# Patient Record
Sex: Female | Born: 1945
Health system: Southern US, Community
[De-identification: ages and names within clinical notes are randomized; demographics above are authoritative.]

## PROBLEM LIST (undated history)

## (undated) DIAGNOSIS — Z5189 Encounter for other specified aftercare: Secondary | ICD-10-CM

## (undated) DIAGNOSIS — J302 Other seasonal allergic rhinitis: Secondary | ICD-10-CM

## (undated) DIAGNOSIS — I350 Nonrheumatic aortic (valve) stenosis: Secondary | ICD-10-CM

## (undated) DIAGNOSIS — T7840XA Allergy, unspecified, initial encounter: Secondary | ICD-10-CM

## (undated) DIAGNOSIS — Z952 Presence of prosthetic heart valve: Secondary | ICD-10-CM

## (undated) DIAGNOSIS — M1990 Unspecified osteoarthritis, unspecified site: Secondary | ICD-10-CM

## (undated) DIAGNOSIS — I442 Atrioventricular block, complete: Secondary | ICD-10-CM

## (undated) DIAGNOSIS — G43909 Migraine, unspecified, not intractable, without status migrainosus: Secondary | ICD-10-CM

## (undated) DIAGNOSIS — E785 Hyperlipidemia, unspecified: Secondary | ICD-10-CM

## (undated) DIAGNOSIS — Q231 Congenital insufficiency of aortic valve: Secondary | ICD-10-CM

## (undated) DIAGNOSIS — M8580 Other specified disorders of bone density and structure, unspecified site: Secondary | ICD-10-CM

## (undated) DIAGNOSIS — R011 Cardiac murmur, unspecified: Secondary | ICD-10-CM

## (undated) DIAGNOSIS — Z950 Presence of cardiac pacemaker: Secondary | ICD-10-CM

## (undated) HISTORY — DX: Other seasonal allergic rhinitis: J30.2

## (undated) HISTORY — PX: POLYPECTOMY: SHX149

## (undated) HISTORY — DX: Congenital insufficiency of aortic valve: Q23.1

## (undated) HISTORY — DX: Other specified disorders of bone density and structure, unspecified site: M85.80

## (undated) HISTORY — DX: Atrioventricular block, complete: I44.2

## (undated) HISTORY — PX: INSERT / REPLACE / REMOVE PACEMAKER: SUR710

## (undated) HISTORY — PX: ABDOMINAL HYSTERECTOMY: SHX81

## (undated) HISTORY — DX: Hyperlipidemia, unspecified: E78.5

## (undated) HISTORY — DX: Allergy, unspecified, initial encounter: T78.40XA

## (undated) HISTORY — DX: Nonrheumatic aortic (valve) stenosis: I35.0

## (undated) HISTORY — DX: Encounter for other specified aftercare: Z51.89

## (undated) HISTORY — PX: KNEE ARTHROSCOPY: SHX127

---
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
## 2003-03-30 HISTORY — PX: COLONOSCOPY: SHX174

## 2003-06-13 LAB — HM COLONOSCOPY

## 2008-03-29 HISTORY — PX: CARDIAC VALVE REPLACEMENT: SHX585

## 2008-09-26 DIAGNOSIS — Q231 Congenital insufficiency of aortic valve: Secondary | ICD-10-CM

## 2008-09-26 HISTORY — DX: Congenital insufficiency of aortic valve: Q23.1

## 2008-10-15 DIAGNOSIS — Z952 Presence of prosthetic heart valve: Secondary | ICD-10-CM

## 2008-12-12 ENCOUNTER — Encounter (INDEPENDENT_AMBULATORY_CARE_PROVIDER_SITE_OTHER): Payer: Self-pay | Admitting: *Deleted

## 2009-01-15 ENCOUNTER — Encounter: Payer: Self-pay | Admitting: Internal Medicine

## 2009-02-05 ENCOUNTER — Encounter: Payer: Self-pay | Admitting: Internal Medicine

## 2009-02-19 ENCOUNTER — Ambulatory Visit: Payer: Self-pay | Admitting: Internal Medicine

## 2009-02-19 DIAGNOSIS — R011 Cardiac murmur, unspecified: Secondary | ICD-10-CM

## 2009-02-19 DIAGNOSIS — E785 Hyperlipidemia, unspecified: Secondary | ICD-10-CM

## 2009-07-02 ENCOUNTER — Encounter: Payer: Self-pay | Admitting: Internal Medicine

## 2010-01-14 ENCOUNTER — Ambulatory Visit: Payer: Self-pay | Admitting: Family Medicine

## 2010-01-14 ENCOUNTER — Telehealth (INDEPENDENT_AMBULATORY_CARE_PROVIDER_SITE_OTHER): Payer: Self-pay | Admitting: *Deleted

## 2010-01-23 ENCOUNTER — Ambulatory Visit: Payer: Self-pay | Admitting: Family Medicine

## 2010-02-16 ENCOUNTER — Ambulatory Visit: Payer: Self-pay | Admitting: Internal Medicine

## 2010-02-16 LAB — CONVERTED CEMR LAB
Nitrite: NEGATIVE
Urobilinogen, UA: 0.2

## 2010-02-23 LAB — CONVERTED CEMR LAB
BUN: 18 mg/dL (ref 6–23)
Basophils Relative: 0.5 % (ref 0.0–3.0)
Bilirubin, Direct: 0.1 mg/dL (ref 0.0–0.3)
Chloride: 105 meq/L (ref 96–112)
Cholesterol: 157 mg/dL (ref 0–200)
Eosinophils Relative: 3.6 % (ref 0.0–5.0)
HCT: 39.5 % (ref 36.0–46.0)
LDL Cholesterol: 101 mg/dL — ABNORMAL HIGH (ref 0–99)
Lymphs Abs: 2 10*3/uL (ref 0.7–4.0)
MCV: 92.4 fL (ref 78.0–100.0)
Monocytes Absolute: 0.4 10*3/uL (ref 0.1–1.0)
Neutro Abs: 3.3 10*3/uL (ref 1.4–7.7)
Platelets: 192 10*3/uL (ref 150.0–400.0)
Potassium: 4.4 meq/L (ref 3.5–5.1)
Total Bilirubin: 0.7 mg/dL (ref 0.3–1.2)
Total Protein: 6.8 g/dL (ref 6.0–8.3)
VLDL: 10 mg/dL (ref 0.0–40.0)
WBC: 6 10*3/uL (ref 4.5–10.5)

## 2010-02-24 ENCOUNTER — Ambulatory Visit: Payer: Self-pay | Admitting: Internal Medicine

## 2010-02-24 DIAGNOSIS — J301 Allergic rhinitis due to pollen: Secondary | ICD-10-CM | POA: Insufficient documentation

## 2010-04-28 NOTE — Letter (Signed)
Summary: [facility]   Imported By: Lanelle Bal 07/10/2009 09:11:17  _____________________________________________________________________  External Attachment:    Type:   Image     Comment:   External Document

## 2010-04-28 NOTE — Assessment & Plan Note (Signed)
Summary: pink eye//lch   Vital Signs:  Patient profile:   65 year old female Height:      56 inches Weight:      143.4 pounds BMI:     32.27 Temp:     98.1 degrees F oral Pulse rate:   60 / minute Pulse rhythm:   regular BP sitting:   132 / 84  (left arm) Cuff size:   regular  Vitals Entered By: Almeta Monas CMA Duncan Dull) (January 14, 2010 11:48 AM) CC: c/o pink eye in the right eye x1day  Vision Screening:Left eye w/o correction: 20 / 25 Right Eye w/o correction: 20 / 25 Both eyes w/o correction:  20/ 25       Vision Comments: blurred vision to the right eye 10/19  Vision Entered By: Almeta Monas CMA Duncan Dull) (January 14, 2010 12:29 PM)   History of Present Illness: Pt here c/o pink eye in R eye.  Pt is a special ED teacher.  + d/c , no contacts.  Pt with hx frequent pink eye and states it feels the same.  No other complaints.  Current Medications (verified): 1)  Vitamin D3 2000 Unit Caps (Cholecalciferol) .Marland Kitchen.. 1 By Mouth Once Daily 2)  Vitamin C 500 Mg Tabs (Ascorbic Acid) .Marland Kitchen.. 1 By Mouth Once Daily 3)  Lovaza 1 Gm Caps (Omega-3-Acid Ethyl Esters) .... 2 By Mouth Two Times A Day 4)  Aspirin 81 Mg Tabs (Aspirin) .Marland Kitchen.. 1 By Mouth Qd 5)  Metoprolol Succinate 25 Mg Xr24h-Tab (Metoprolol Succinate) .Marland Kitchen.. 1 By Mouth Once Daily 6)  Estradiol 0.5 Mg Tabs (Estradiol) .Marland Kitchen.. 1 By Mouth Once Daily 7)  Lipitor 40 Mg Tabs (Atorvastatin Calcium) .Marland Kitchen.. 1 By Mouth Once Daily 8)  Norethindrone Acetate 5 Mg Tabs (Norethindrone Acetate) .... Every 6 Months X 15 Days 9)  Vigamox 0.5 % Soln (Moxifloxacin Hcl) .Marland Kitchen.. 1 Gtt Three Times A Day For 7 Days  Allergies (verified): No Known Drug Allergies  Past History:  Past medical, surgical, family and social histories (including risk factors) reviewed for relevance to current acute and chronic problems.  Past Medical History: Reviewed history from 02/19/2009 and no changes required. MURMUR (ICD-785.2) AORTIC VALVE REPLACEMENT, HX OF (ICD-V43.3)  for ? congenital aortic valvular disease, Dr Earma Reading, Cardiology Hyperlipidemia ( TC 148, TG 52,HDL 49,LDL 89 on 02/05/2009)  Past Surgical History: Reviewed history from 02/19/2009 and no changes required. G2 P2  AORTIC VALVE REPLACEMENT, HX OF (ICD-V43.3) Tonsillectomy Colonscopy neg 2005  Family History: Reviewed history from 02/19/2009 and no changes required. Father: CAD, MI @ 61, ? colon polyps Mother: CAD, MI @ 68, CBAG @ 70, d @ 69 Siblings: none; strong FH CAD  Social History: Reviewed history from 02/19/2009 and no changes required. Occupation:Teacher Married Former Smoker:quit 2005 Alcohol use-no Regular exercise-no(S/P Cardiac Rehab)  Review of Systems      See HPI  Physical Exam  General:  Well-developed,well-nourished,in no acute distress; alert,appropriate and cooperative throughout examination Eyes:  R eye injected no swelling + tearing no fb Psych:  Cognition and judgment appear intact. Alert and cooperative with normal attention span and concentration. No apparent delusions, illusions, hallucinations   Impression & Recommendations:  Problem # 1:  CONJUNCTIVITIS, BACTERIAL (ICD-372.03)  Her updated medication list for this problem includes:    Vigamox 0.5 % Soln (Moxifloxacin hcl) .Marland Kitchen... 1 gtt three times a day for 7 days  Discussed treatment, and urged patient to wash hands carefully after touching face.   Complete Medication List: 1)  Vitamin D3 2000 Unit Caps (Cholecalciferol) .Marland Kitchen.. 1 by mouth once daily 2)  Vitamin C 500 Mg Tabs (Ascorbic acid) .Marland Kitchen.. 1 by mouth once daily 3)  Lovaza 1 Gm Caps (Omega-3-acid ethyl esters) .... 2 by mouth two times a day 4)  Aspirin 81 Mg Tabs (Aspirin) .Marland Kitchen.. 1 by mouth qd 5)  Metoprolol Succinate 25 Mg Xr24h-tab (Metoprolol succinate) .Marland Kitchen.. 1 by mouth once daily 6)  Estradiol 0.5 Mg Tabs (Estradiol) .Marland Kitchen.. 1 by mouth once daily 7)  Lipitor 40 Mg Tabs (Atorvastatin calcium) .Marland Kitchen.. 1 by mouth once daily 8)  Norethindrone  Acetate 5 Mg Tabs (Norethindrone acetate) .... Every 6 months x 15 days 9)  Vigamox 0.5 % Soln (Moxifloxacin hcl) .Marland Kitchen.. 1 gtt three times a day for 7 days  Patient Instructions: 1)  If no better by Friday, f/u optho Prescriptions: VIGAMOX 0.5 % SOLN (MOXIFLOXACIN HCL) 1 gtt three times a day for 7 days  #7 days x 0   Entered and Authorized by:   Loreen Freud DO   Signed by:   Loreen Freud DO on 01/14/2010   Method used:   Electronically to        [facility] (retail)       [address]       [place], [place]  [address]       Ph: [phone]       Fax: [phone]   RxID:   [id number]    Orders Added: 1)  Est. Patient Level III [[address]]

## 2010-04-28 NOTE — Assessment & Plan Note (Signed)
Summary: SINUS/KN   Vital Signs:  Patient profile:   65 year old female Weight:      142 pounds Temp:     97.9 degrees F oral BP sitting:   124 / 72  (left arm)  Vitals Entered By: Doristine Devoid CMA (January 23, 2010 11:46 AM) CC: sinus congestion and drainage along w/ ears feeling clogged    History of Present Illness: 65 yo woman here today for ? infxn.  was seen last week for pink eye.  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  sxs started Wednesday w/ nasal congestion.  quickly went to chest.  now cough is productive of 'very green' mucous.  no fevers.  bilateral ear congestion.  denies sore throat.  denies facial pain/pressure.  Allergies (verified): No Known Drug Allergies  Review of Systems      See HPI  Physical Exam  General:  Well-developed,well-nourished,in no acute distress; alert,appropriate and cooperative throughout examination Head:  Normocephalic and atraumatic without obvious abnormalities.  no TTP over sinuses Eyes:  no injxn or inflammation Ears:  TMs retracted bilaterally Nose:  + congestion Mouth:  + PND Neck:  no LAD Lungs:  coarse BS w/ ? faint crackles on R, CTA on L Heart:  II/VI SEM   Impression & Recommendations:  Problem # 1:  BRONCHITIS- ACUTE (ICD-466.0) Assessment New given productive cough will start Azithro.  reviewed supportive care and red flags that should prompt return.  Pt expresses understanding and is in agreement w/ this plan. Her updated medication list for this problem includes:    Azithromycin 250 Mg Tabs (Azithromycin) .Marland Kitchen... 2 by  mouth today and then 1 daily for 4 days    Tessalon 200 Mg Caps (Benzonatate) .Marland Kitchen... Take one capsule by mouth three times a day as needed for cough  Complete Medication List: 1)  Vitamin D3 2000 Unit Caps (Cholecalciferol) .Marland Kitchen.. 1 by mouth once daily 2)  Vitamin C 500 Mg Tabs (Ascorbic acid) .Marland Kitchen.. 1 by mouth once daily 3)  Lovaza 1 Gm Caps (Omega-3-acid ethyl esters) .... 2 by mouth two times a  day 4)  Aspirin 81 Mg Tabs (Aspirin) .Marland Kitchen.. 1 by mouth qd 5)  Metoprolol Succinate 25 Mg Xr24h-tab (Metoprolol succinate) .Marland Kitchen.. 1 by mouth once daily 6)  Estradiol 0.5 Mg Tabs (Estradiol) .Marland Kitchen.. 1 by mouth once daily 7)  Lipitor 40 Mg Tabs (Atorvastatin calcium) .Marland Kitchen.. 1 by mouth once daily 8)  Norethindrone Acetate 5 Mg Tabs (Norethindrone acetate) .... Every 6 months x 15 days 9)  Azithromycin 250 Mg Tabs (Azithromycin) .... 2 by  mouth today and then 1 daily for 4 days 10)  Tessalon 200 Mg Caps (Benzonatate) .... Take one capsule by mouth three times a day as needed for cough  Patient Instructions: 1)  Start the Zpack for the bronchitis 2)  If no improvement in the next 7 days or at any time worsening- call and we'll get a chest xray 3)  Use the tessalon pills as needed for cough 4)  Drink plenty of fluids 5)  Mucinex to thin your congestion and drainage 6)  Add a decongestant (sudafed or mucinex D) as needed for congestion 7)  Hang in there!!! Prescriptions: TESSALON 200 MG CAPS (BENZONATATE) Take one capsule by mouth three times a day as needed for cough  #60 x 0   Entered and Authorized by:   Neena Rhymes MD   Signed by:   Neena Rhymes MD on 01/23/2010   Method used:  Electronically to        [facility] (retail)       [address]       [place], [place]  [address]       Ph: [phone]       Fax: [phone]   RxID:   [id number] AZITHROMYCIN 250 MG  TABS (AZITHROMYCIN) 2 by  mouth today and then 1 daily for 4 days  #6 x 0   Entered and Authorized by:   Neena Rhymes MD   Signed by:   Neena Rhymes MD on 01/23/2010   Method used:   Electronically to        [facility] (retail)       [address]       [place], [place]  [address]       Ph: [phone]       Fax: [phone]   RxID:   [id number]    Orders Added: 1)  Est. Patient Level III [884[address]]

## 2010-04-28 NOTE — Assessment & Plan Note (Signed)
Summary: cpx//lch   Vital Signs:  Patient profile:   65 year old female Height:      62.75 inches Weight:      142.8 pounds BMI:     25.59 Temp:     97.7 degrees F oral Pulse rate:   60 / minute Resp:     14 per minute BP sitting:   128 / 80  (left arm) Cuff size:   large  Vitals Entered By: Shonna Chock CMA (February 24, 2010 1:10 PM) CC: CPX and discuss labs (copy given) , Lipid Management   Primary Care Provider:  Alfonse Flavors  CC:  CPX and discuss labs (copy given)  and Lipid Management.  History of Present Illness:      Here for Medicare AWV: 1.Risk factors based on Past M, S, F history:Dyslipidemia;Beta blocker prophylaxis  S/P AV replacement (chart updated) 2.Physical Activities: no regular program  3.Depression/mood: no issues 4.Hearing: whisper heard @ 6 ft 5.ADL's: no limitations 6.Fall Risk: no issues 7.Home Safety: no risks 8.Height, weight, &visual acuity:wall  chart read @ 6 ft w/o lenses 9.Counseling: POA & Living Will in place 10.Labs ordered based on risk factors: labs reviewed 11.Referral Coordination: none requested 12.Care Plan: see Instructions 13. Cognitive Assessment: Oriented x 3 ;  memory & recall  ; "WORLD"  spelled backwards ; mood & affect intact.        Hyperlipidemia Follow-Up: The patient denies muscle aches, GI upset, abdominal pain, flushing, itching, constipation, diarrhea, and fatigue.  The patient denies the following symptoms: chest pain/pressure, exercise intolerance, dypsnea, palpitations, syncope, and pedal edema.  Compliance with medications (by patient report) has been near 100%.  Dietary compliance has been good.  Adjunctive measures currently used by the patient include ASA and fish oil supplements.    Lipid Management History:      Positive NCEP/ATP III risk factors include female age 24 years old or older.  Negative NCEP/ATP III risk factors include no history of early menopause without estrogen hormone replacement, non-diabetic, no  family history for ischemic heart disease, non-tobacco-user status, non-hypertensive, no ASHD (atherosclerotic heart disease), no prior stroke/TIA, no peripheral vascular disease, and no history of aortic aneurysm.     Preventive Screening-Counseling & Management  Alcohol-Tobacco     Alcohol drinks/day: 0     Smoking Status: quit     Packs/Day: < 5 cig / day     Year Started: 1965     Year Quit: 2005  Caffeine-Diet-Exercise     Caffeine use/day: 1 cup/day     Diet Comments: no specific diet     Does Patient Exercise: no  Hep-HIV-STD-Contraception     Dental Visit-last 6 months yes     Sun Exposure-Excessive: no  Safety-Violence-Falls     Seat Belt Use: yes     Smoke Detectors: yes      Blood Transfusions:  yes and ? with AV repacement 2010.        Travel History:  [place] 1996.    Allergies (verified): No Known Drug Allergies  Past History:  Past Medical History: AORTIC VALVE REPLACEMENT, PMH of  (ICD-V43.3) for ? congenital aortic valvular disease, Dr Earma Reading, Cardiology Hyperlipidemia: [place]ham Study DL goal = < 161. ( TC 148, TG 52,HDL 49,LDL 89 on 02/05/2009)  Past Surgical History: G2 P2  AORTIC VALVE REPLACEMENT, PMH  OF (ICD-V43.3) Tonsillectomy Colonscopy negative  2005  Family History: Father: CAD, MI @ 26, ? colon polyps Mother: CAD, MI @ 33, CBAG @ 31, died  @  97 Siblings: none; strong FH CAD but no premature MI  Social History: Occupation:Teacher Married Former Smoker:quit 2005 Alcohol use-no Regular exercise-no Packs/Day:  < 5 cig / day Caffeine use/day:  1 cup/day Dental Care w/in 6 mos.:  yes Sun Exposure-Excessive:  no Seat Belt Use:  yes Blood Transfusions:  yes, ? with AV repacement 2010  Review of Systems  The patient denies anorexia, fever, weight loss, weight gain, vision loss, decreased hearing, hoarseness, abdominal pain, melena, hematochezia, severe indigestion/heartburn, hematuria, incontinence, suspicious skin lesions,  depression, unusual weight change, abnormal bleeding, enlarged lymph nodes, and angioedema.   Resp:  Denies shortness of breath, sputum productive, and wheezing; Cough due to PNDrainage.  Physical Exam  General:  well-nourished;alert,appropriate and cooperative throughout examination Head:  Normocephalic and atraumatic without obvious abnormalities. Eyes:  No corneal or conjunctival inflammation noted.  Perrla. Funduscopic exam benign, without hemorrhages, exudates or papilledema.  Ears:  External ear exam shows no significant lesions or deformities.  Otoscopic examination reveals clear canals, tympanic membranes are intact bilaterally without bulging, retraction, inflammation or discharge. Hearing is grossly normal bilaterally. Nose:  External nasal examination shows no deformity or inflammation. Nasal mucosa are pink and moist without lesions or exudates. Mouth:  Oral mucosa and oropharynx without lesions or exudates.  Teeth in good repair. Neck:  No deformities, masses, or tenderness noted. Lungs:  Normal respiratory effort, chest expands symmetrically. Lungs are clear to auscultation, no crackles or wheezes. Heart:  normal rate, regular rhythm, no gallop, no rub, no JVD, no HJR, and grade  1.5 /6 systolic murmur, loudest @ R base.   Abdomen:  Bowel sounds positive,abdomen soft and non-tender without masses, organomegaly or hernias noted. Aorta palpable w/o AAA Genitalia:  Dr Dorma Russell, Gyn [facility] Msk:  No deformity or scoliosis noted of thoracic or lumbar spine.   Pulses:  R and L carotid,radial,dorsalis pedis and posterior tibial pulses are full and equal bilaterally Extremities:  No clubbing, cyanosis, edema. Minimal OA finger changes Neurologic:  alert & oriented X3 and DTRs symmetrical and normal.   Skin:  Intact without suspicious lesions or rashes Cervical Nodes:  No lymphadenopathy noted Axillary Nodes:  No palpable lymphadenopathy Psych:  memory intact for recent and remote, normally  interactive, and good eye contact.     Impression & Recommendations:  Problem # 1:  PHYSICAL EXAMINATION (ICD-V70.0)  Orders: Welcome to Medicare, Physical [phone])  Problem # 2:  HYPERLIPIDEMIA (ICD-272.4)  Her updated medication list for this problem includes:    Lovaza 1 Gm Caps (Omega-3-acid ethyl esters) .Marland Kitchen... 2 by mouth two times a day    Lipitor 40 Mg Tabs (Atorvastatin calcium) .Marland Kitchen... 1 by mouth once daily  Problem # 3:  AORTIC VALVE REPLACEMENT, HX OF (ICD-V43.3)  Problem # 4:  ALLERGIC RHINITIS, SEASONAL (ICD-477.0)  Complete Medication List: 1)  Vitamin D3 2000 Unit Caps (Cholecalciferol) .Marland Kitchen.. 1 by mouth once daily 2)  Vitamin C 500 Mg Tabs (Ascorbic acid) .Marland Kitchen.. 1 by mouth once daily 3)  Lovaza 1 Gm Caps (Omega-3-acid ethyl esters) .... 2 by mouth two times a day 4)  Aspirin 81 Mg Tabs (Aspirin) .Marland Kitchen.. 1 by mouth qd 5)  Metoprolol Succinate 25 Mg Xr24h-tab (Metoprolol succinate) .... 1/2  by mouth once daily 6)  Estradiol 0.5 Mg Tabs (Estradiol) .Marland Kitchen.. 1 by mouth once daily 7)  Lipitor 40 Mg Tabs (Atorvastatin calcium) .Marland Kitchen.. 1 by mouth once daily 8)  Norethindrone Acetate 5 Mg Tabs (Norethindrone acetate) .... Every 6 months x 15 days 9)  One A Day/womens 50+  .Marland Kitchen.. 1 by mouth once daily 10)  Amoxicillin 500  .Marland Kitchen.. 4 pills  60 min before dental  procedure 11)  Fluticasone Propionate 50 Mcg/act Susp (Fluticasone propionate) .Marland Kitchen.. 1 spray two times a day as needed  Lipid Assessment/Plan:      Based on NCEP/ATP III, the patient's risk factor category is "0-1 risk factors".  The patient's lipid goals are as follows: Total cholesterol goal is 200; LDL cholesterol goal is 160; HDL cholesterol goal is 40; Triglyceride goal is 150.    Patient Instructions: 1)  Please have [facility] lab & imaging data  sent for chart inclusion.Neti pot once daily as needed for nasal congestion. 2)  It is important that you exercise regularly at least 20 minutes 5 times a week. If you develop chest pain, have  severe difficulty breathing, or feel very tired , stop exercising immediately and seek medical attention. Consider a [facility] [id number])  in 2012 to optimally assess risk (272.4, V17.3). 3)  Take antibiotics before any dental, gastrointestinal, or genitourinary procedures to prevent damage to your heart valves from an infection.  Prescriptions: AMOXICILLIN  500 4 pills  60 min before dental  procedure  #20 x 5   Entered and Authorized by:   Marga Melnick MD   Signed by:   Marga Melnick MD on 02/24/2010   Method used:   Print then Give to Patient   RxID:   [id number] FLUTICASONE PROPIONATE 50 MCG/ACT SUSP (FLUTICASONE PROPIONATE) 1 spray two times a day as needed  #1 x 11   Entered and Authorized by:   Marga Melnick MD   Signed by:   Marga Melnick MD on 02/24/2010   Method used:   Print then Give to Patient   RxID:   [id number]    Orders Added: 1)  Welcome to Medicare, Physical [G0402] 2)  Est. Patient Level III [[address]]  Appended Document: cpx//lch Flu Vaccine Consent Questions     Do you have a history of severe allergic reactions to this vaccine? no    Any prior history of allergic reactions to egg and/or gelatin? no    Do you have a sensitivity to the preservative Thimersol? no    Do you have a past history of Guillan-Barre Syndrome? no    Do you currently have an acute febrile illness? no    Have you ever had a severe reaction to latex? no    Vaccine information given and explained to patient? yes    Are you currently pregnant? no    Lot Number:[id number]   Exp Date:09/26/2010   Site Given  Left Deltoid IM

## 2010-04-28 NOTE — Progress Notes (Signed)
Summary: Lab Work  Phone Note Call from Patient Call back at [employer] [phone]   Caller: Patient Summary of Call: Patient would like to have lab work done prior to CPX on 11.29.11. I see no prior lab work ordered in last OV. Please advise lab work and codes. Thanks! Initial call taken by: Harold Barban,  January 14, 2010 9:49 AM  Follow-up for Phone Call        Lipid,Hep,BMP,CBCD,TSH,Stool Cards, Udip V70.0/272.4/995.20 Follow-up by: Shonna Chock CMA,  January 14, 2010 11:10 AM  Additional Follow-up for Phone Call Additional follow up Details #1::        Added to lab appt.  Additional Follow-up by: Harold Barban,  January 14, 2010 2:08 PM

## 2011-03-02 ENCOUNTER — Ambulatory Visit (INDEPENDENT_AMBULATORY_CARE_PROVIDER_SITE_OTHER): Payer: BC Managed Care – PPO

## 2011-03-02 DIAGNOSIS — Z23 Encounter for immunization: Secondary | ICD-10-CM

## 2011-04-05 ENCOUNTER — Encounter: Payer: Self-pay | Admitting: Internal Medicine

## 2011-05-12 ENCOUNTER — Encounter: Payer: Self-pay | Admitting: Internal Medicine

## 2011-05-12 ENCOUNTER — Ambulatory Visit (INDEPENDENT_AMBULATORY_CARE_PROVIDER_SITE_OTHER): Payer: BC Managed Care – PPO | Admitting: Internal Medicine

## 2011-05-12 DIAGNOSIS — Z8249 Family history of ischemic heart disease and other diseases of the circulatory system: Secondary | ICD-10-CM

## 2011-05-12 DIAGNOSIS — E785 Hyperlipidemia, unspecified: Secondary | ICD-10-CM

## 2011-05-12 DIAGNOSIS — J310 Chronic rhinitis: Secondary | ICD-10-CM

## 2011-05-12 DIAGNOSIS — Z0000 Encounter for general adult medical examination without abnormal findings: Secondary | ICD-10-CM

## 2011-05-12 DIAGNOSIS — J209 Acute bronchitis, unspecified: Secondary | ICD-10-CM

## 2011-05-12 DIAGNOSIS — Z954 Presence of other heart-valve replacement: Secondary | ICD-10-CM

## 2011-05-12 LAB — CBC WITH DIFFERENTIAL/PLATELET
Basophils Relative: 0.6 % (ref 0.0–3.0)
Eosinophils Relative: 4.7 % (ref 0.0–5.0)
HCT: 40.2 % (ref 36.0–46.0)
Lymphs Abs: 2.1 10*3/uL (ref 0.7–4.0)
MCV: 92.8 fl (ref 78.0–100.0)
Monocytes Absolute: 0.4 10*3/uL (ref 0.1–1.0)
Platelets: 166 10*3/uL (ref 150.0–400.0)
WBC: 6.2 10*3/uL (ref 4.5–10.5)

## 2011-05-12 LAB — HEPATIC FUNCTION PANEL
ALT: 18 U/L (ref 0–35)
Albumin: 3.6 g/dL (ref 3.5–5.2)
Total Bilirubin: 0.6 mg/dL (ref 0.3–1.2)
Total Protein: 6.9 g/dL (ref 6.0–8.3)

## 2011-05-12 LAB — BASIC METABOLIC PANEL
BUN: 17 mg/dL (ref 6–23)
Chloride: 108 mEq/L (ref 96–112)
Potassium: 4.4 mEq/L (ref 3.5–5.1)

## 2011-05-12 LAB — TSH: TSH: 1.54 u[IU]/mL (ref 0.35–5.50)

## 2011-05-12 MED ORDER — AZITHROMYCIN 250 MG PO TABS: ORAL_TABLET | ORAL | Status: AC

## 2011-05-12 MED ORDER — FLUTICASONE PROPIONATE 50 MCG/ACT NA SUSP: 1.0000 | Freq: Two times a day (BID) | NASAL | Status: DC | PRN

## 2011-05-12 NOTE — Progress Notes (Signed)
Subjective:    Patient ID: Angel Waller, female    DOB: 1945/05/14, 66 y.o.   MRN: [id number]  HPI  Angel Waller  is here for a physical;acute issues include ongoing RTI symptoms for 3 weeks.      Review of Systems She's had some right knee pain since October 2012 related to wearing a specific pair of shoes. She has  taken Advil which was associated with some mild palpitations. She denies any frontal headache, facial pain, nasal purulence, sore throat, or dental pain. She has intermittent yellow sputum. Patient reports no significant  vision/ hearing  changes, adenopathy,fever, weight change,  persistant / recurrent hoarseness , swallowing issues, chest pain,palpitations,edema, hemoptysis, dyspnea( rest/ exertional/paroxysmal nocturnal), gastrointestinal bleeding(melena, rectal bleeding), abdominal pain, significant heartburn,  bowel changes,GU symptoms(dysuria, hematuria,pyuria, incontinence), Gyn symptoms(abnormal  bleeding , pain),  syncope, focal weakness, memory loss,numbness & tingling, skin/hair /nail changes,abnormal bruising or bleeding, anxiety,or depression.     Objective:   Physical Exam Gen.: Thin but healthy and well-nourished in appearance. Alert, appropriate and cooperative throughout exam. Head: Normocephalic without obvious abnormalities Eyes: No corneal or conjunctival inflammation noted. Pupils equal round reactive to light and accommodation. Fundal exam is benign without hemorrhages, exudate, papilledema. Extraocular motion intact. Vision grossly normal. Ears: External  ear exam reveals no significant lesions or deformities. Canals : deep wax on L ; R clear .Hearing is grossly normal bilaterally. Nose: External nasal exam reveals no deformity or inflammation. Nasal mucosa are pink and moist. No lesions or exudates noted.  Mouth: Oral mucosa and oropharynx reveal no lesions or exudates. Teeth in good repair. Neck: No deformities, masses, or tenderness noted. Range of motion &  Thyroid small. Lungs: Normal respiratory effort; chest expands symmetrically. Lungs are clear to auscultation without rales, wheezes, or increased work of breathing. Heart: Normal rate and rhythm. Normal S1 and S2. No gallop, click, or rub.  Grade 1/6 systolic murmur at the right base. Minor diastolic "whiff"of AR  also. Abdomen: Bowel sounds normal; abdomen soft and nontender. No masses, organomegaly or hernias noted. Genitalia: Dr Liberty Handy, [facility]   .                                                                                   Musculoskeletal/extremities: No deformity or scoliosis noted of  the thoracic or lumbar spine. No clubbing, cyanosis, edema, or deformity noted. Range of motion  normal .Tone & strength  normal.Joints :minor DIP changes; crepitus of both knees greater on the right. Possible small effusion right knee Nail health  good. Vascular: Carotid, radial artery, dorsalis pedis and  posterior tibial pulses are full and equal. No bruits present. Neurologic: Alert and oriented x3. Deep tendon reflexes symmetrical and normal.         Skin: Intact without suspicious lesions or rashes. Lymph: No cervical, axillary lymphadenopathy present. Psych: Mood and affect are normal. Normally interactive  Assessment & Plan:  #1 comprehensive physical exam; no acute findings except R knee #2 see Problem List with Assessments & Recommendations  #3 bronchitis acute without bronchospasm  #4 degenerative joint changes of the knees with possible right effusion. Palpitations with nonsteroidal(Advil) Plan: see Orders

## 2011-05-12 NOTE — Patient Instructions (Addendum)
Preventive Health Care: Exercise  30-45  minutes a day, 3-4 days a week. Walking is especially valuable in preventing Osteoporosis. Eat a low-fat diet with lots of fruits and vegetables, up to 7-9 servings per day. Consume less than 30 grams of sugar per day from foods & drinks with High Fructose Corn Syrup as #1,2,3 or #4 on label. Plain Mucinex for thick secretions ;force NON dairy fluids. Use a Neti pot daily as needed for sinus congestion .Nasal cleansing in the shower as discussed. Make sure that all residual soap is removed to prevent irritation. Generic Flonase1 spray in each nostril twice a day as needed. Use the "crossover" technique as discussed   Please do not use Q-tips as we discussed. Should wax build up occur, please put 2-3 drops of mineral oil in the ear at night and cover the canal with a  cotton ball. In the morning fill the canal with hydrogen peroxide & leave  for 10-15 minutes. Following this shower and use the thinnest washrag available to wick out the wax.

## 2011-05-13 LAB — NMR, LIPOPROFILE

## 2011-05-23 ENCOUNTER — Encounter: Payer: Self-pay | Admitting: Internal Medicine

## 2011-05-31 ENCOUNTER — Encounter: Payer: Self-pay | Admitting: Internal Medicine

## 2011-08-10 ENCOUNTER — Encounter: Payer: Self-pay | Admitting: Internal Medicine

## 2012-02-29 ENCOUNTER — Inpatient Hospital Stay (HOSPITAL_COMMUNITY)
Admission: EM | Admit: 2012-02-29 | Discharge: 2012-03-03 | DRG: 116 | Disposition: A | Discharge status: Home / Self Care | Payer: BC Managed Care – PPO | Attending: Internal Medicine | Admitting: Internal Medicine

## 2012-02-29 ENCOUNTER — Emergency Department (HOSPITAL_COMMUNITY): Payer: BC Managed Care – PPO

## 2012-02-29 ENCOUNTER — Encounter (HOSPITAL_COMMUNITY): Payer: Self-pay | Admitting: *Deleted

## 2012-02-29 DIAGNOSIS — Z952 Presence of prosthetic heart valve: Secondary | ICD-10-CM

## 2012-02-29 DIAGNOSIS — W19XXXA Unspecified fall, initial encounter: Secondary | ICD-10-CM | POA: Diagnosis present

## 2012-02-29 DIAGNOSIS — I455 Other specified heart block: Secondary | ICD-10-CM

## 2012-02-29 DIAGNOSIS — Z7982 Long term (current) use of aspirin: Secondary | ICD-10-CM

## 2012-02-29 DIAGNOSIS — I442 Atrioventricular block, complete: Secondary | ICD-10-CM

## 2012-02-29 DIAGNOSIS — S0003XA Contusion of scalp, initial encounter: Secondary | ICD-10-CM | POA: Diagnosis present

## 2012-02-29 DIAGNOSIS — R55 Syncope and collapse: Secondary | ICD-10-CM | POA: Diagnosis present

## 2012-02-29 DIAGNOSIS — Z79899 Other long term (current) drug therapy: Secondary | ICD-10-CM

## 2012-02-29 DIAGNOSIS — E785 Hyperlipidemia, unspecified: Secondary | ICD-10-CM | POA: Diagnosis present

## 2012-02-29 DIAGNOSIS — Z954 Presence of other heart-valve replacement: Secondary | ICD-10-CM

## 2012-02-29 DIAGNOSIS — Z23 Encounter for immunization: Secondary | ICD-10-CM

## 2012-02-29 DIAGNOSIS — G43909 Migraine, unspecified, not intractable, without status migrainosus: Secondary | ICD-10-CM | POA: Diagnosis present

## 2012-02-29 DIAGNOSIS — I441 Atrioventricular block, second degree: Principal | ICD-10-CM | POA: Diagnosis present

## 2012-02-29 DIAGNOSIS — I4430 Unspecified atrioventricular block: Secondary | ICD-10-CM

## 2012-02-29 DIAGNOSIS — S0180XA Unspecified open wound of other part of head, initial encounter: Secondary | ICD-10-CM | POA: Diagnosis present

## 2012-02-29 DIAGNOSIS — E876 Hypokalemia: Secondary | ICD-10-CM | POA: Diagnosis present

## 2012-02-29 HISTORY — DX: Cardiac murmur, unspecified: R01.1

## 2012-02-29 HISTORY — DX: Presence of prosthetic heart valve: Z95.2

## 2012-02-29 HISTORY — DX: Migraine, unspecified, not intractable, without status migrainosus: G43.909

## 2012-02-29 HISTORY — DX: Unspecified osteoarthritis, unspecified site: M19.90

## 2012-02-29 LAB — URINALYSIS, ROUTINE W REFLEX MICROSCOPIC
Glucose, UA: NEGATIVE mg/dL
Hgb urine dipstick: NEGATIVE
Ketones, ur: NEGATIVE mg/dL
Protein, ur: NEGATIVE mg/dL
Urobilinogen, UA: 0.2 mg/dL (ref 0.0–1.0)

## 2012-02-29 LAB — CBC
HCT: 40.6 % (ref 36.0–46.0)
Hemoglobin: 13.7 g/dL (ref 12.0–15.0)
MCH: 30.8 pg (ref 26.0–34.0)
MCH: 30.6 pg (ref 26.0–34.0)
MCHC: 33.6 g/dL (ref 30.0–36.0)
MCHC: 33.7 g/dL (ref 30.0–36.0)
MCV: 91.7 fL (ref 78.0–100.0)
Platelets: 186 10*3/uL (ref 150–400)
RBC: 4.84 MIL/uL (ref 3.87–5.11)
RBC: 4.48 MIL/uL (ref 3.87–5.11)

## 2012-02-29 LAB — CREATININE, SERUM: Creatinine, Ser: 0.71 mg/dL (ref 0.50–1.10)

## 2012-02-29 LAB — BASIC METABOLIC PANEL
CO2: 27 mEq/L (ref 19–32)
Calcium: 9.5 mg/dL (ref 8.4–10.5)
Creatinine, Ser: 0.69 mg/dL (ref 0.50–1.10)
GFR calc non Af Amer: 88 mL/min — ABNORMAL LOW (ref >90)
Sodium: 138 mEq/L (ref 135–145)

## 2012-02-29 LAB — URINE MICROSCOPIC-ADD ON

## 2012-02-29 LAB — TROPONIN I: Troponin I: <0.3 ng/mL (ref <0.30)

## 2012-02-29 MED ORDER — ACETAMINOPHEN 650 MG RE SUPP: 650.0000 mg | Freq: Four times a day (QID) | RECTAL | Status: DC | PRN

## 2012-02-29 MED ORDER — ASPIRIN 81 MG PO TABS: 81.0000 mg | ORAL_TABLET | Freq: Every day | ORAL | Status: DC

## 2012-02-29 MED ORDER — TETANUS-DIPHTH-ACELL PERTUSSIS 5-2.5-18.5 LF-MCG/0.5 IM SUSP
0.5000 mL | Freq: Once | INTRAMUSCULAR | Status: AC
  Administered 2012-02-29: 0.5 mL via INTRAMUSCULAR
  Filled 2012-02-29: qty 0.5

## 2012-02-29 MED ORDER — ESTRADIOL 1 MG PO TABS
0.5000 mg | ORAL_TABLET | Freq: Every day | ORAL | Status: DC
  Administered 2012-03-01 – 2012-03-03 (×3): 0.5 mg via ORAL
  Filled 2012-02-29 (×3): qty 0.5

## 2012-02-29 MED ORDER — ONDANSETRON HCL 4 MG PO TABS: 4.0000 mg | ORAL_TABLET | Freq: Four times a day (QID) | ORAL | Status: DC | PRN

## 2012-02-29 MED ORDER — SODIUM CHLORIDE 0.9 % IV BOLUS (SEPSIS)
1000.0000 mL | Freq: Once | INTRAVENOUS | Status: AC
  Administered 2012-02-29: 1000 mL via INTRAVENOUS

## 2012-02-29 MED ORDER — ONDANSETRON HCL 4 MG/2ML IJ SOLN: 4.0000 mg | Freq: Three times a day (TID) | INTRAMUSCULAR | Status: DC | PRN

## 2012-02-29 MED ORDER — ONDANSETRON HCL 4 MG/2ML IJ SOLN: 4.0000 mg | Freq: Four times a day (QID) | INTRAMUSCULAR | Status: DC | PRN

## 2012-02-29 MED ORDER — ATORVASTATIN CALCIUM 40 MG PO TABS
40.0000 mg | ORAL_TABLET | Freq: Every day | ORAL | Status: DC
  Administered 2012-02-29 – 2012-03-02 (×3): 40 mg via ORAL
  Filled 2012-02-29 (×6): qty 1

## 2012-02-29 MED ORDER — POTASSIUM CHLORIDE CRYS ER 20 MEQ PO TBCR
40.0000 meq | EXTENDED_RELEASE_TABLET | Freq: Once | ORAL | Status: AC
  Administered 2012-02-29: 40 meq via ORAL
  Filled 2012-02-29: qty 2

## 2012-02-29 MED ORDER — METOPROLOL SUCCINATE 12.5 MG HALF TABLET
12.5000 mg | ORAL_TABLET | Freq: Two times a day (BID) | ORAL | Status: DC
  Administered 2012-02-29: 12.5 mg via ORAL
  Filled 2012-02-29 (×3): qty 1

## 2012-02-29 MED ORDER — SODIUM CHLORIDE 0.9 % IV SOLN: INTRAVENOUS | Status: DC

## 2012-02-29 MED ORDER — ACETAMINOPHEN 325 MG PO TABS: 650.0000 mg | ORAL_TABLET | Freq: Four times a day (QID) | ORAL | Status: DC | PRN

## 2012-02-29 MED ORDER — ENOXAPARIN SODIUM 40 MG/0.4ML ~~LOC~~ SOLN
40.0000 mg | SUBCUTANEOUS | Status: DC
  Administered 2012-02-29: 40 mg via SUBCUTANEOUS
  Filled 2012-02-29 (×2): qty 0.4

## 2012-02-29 MED ORDER — ASPIRIN EC 81 MG PO TBEC
81.0000 mg | DELAYED_RELEASE_TABLET | Freq: Every day | ORAL | Status: DC
  Administered 2012-03-01 – 2012-03-03 (×3): 81 mg via ORAL
  Filled 2012-02-29 (×3): qty 1

## 2012-02-29 MED ORDER — SODIUM CHLORIDE 0.9 % IV SOLN
INTRAVENOUS | Status: DC
  Administered 2012-02-29 – 2012-03-01 (×2): via INTRAVENOUS

## 2012-02-29 MED ORDER — BISACODYL 5 MG PO TBEC
5.0000 mg | DELAYED_RELEASE_TABLET | Freq: Every day | ORAL | Status: DC | PRN
  Filled 2012-02-29: qty 1

## 2012-02-29 MED ORDER — PNEUMOCOCCAL VAC POLYVALENT 25 MCG/0.5ML IJ INJ
0.5000 mL | INJECTION | INTRAMUSCULAR | Status: AC
  Filled 2012-02-29: qty 0.5

## 2012-02-29 NOTE — ED Notes (Signed)
Patient reported to be sitting at table, became dizzy, passed out.  She hit her head on left side.  Patient had similar episode 1 week ago.  Patient cbg was 90.  Patient has hx of aortic valve replacement.  Patient is on lsb upon arrival.  She denies pain at present.   Alert and oriented

## 2012-02-29 NOTE — ED Notes (Signed)
Pt alert and talking to family members.  Remains in NSR

## 2012-02-29 NOTE — ED Notes (Signed)
Talking with family

## 2012-02-29 NOTE — ED Provider Notes (Signed)
Angel Waller is a 66 y.o. female transferred to CDU from Pod A. Signout from Dr. Karma Ganja as follows: Patient with syncopal episode this a.m. head trauma CT head pending she has a laceration to the right orbit but will need laceration repair. Patient has aortic valve repair and will need admission for multiple syncopal episodes pending lack repair and imaging results.  Pt seen and examined at the bedside she is resting comfortably, eating lunch. Pupils are equal round and reactive to light, there is a periorbital ecchymoses to the left eye, orbital rim has no crepitus, to send her full thickness laceration to left temple. Cervical spine shows no midline tenderness to palpation or step-offs, full range of motion without pain. Lung sounds are clear to auscultation bilaterally, heart is regular rate and rhythm with no murmurs rubs or gallops appreciated, abdominal exam is benign with no tenderness to palpation or masses.  Results for orders placed during the hospital encounter of 02/29/12  CBC      Component Value Range   WBC 7.8  4.0 - 10.5 K/uL   RBC 4.84  3.87 - 5.11 MIL/uL   Hemoglobin 14.9  12.0 - 15.0 g/dL   HCT 62.1  30.8 - 65.7 %   MCV 91.7  78.0 - 100.0 fL   MCH 30.8  26.0 - 34.0 pg   MCHC 33.6  30.0 - 36.0 g/dL   RDW 84.6  96.2 - 95.2 %   Platelets 186  150 - 400 K/uL   Ct Head Wo Contrast  02/29/2012  *RADIOLOGY REPORT*  Clinical Data: Dizziness followed by trauma  CT HEAD WITHOUT CONTRAST  Technique:  Contiguous axial images were obtained from the base of the skull through the vertex without contrast. Study was performed within 24 hours of patient arrival at the emergency department.  Comparison: None.  Findings:  Ventricles are normal in size and configuration.  There is no mass, hemorrhage, extra-axial fluid collection, or midline shift. No evidence of acute infarct.  The gray-white compartments are normal.  Bony calvarium appears intact.  The mastoid air cells are clear. There is mucosal  thickening in the sphenoid sinus region.  Impression:  Mild sphenoid sinus disease.  Study otherwise unremarkable.   Original Report Authenticated By: Bretta Bang, M.D.    LACERATION REPAIR Performed by: Wynetta Emery Authorized by: Wynetta Emery Consent: Verbal consent obtained. Risks and benefits: risks, benefits and alternatives were discussed Consent given by: patient Patient identity confirmed: Wrist band  Prepped and Draped in normal sterile fashion  Tetanus: Updated  Laceration Location: Left temple  Laceration Length: 2 cm  Anesthesia: Local   Local anesthetic: 2% with epinephrine  Anesthetic total: 3 ml  Irrigation method: syringe  Amount of cleaning: copious   Wound explored to depth in good light on a bloodless field with no foreign bodies seen or palpated.   Skin closure: 6-0 polypropylene   Number of sutures: 7   Technique: Simple interrupted   Patient tolerance: Patient tolerated the procedure well with no immediate complications.     Patient will be admitted to [facility] Dr. Renold Don for syncope. She will go to a telemetry bed [facility] [address] PA-C 02/29/12 1608

## 2012-02-29 NOTE — ED Notes (Signed)
PA at bedside.

## 2012-02-29 NOTE — ED Notes (Signed)
Pt. Removed from the LSB. Denies any pain or discomfort.  Denies any numbness to extremeties.  Cervical Collar maintained

## 2012-02-29 NOTE — H&P (Signed)
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 She states she had lost consciousness-per witnesses for about 2 seconds, fallen to the concrete floor and sustained a laceration over her left upper eyelid. There was no jerking activity or incontinence reported .She denies chest pain, palpitations and no focal weakness. She states that about a week or so ago she had a another syncopal episode at a football game-reports that she had some palpitations prior to that episode. For the last episode she reports she was checked out by EMS but did not see an M.D. In the ED today she had a CT scan of her head which did not show any acute intracranial findings, EKG showed normal sinus rhythm at 66 with no acute ischemic findings. She is admitted for further evaluation and management.  Review of Systems: The patient denies anorexia, fever, weight loss,, vision loss, decreased hearing, hoarseness, chest pain, syncope, dyspnea on exertion, peripheral edema, balance deficits, hemoptysis, abdominal pain, melena, hematochezia, severe indigestion/heartburn, hematuria, incontinence, genital sores, muscle weakness, suspicious skin lesions, transient blindness, difficulty walking, depression, unusual weight change, abnormal bleeding, enlarged lymph nodes, angioedema, and breast masses.    Past Medical History  Diagnosis Date  . Seasonal allergies   . Hyperlipemia   . S/P AVR (aortic valve replacement)    Past Surgical History  Procedure Date  . G 2 p 2   . Aortic valve replacement   . Colonoscopy 2005    negative  .  Tonsillectomy and adenoidectomy    Social History:  reports that she quit smoking about 8 years ago. She does not have any smokeless tobacco history on file. She reports that she does not drink alcohol or use illicit drugs. where does patient live--home Can patient participate in ADLs-yes  No Known Allergies  Family History  Problem Relation Age of Onset  . Heart disease Mother     MI @ 44; CBAG  . Heart disease Father     MI @ 78  . Colon polyps Father   . Heart disease      in both M & P uncles & aunts; no premature MI    Prior to Admission medications   Medication Sig Start Date End Date Taking? Authorizing Provider  amoxicillin (AMOXIL) 500 MG capsule Take 500 mg by mouth. 4 pills 60 min prior to dental procedure   Yes Historical Provider, MD  aspirin 81 MG tablet Take 81 mg by mouth daily.   Yes Historical Provider, MD  atorvastatin (LIPITOR) 40 MG tablet Take 40 mg by mouth daily.   Yes Historical Provider, MD  Cholecalciferol (VITAMIN D3) 2000 UNITS TABS Take by mouth daily.   Yes Historical Provider, MD  estradiol (ESTRACE) 0.5 MG tablet Take 0.5 mg by mouth daily.   Yes Historical Provider, MD  metoprolol succinate (TOPROL-XL) 25 MG 24 hr tablet Take 12.5 mg by mouth 2 (two) times daily.   Yes Historical Provider, MD  norethindrone (AYGESTIN) 5 MG tablet Take 5 mg by mouth. Every 4 months x 15 days   Yes Historical Provider, MD  omega-3 acid ethyl esters (  LOVAZA) 1 G capsule Take 1 g by mouth. 2 by mouth two times a day   Yes Historical Provider, MD   Physical Exam: Filed Vitals:   02/29/12 1300 02/29/12 1318 02/29/12 1400 02/29/12 1500  BP: 125/62 125/62 122/54 117/53  Pulse: 61 72 71 77  Temp:      TempSrc:      Resp: 17 19 19    Height:      Weight:      SpO2: 97% 99% 98% 98%    Constitutional: Vital signs reviewed.  Patient is a well-developed and well-nourished in no acute distress and cooperative with exam. Alert and oriented x3.  Head: Normocephalic and  atraumatic Mouth: no erythema or exudates, MMM Eyes: PERRL, EOMI, conjunctivae normal, No scleral icterus.  Neck: Supple, Trachea midline normal ROM, No JVD, mass, thyromegaly, or carotid bruit present.  Cardiovascular: RRR, S1 normal, S2 normal, 3/6 systolic murmur, pulses symmetric and intact bilaterally Pulmonary/Chest: CTAB, no wheezes, rales, or rhonchi Abdominal: Soft. Non-tender, non-distended, bowel sounds are normal, no masses, organomegaly, or guarding present. Extremities: No cyanosis and no edema  Neurological: A&O x3, Strength is normal and symmetric bilaterally, cranial nerve II-XII are grossly intact, no focal motor deficit, sensory intact to light touch bilaterally.  Skin: Warm, dry and intact. No rash, cyanosis.  Psychiatric: Normal mood and affect. speech and behavior is normal. Judgment and thought content normal. Cognition and memory are normal.    Labs on Admission:  Basic Metabolic Panel:  Lab 02/29/12 1610  NA 138  K 3.4*  CL 101  CO2 27  GLUCOSE 100*  BUN 16  CREATININE 0.69  CALCIUM 9.5  MG --  PHOS --   Liver Function Tests: No results found for this basename: AST:5,ALT:5,ALKPHOS:5,BILITOT:5,PROT:5,ALBUMIN:5 in the last 168 hours No results found for this basename: LIPASE:5,AMYLASE:5 in the last 168 hours No results found for this basename: AMMONIA:5 in the last 168 hours CBC:  Lab 02/29/12 0954  WBC 7.8  NEUTROABS --  HGB 14.9  HCT 44.4  MCV 91.7  PLT 186   Cardiac Enzymes: No results found for this basename: CKTOTAL:5,CKMB:5,CKMBINDEX:5,TROPONINI:5 in the last 168 hours  BNP (last 3 results) No results found for this basename: PROBNP:3 in the last 8760 hours CBG: No results found for this basename: GLUCAP:5 in the last 168 hours  Radiological Exams on Admission: Dg Chest 2 View  02/29/2012  *RADIOLOGY REPORT*  Clinical Data: Fall, L O C  CHEST - 2 VIEW  Comparison: None.  Findings: Cardiomediastinal silhouette is unremarkable.  Status  post median sternotomy.  No acute infiltrate or pleural effusion. No pulmonary edema.  Mild degenerative changes thoracic spine.  IMPRESSION: No active disease.  Status post median sternotomy.   Original Report Authenticated By: Natasha Mead, M.D.    Ct Head Wo Contrast  02/29/2012  *RADIOLOGY REPORT*  Clinical Data: Dizziness followed by trauma  CT HEAD WITHOUT CONTRAST  Technique:  Contiguous axial images were obtained from the base of the skull through the vertex without contrast. Study was performed within 24 hours of patient arrival at the emergency department.  Comparison: None.  Findings:  Ventricles are normal in size and configuration.  There is no mass, hemorrhage, extra-axial fluid collection, or midline shift. No evidence of acute infarct.  The gray-white compartments are normal.  Bony calvarium appears intact.  The mastoid air cells are clear. There is mucosal thickening in the sphenoid sinus region.  Impression:  Mild sphenoid sinus disease.  Study otherwise unremarkable.  Original Report Authenticated By: Bretta Bang, M.D.       Assessment/Plan Principal Problem:  *Syncope -suspicious for cardiac etiology -As above, CT scan of head negative -Monitor on telemetry, cycle cardiac enzymes obtain 2-D echocardiogram as well as carotid Dopplers. -Follow consult cardiology in a.m. pending studies-she states that she's been seeing cardiology at [facility] since her aVR but had seen Dr. Allyson Sabal prior to the aVR. Active Problems:  AORTIC VALVE REPLACEMENT, HX OF-secondary to stenotic bicuspid aortic valve(per pt report). - as above, echocardiogram  And follow. She has a tissue valve and so is on no coumadin.  Hypokalemia Replace k h/o hyperlipidemia -continue outpatient medications.    Code Status: Full code Family Communication: Daughter and friend at bedside Disposition Plan: Admit to telemetry, observation  Time spent:>66mins  Kela Millin [facility] Pager [phone]  If  7PM-7AM, please contact night-coverage [email or web address] Password [email or web address] 02/29/2012, 3:50 PM

## 2012-02-29 NOTE — ED Notes (Signed)
Pt alert and oriented on arrival to CDU 10. Multiple family members at bedside.  Pt amb to BR with assist, denies dizziness or pain.

## 2012-02-29 NOTE — ED Provider Notes (Signed)
History     CSN: [id number]  Arrival date & time 02/29/12  7829   First MD Initiated Contact with Patient 02/29/12 [phone]      Chief Complaint  Patient presents with  . Loss of Consciousness  . Fall    (Consider location/radiation/quality/duration/timing/severity/associated sxs/prior treatment) HPI Pt presents with c/o syncopal event.  She states she was seated at a table and began to feel lightheaded, then fainted, falling to the floor and hitting the left side of her head.  She denies feeling chest pain, palpiations or severe headache prior to the fall.  She states she feels back to her baseline now.  No witnessed seizure activity.  She has been in her usual state of health, drinking liquids normally, no recent medication changes.  She did have a similar event approx 1 week ago- has not had any diagnostic workup thus far.  She has hx of aortic valve replacement.   Past Medical History  Diagnosis Date  . Seasonal allergies   . Hyperlipemia   . S/P AVR (aortic valve replacement)   . Heart murmur   . Migraines     "occasionally" (02/29/2012)  . Arthritis     "right knee" (02/29/2012)  . Syncope and collapse 02/29/2012    "lost consciousness; hit left eye; little crack bottom front tooth" (02/29/2012)    Past Surgical History  Procedure Date  . G 2 p 2   . Colonoscopy 2005    negative  . Tonsillectomy and adenoidectomy 1962  . Cardiac valve replacement 2010    "aortic valve" (02/29/2012)    Family History  Problem Relation Age of Onset  . Heart disease Mother     MI @ 82; CBAG  . Heart disease Father     MI @ 79  . Colon polyps Father   . Heart disease      in both M & P uncles & aunts; no premature MI    History  Substance Use Topics  . Smoking status: Former Smoker -- 0.5 packs/day for 42 years    Types: Cigarettes  . Smokeless tobacco: Never Used     Comment: 02/29/2012 "quit smoking ~ 5 yr ago"  . Alcohol Use: No    OB History    Grav Para Term Preterm  Abortions TAB SAB Ect Mult Living                  Review of Systems ROS reviewed and all otherwise negative except for mentioned in HPI  Allergies  Review of patient's allergies indicates no known allergies.  Home Medications   No current outpatient prescriptions on file.  BP 127/63  Pulse 62  Temp 98.2 F (36.8 C) (Oral)  Resp 15  Ht 5\' 3"  (1.6 m)  Wt 137 lb 6.4 oz (62.324 kg)  BMI 24.34 kg/m2  SpO2 96% Vitals reviewed Physical Exam Physical Examination: General appearance - alert, well appearing, and in no distress Mental status - alert, oriented to person, place, and time Eyes - pupils equal and reactive, extraocular eye movements intact Neck- no midline cspine tenderness, FROM of neck- ccollar cleared clinically by me Mouth - mucous membranes moist, pharynx normal without lesions Chest - clear to auscultation, no wheezes, rales or rhonchi, symmetric air entry Heart - normal rate, regular rhythm, normal S1, S2, no murmurs, rubs, clicks or gallops Abdomen - soft, nontender, nondistended, no masses or organomegaly Extremities - peripheral pulses normal, no pedal edema, no clubbing or cyanosis Skin - normal coloration  and turgor, no rashes, approx 2-3cm linear vertical laceration on left face lateral to orbit  ED Course  Procedures (including critical care time)   11:39 AM pt moved to CDU while awaiting labs, xrays, will need facial laceration repaired.  Will also need overnight admission to tele/syncope workup.  C/w CDU PA who will f/u on tests and arrange for admission.  Pt states she is agreeable with this plan.    Date: 02/29/2012  Rate: 67  Rhythm: normal sinus rhythm   QRS Axis: normal  Intervals: PR prolonged  ST/T Wave abnormalities: normal  Conduction Disutrbances:right bundle branch block  Narrative Interpretation:   Old EKG Reviewed: none available   Labs Reviewed  BASIC METABOLIC PANEL - Abnormal; Notable for the following:    Potassium 3.4 (*)      Glucose, Bld 100 (*)     GFR calc non Af Amer 88 (*)     All other components within normal limits  URINALYSIS, ROUTINE W REFLEX MICROSCOPIC - Abnormal; Notable for the following:    Leukocytes, UA LARGE (*)     All other components within normal limits  URINE MICROSCOPIC-ADD ON - Abnormal; Notable for the following:    Squamous Epithelial / LPF FEW (*)     Bacteria, UA FEW (*)     All other components within normal limits  CREATININE, SERUM - Abnormal; Notable for the following:    GFR calc non Af Amer 87 (*)     All other components within normal limits  CBC  URINE CULTURE  CBC  TSH  TROPONIN I  TROPONIN I  TROPONIN I  BASIC METABOLIC PANEL   Dg Chest 2 View  02/29/2012  *RADIOLOGY REPORT*  Clinical Data: Fall, L O C  CHEST - 2 VIEW  Comparison: None.  Findings: Cardiomediastinal silhouette is unremarkable.  Status post median sternotomy.  No acute infiltrate or pleural effusion. No pulmonary edema.  Mild degenerative changes thoracic spine.  IMPRESSION: No active disease.  Status post median sternotomy.   Original Report Authenticated By: Natasha Mead, M.D.    Ct Head Wo Contrast  02/29/2012  *RADIOLOGY REPORT*  Clinical Data: Dizziness followed by trauma  CT HEAD WITHOUT CONTRAST  Technique:  Contiguous axial images were obtained from the base of the skull through the vertex without contrast. Study was performed within 24 hours of patient arrival at the emergency department.  Comparison: None.  Findings:  Ventricles are normal in size and configuration.  There is no mass, hemorrhage, extra-axial fluid collection, or midline shift. No evidence of acute infarct.  The gray-white compartments are normal.  Bony calvarium appears intact.  The mastoid air cells are clear. There is mucosal thickening in the sphenoid sinus region.  Impression:  Mild sphenoid sinus disease.  Study otherwise unremarkable.   Original Report Authenticated By: Bretta Bang, M.D.      1. S/P AVR (aortic valve  replacement)   2. Syncope   3. Heart valve replaced by other means   4. Hypokalemia   5. Other and unspecified hyperlipidemia       MDM  Pt presenting with c/o syncope- she has hx of AVR- did have prodrome prior to syncope suggesting vagal event however.  Must should be evaluated further due to her hx.  Laceration to left face. Head CT, CXR ordered.  PT not anemic.  Pt moved to CDU to complete her workup and arrange for admission.          Ethelda Chick, MD  03/01/12 1038 

## 2012-03-01 ENCOUNTER — Encounter (HOSPITAL_COMMUNITY)
Admission: EM | Disposition: A | Discharge status: Home / Self Care | Payer: Self-pay | Source: Home / Self Care | Attending: Internal Medicine

## 2012-03-01 ENCOUNTER — Encounter (HOSPITAL_COMMUNITY): Payer: Self-pay | Admitting: Physician Assistant

## 2012-03-01 DIAGNOSIS — I455 Other specified heart block: Secondary | ICD-10-CM

## 2012-03-01 DIAGNOSIS — I442 Atrioventricular block, complete: Secondary | ICD-10-CM

## 2012-03-01 DIAGNOSIS — I359 Nonrheumatic aortic valve disorder, unspecified: Secondary | ICD-10-CM

## 2012-03-01 HISTORY — PX: TEMPORARY PACEMAKER INSERTION: SHX5471

## 2012-03-01 LAB — BASIC METABOLIC PANEL
BUN: 11 mg/dL (ref 6–23)
Calcium: 8.6 mg/dL (ref 8.4–10.5)
GFR calc Af Amer: >90 mL/min (ref >90)
GFR calc non Af Amer: >90 mL/min (ref >90)
Glucose, Bld: 89 mg/dL (ref 70–99)
Potassium: 4.1 mEq/L (ref 3.5–5.1)
Sodium: 140 mEq/L (ref 135–145)

## 2012-03-01 LAB — TROPONIN I: Troponin I: <0.3 ng/mL (ref <0.30)

## 2012-03-01 LAB — TSH: TSH: 1.321 u[IU]/mL (ref 0.350–4.500)

## 2012-03-01 LAB — URINE CULTURE
Colony Count: NO GROWTH
Culture: NO GROWTH

## 2012-03-01 SURGERY — TEMPORARY PACEMAKER INSERTION
Anesthesia: LOCAL

## 2012-03-01 MED ORDER — SODIUM CHLORIDE 0.9 % IJ SOLN
3.0000 mL | Freq: Two times a day (BID) | INTRAMUSCULAR | Status: DC
  Administered 2012-03-01 – 2012-03-02 (×2): 3 mL via INTRAVENOUS

## 2012-03-01 MED ORDER — SODIUM CHLORIDE 0.9 % IV SOLN: INTRAVENOUS | Status: DC

## 2012-03-01 MED ORDER — MIDAZOLAM HCL 2 MG/2ML IJ SOLN
INTRAMUSCULAR | Status: AC
  Filled 2012-03-01: qty 2

## 2012-03-01 MED ORDER — SODIUM CHLORIDE 0.9 % IV SOLN: 250.0000 mL | INTRAVENOUS | Status: DC

## 2012-03-01 MED ORDER — SODIUM CHLORIDE 0.9 % IR SOLN
80.0000 mg | Status: DC
  Filled 2012-03-01: qty 2

## 2012-03-01 MED ORDER — HEPARIN (PORCINE) IN NACL 2-0.9 UNIT/ML-% IJ SOLN
INTRAMUSCULAR | Status: AC
  Filled 2012-03-01: qty 500

## 2012-03-01 MED ORDER — CEFAZOLIN SODIUM-DEXTROSE 2-3 GM-% IV SOLR
2.0000 g | INTRAVENOUS | Status: DC
  Filled 2012-03-01: qty 50

## 2012-03-01 MED ORDER — ALPRAZOLAM 0.25 MG PO TABS
0.2500 mg | ORAL_TABLET | Freq: Two times a day (BID) | ORAL | Status: DC | PRN
  Administered 2012-03-01: 0.25 mg via ORAL
  Filled 2012-03-01: qty 1

## 2012-03-01 MED ORDER — CHLORHEXIDINE GLUCONATE 4 % EX LIQD: 60.0000 mL | Freq: Once | CUTANEOUS | Status: DC

## 2012-03-01 MED ORDER — LIDOCAINE HCL (PF) 1 % IJ SOLN
INTRAMUSCULAR | Status: AC
  Filled 2012-03-01: qty 30

## 2012-03-01 MED ORDER — FENTANYL CITRATE 0.05 MG/ML IJ SOLN
INTRAMUSCULAR | Status: AC
  Filled 2012-03-01: qty 2

## 2012-03-01 MED ORDER — SODIUM CHLORIDE 0.9 % IJ SOLN: 3.0000 mL | INTRAMUSCULAR | Status: DC | PRN

## 2012-03-01 MED ORDER — CHLORHEXIDINE GLUCONATE 4 % EX LIQD
60.0000 mL | Freq: Once | CUTANEOUS | Status: AC
  Administered 2012-03-02: 4 via TOPICAL
  Filled 2012-03-01: qty 15

## 2012-03-01 MED ORDER — SODIUM CHLORIDE 0.45 % IV SOLN
INTRAVENOUS | Status: DC
  Administered 2012-03-02: 05:00:00 via INTRAVENOUS

## 2012-03-01 MED ORDER — CHLORHEXIDINE GLUCONATE 4 % EX LIQD
60.0000 mL | Freq: Once | CUTANEOUS | Status: AC
  Administered 2012-03-01: 4 via TOPICAL
  Filled 2012-03-01: qty 15

## 2012-03-01 MED ORDER — SODIUM CHLORIDE 0.45 % IV SOLN: INTRAVENOUS | Status: DC

## 2012-03-01 NOTE — Consult Note (Signed)
CARDIOLOGY CONSULT NOTE  Patient ID: Angel Waller, MRN: [id number], DOB/AGE: [birth year] 66 y.o. Admit date: 02/29/2012   Date of Consult: 03/01/2012 Primary Physician: Marga Melnick, MD Primary Cardiologist: Dr. Willette Cluster at [facility]. New to [facility] - previously saw Dr. Allyson Sabal but verbalized desire to change to [facility].  Chief Complaint: passed out Reason for Consult: symptomatic bradycardia  HPI: Angel Waller is a 66 y/o F with history of tissue AVR 2003, HL who presented to [facility] with two episodes of syncope. Last week while at a football game she suddenly began to feel dizzy and passed out. EMS was called. She was told her vitals were fine, but declined to go to the hospital. She called her cardiologist and was planning to schedule a f/u appointment. 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 She promptly passed out for 1-2 seconds and fell onto the concrete floor. She sustained a laceration and ecchymosis over her L eyelid. She denies any CP, SOB, nausea, diaphoresis prior to or after the events. Before last week, she had been doing well. She has not had any exertional symptoms. She believes she had palpitations prior to the first episode of syncope but not with subsequent episodes. As of this morning she has had multiple episodes of 5-7 second pauses where P wave activity is present but there is ventricular standstill. This has been associated with the same dizziness she has been experiencing. She was on Toprol XL 12.5mg  daily which has been held as of this morning.  Past Medical History  Diagnosis Date  . Seasonal allergies   . Hyperlipemia   . S/P AVR (aortic valve replacement)     July 2010  . Migraines     "occasionally" (02/29/2012)  . Arthritis     "right knee" (02/29/2012)      Most Recent Cardiac Studies: Patient reported: - echocardiogram 06/2011 with "mild leak around the heart valve" - after she called  her [facility] cardiologist with reported syncope, there was mention of possible scheduling of TEE to evaluate - she reports normal coronaries by cardiac catheterization at time of AVR 2010  [facility] Echo 01/2009 Normal LV function. Normal RV function. Mild MR, PR, trivial Tr. bioprosthetic AVR.   Surgical History:  Past Surgical History  Procedure Date  . Colonoscopy 2005    negative  . Tonsillectomy and adenoidectomy 1962  . Cardiac valve replacement 2010    tissue aortic valve     Home Meds: Prior to Admission medications   Medication Sig Start Date End Date Taking? Authorizing Provider  amoxicillin (AMOXIL) 500 MG capsule Take 500 mg by mouth. 4 pills 60 min prior to dental procedure   Yes Historical Provider, MD  aspirin 81 MG tablet Take 81 mg by mouth daily.   Yes Historical Provider, MD  atorvastatin (LIPITOR) 40 MG tablet Take 40 mg by mouth daily.   Yes Historical Provider, MD  Cholecalciferol (VITAMIN D3) 2000 UNITS TABS Take by mouth daily.   Yes Historical Provider, MD  estradiol (ESTRACE) 0.5 MG tablet Take 0.5 mg by mouth daily.   Yes Historical Provider, MD  metoprolol succinate (TOPROL-XL) 25 MG 24 hr tablet Take 12.5 mg by mouth 2 (two) times daily.   Yes Historical Provider, MD  norethindrone (AYGESTIN) 5 MG tablet Take 5 mg by mouth. Every 4 months x 15 days   Yes Historical Provider, MD  omega-3 acid ethyl esters (LOVAZA) 1  G capsule Take 1 g by mouth. 2 by mouth two times a day   Yes Historical Provider, MD    Inpatient Medications:     . aspirin EC  81 mg Oral Daily  . atorvastatin  40 mg Oral q1800  . enoxaparin (LOVENOX) injection  40 mg Subcutaneous Q24H  . estradiol  0.5 mg Oral Daily  . pneumococcal 23 valent vaccine  0.5 mL Intramuscular Tomorrow-1000  . [COMPLETED] potassium chloride  40 mEq Oral Once  . [COMPLETED] sodium chloride  1,000 mL Intravenous Once  . [COMPLETED] TDaP  0.5 mL Intramuscular Once  . [DISCONTINUED] sodium chloride   Intravenous STAT    . [DISCONTINUED] aspirin  81 mg Oral Daily  . [DISCONTINUED] metoprolol succinate  12.5 mg Oral BID    Allergies: No Known Allergies  History   Social History  . Marital Status: Married    Spouse Name: N/A    Number of Children: N/A  . Years of Education: N/A   Occupational History  . Special Education Teacher    Social History Main Topics  . Smoking status: Former Smoker -- 0.5 packs/day for 42 years    Types: Cigarettes  . Smokeless tobacco: Never Used     Comment: 02/29/2012 "quit smoking ~ 5 yr ago"  . Alcohol Use: No  . Drug Use: No  . Sexually Active: Yes   Other Topics Concern  . Not on file   Social History Narrative  . No narrative on file     Family History  Problem Relation Age of Onset  . Heart disease Mother     MI @ 70; CABG  . Heart disease Father     MI @ 76  . Colon polyps Father   . Heart disease      in both M & P uncles & aunts; no premature MI     Review of Systems: General: negative for chills, fever, night sweats or weight changes.  Cardiovascular: negative for chest pain, edema, orthopnea, paroxysmal nocturnal dyspnea, shortness of breath or dyspnea on exertion Dermatological: negative for rash Respiratory: negative for cough or wheezing Urologic: negative for hematuria Abdominal: negative for nausea, vomiting, diarrhea, bright red blood per rectum, melena, or hematemesis Neurologic: see above. No incontinence or jerking activity. All other systems reviewed and are otherwise negative except as noted above.  Labs:  [facility] 03/01/12 2956 02/29/12 2323 02/29/12 1731  CKTOTAL -- -- --  CKMB -- -- --  TROPONINI <0.30 <0.30 <0.30   Lab Results  Component Value Date   WBC 7.2 02/29/2012   HGB 13.7 02/29/2012   HCT 40.6 02/29/2012   MCV 90.6 02/29/2012   PLT 180 02/29/2012     Lab 03/01/12 0619  NA 140  K 4.1  CL 110  CO2 24  BUN 11  CREATININE 0.63  CALCIUM 8.6  PROT --  BILITOT --  ALKPHOS --  ALT --  AST --  GLUCOSE 89    Lab Results  Component Value Date   CHOL 157 02/16/2010   HDL 46.00 02/16/2010   LDLCALC 101* 02/16/2010   TRIG 50.0 02/16/2010    Radiology/Studies:  Dg Chest 2 View 02/29/2012  *RADIOLOGY REPORT*  Clinical Data: Fall, L O C  CHEST - 2 VIEW  Comparison: None.  Findings: Cardiomediastinal silhouette is unremarkable.  Status post median sternotomy.  No acute infiltrate or pleural effusion. No pulmonary edema.  Mild degenerative changes thoracic spine.  IMPRESSION: No active disease.  Status post median  sternotomy.   Original Report Authenticated By: Natasha Mead, M.D.    Ct Head Wo Contrast 02/29/2012  *RADIOLOGY REPORT*  Clinical Data: Dizziness followed by trauma  CT HEAD WITHOUT CONTRAST  Technique:  Contiguous axial images were obtained from the base of the skull through the vertex without contrast. Study was performed within 24 hours of patient arrival at the emergency department.  Comparison: None.  Findings:  Ventricles are normal in size and configuration.  There is no mass, hemorrhage, extra-axial fluid collection, or midline shift. No evidence of acute infarct.  The gray-white compartments are normal.  Bony calvarium appears intact.  The mastoid air cells are clear. There is mucosal thickening in the sphenoid sinus region.  Impression:  Mild sphenoid sinus disease.  Study otherwise unremarkable.   Original Report Authenticated By: Bretta Bang, M.D.    EKG: NSR 67bpm 1st degree AVB, RBBB, no prior tracing to compare to  Physical Exam: Blood pressure 127/63, pulse 62, temperature 98.2 F (36.8 C), temperature source Oral, resp. rate 15, height 5\' 3"  (1.6 m), weight 137 lb 6.4 oz (62.324 kg), SpO2 96.00%. General: Well developed, well nourished, in no acute distress. Head: Normocephalic, sclera non-icteric, no xanthomas, nares are without discharge. Ecchymosis with mild periorbital edema over L eye. Neck: Negative for carotid bruits. JVD not elevated. Lungs: Clear bilaterally to  auscultation without wheezes, rales, or rhonchi. Breathing is unlabored. Heart: RRR with S1 S2. Loud 3/6 SEM heard best at RUSB but also appreciated down to apex. No rubs or gallops appreciated. Abdomen: Soft, non-tender, non-distended with normoactive bowel sounds. No hepatomegaly. No rebound/guarding. No obvious abdominal masses. Msk:  Strength and tone appear normal for age. Extremities: No clubbing or cyanosis. No edema.  Distal pedal pulses are 2+ and equal bilaterally. Neuro: Alert and oriented X 3. No facial asymmetry. No focal deficit. Moves all extremities spontaneously. Psych:  Responds to questions appropriately with a normal affect.   Assessment and Plan:   1. Syncope likely related to symptomatic bradycardia - high grade heart block on telemetry with multiple 5-7sec pauses (P wave activity without subsequent QRS complex). Was only on very low dose BB prior to admission which has been discontinued. TSH WNL. D/w Dr. Clifton James. Transfer to stepdown. Plan temp pacemaker wire today (EP schedule full) with EP eval tonight/tomorrow for probable subsequent pacemaker. 2. Tissue aortic valve replacement 2010 with significant SEM - 2D echo pending. 3. Hypokalemia, repleted - doubt contributing to bradycardia as she has continued to have pauses despite normal K this AM. Follow. 4. Hyperlipidemia - continue statin.  Signed, Ronie Spies PA-C 03/01/2012, 11:49 AM  I have personally seen and examined this patient with Ronie Spies, PA-C. I agree with the assessment and plan as outlined above. She has high grade AV block. Normal TSH. Recent normal LV function on echo at [facility] in April 2013. Repeat echo pending today. Given her dizziness and syncope, she will need a temporary pacemaker today. I have discussed this with the EP team. Will likely need permanent pacemaker this week.   [place] 12:37 PM 03/01/2012

## 2012-03-01 NOTE — Consult Note (Signed)
ELECTROPHYSIOLOGY CONSULT NOTE  Patient ID: Angel Waller MRN: [id number], DOB/AGE: 67/19/1947   Admit date: 02/29/2012 Date of Consult: 03/01/2012  Primary Physician: Marga Melnick, MD Primary Cardiologist: Willette Cluster, MD at [facility] Reason for Consultation: Complete heart block  History of Present Illness Angel Waller is a pleasant 66 year old woman with history of aortic valve disease s/p tissue AVR in 2003 and dyslipidemia who has been admitted with syncope. She reports feeling like her usual self until last week when she experienced her first episode of syncope while watching a football game. She describes feeling dizzy just prior to an abrupt, brief LOC. EMS was called; however, she declined transportation to the hospital because she was feeling better. She was planning to follow-up with her cardiologist at [facility]. 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 Again she felt dizzy just prior to an abrupt, brief LOC. She collapsed and sustained facial lacerations. She denies CP, SOB, palpitations, nausea or diaphoresis surrounding these episodes. She denies loss of bowel or bladder control. On presentation here, she was found to have multiple 6-7 second pauses with complete heart block and ventricular standstill on telemetry. She is now s/p placement of a temporary transvenous pacing wire. Of note, she was taking low dose metoprolol which has now been held.  Past Medical History Past Medical History  Diagnosis Date  . Seasonal allergies   . Hyperlipemia   . S/P AVR (aortic valve replacement)     July 2010  . Migraines     "occasionally" (02/29/2012)  . Arthritis     "right knee" (02/29/2012)    Past Surgical History Past Surgical History  Procedure Date  . Colonoscopy 2005    negative  . Tonsillectomy and adenoidectomy 1962  . Cardiac valve replacement 2010    tissue aortic valve     Allergies/Intolerances No Known  Allergies  Inpatient Medications    . aspirin EC  81 mg Oral Daily  . atorvastatin  40 mg Oral q1800  . estradiol  0.5 mg Oral Daily  . [COMPLETED] fentaNYL      . [COMPLETED] heparin      . [COMPLETED] lidocaine      . [COMPLETED] midazolam      . pneumococcal 23 valent vaccine  0.5 mL Intramuscular Tomorrow-1000  . [COMPLETED] potassium chloride  40 mEq Oral Once  . [COMPLETED] sodium chloride  1,000 mL Intravenous Once  . [DISCONTINUED] sodium chloride   Intravenous STAT  . [DISCONTINUED] aspirin  81 mg Oral Daily  . [DISCONTINUED] enoxaparin (LOVENOX) injection  40 mg Subcutaneous Q24H  . [DISCONTINUED] metoprolol succinate  12.5 mg Oral BID      . [DISCONTINUED] sodium chloride 100 mL/hr at 03/01/12 0228    Family History Family History  Problem Relation Age of Onset  . Heart disease Mother     MI @ 58; CABG  . Heart disease Father     MI @ 57  . Colon polyps Father   . Heart disease      in both M & P uncles & aunts; no premature MI     Social History Social History  . Marital Status: Married   Occupational History  . Special Education Teacher    Social History Main Topics  . Smoking status: Former Smoker -- 0.5 packs/day for 42 years    Types: Cigarettes  . Smokeless tobacco: Never Used     Comment: 02/29/2012 "quit smoking ~ 5 yr ago"  .  Alcohol Use: No  . Drug Use: No   Review of Systems General: No chills, fever, night sweats or weight changes  Cardiovascular: +syncope  No chest pain, dyspnea on exertion, edema, orthopnea, palpitations, PND Dermatological: No rash, lesions or masses Respiratory: No cough, dyspnea Urologic: No hematuria, dysuria Abdominal: No nausea, vomiting, diarrhea, bright red blood per rectum, melena, or hematemesis Neurologic: +dizziness +syncope  No visual changes All other systems reviewed and are otherwise negative except as noted above.  Physical Exam Blood pressure 127/70, pulse 73, temperature 98.2 F (36.8 C),  temperature source Oral, resp. rate 18, height 5\' 3"  (1.6 m), weight 138 lb (62.596 kg), SpO2 96.00%.  General: Well developed, well appearing 66 year old female in no acute distress. HEENT: Normocephalic, atraumatic. EOMs intact. Sclera nonicteric. Oropharynx clear.  Neck: Supple without bruits. No JVD. Lungs: Respirations regular and unlabored, CTA bilaterally. No wheezes, rales or rhonchi. Heart: RRR. S1, S2 present. No murmurs, rub, S3 or S4. Abdomen: Soft, non-tender, non-distended. BS present x 4 quadrants. No hepatosplenomegaly.  Extremities: No clubbing, cyanosis or edema. DP/PT/Radials 2+ and equal bilaterally. Psych: Normal affect. Neuro: Alert and oriented X 3. Moves all extremities spontaneously. Musculoskeletal: No kyphosis. Skin: Intact. Warm and dry. No rashes or petechiae in exposed areas.   Labs  [facility] 03/01/12 1478 02/29/12 2323 02/29/12 1731  CKTOTAL -- -- --  CKMB -- -- --  TROPONINI <0.30 <0.30 <0.30   Lab Results  Component Value Date   WBC 7.2 02/29/2012   HGB 13.7 02/29/2012   HCT 40.6 02/29/2012   MCV 90.6 02/29/2012   PLT 180 02/29/2012    Lab 03/01/12 0619  NA 140  K 4.1  CL 110  CO2 24  BUN 11  CREATININE 0.63  CALCIUM 8.6  PROT --  BILITOT --  ALKPHOS --  ALT --  AST --  GLUCOSE 89   No components found with this basename: MAGNESIUM No components found with this basename: POCBNP:3 No results found for this basename: DDIMER    Basename 02/29/12 1742  TSH 1.321  T4TOTAL --  T3FREE --  THYROIDAB --    Radiology/Studies Dg Chest 2 View  02/29/2012  *RADIOLOGY REPORT*  Clinical Data: Fall, L O C  CHEST - 2 VIEW  Comparison: None.  Findings: Cardiomediastinal silhouette is unremarkable.  Status post median sternotomy.  No acute infiltrate or pleural effusion. No pulmonary edema.  Mild degenerative changes thoracic spine.  IMPRESSION: No active disease.  Status post median sternotomy.   Original Report Authenticated By: Natasha Mead, M.D.     Ct Head Wo Contrast  02/29/2012  *RADIOLOGY REPORT*  Clinical Data: Dizziness followed by trauma  CT HEAD WITHOUT CONTRAST  Technique:  Contiguous axial images were obtained from the base of the skull through the vertex without contrast. Study was performed within 24 hours of patient arrival at the emergency department.  Comparison: None.  Findings:  Ventricles are normal in size and configuration.  There is no mass, hemorrhage, extra-axial fluid collection, or midline shift. No evidence of acute infarct.  The gray-white compartments are normal.  Bony calvarium appears intact.  The mastoid air cells are clear. There is mucosal thickening in the sphenoid sinus region.  Impression:  Mild sphenoid sinus disease.  Study otherwise unremarkable.   Original Report Authenticated By: Bretta Bang, M.D.     Echocardiogram  Pending  12-lead ECG on admission shows sinus rhythm with 1st degree AV block and RBBB at 67 bpm Telemetry currently shows sinus  rhythm with 1st degree AV block at 64 bpm; strips from ED reviewed which show intermittent complete heart block with ventricular standstill, pauses ~6-7 seconds  Assessment and Plan 1. Complete heart block with ventricular standstill, pauses ~6-7 seconds 2. Prior AVR Ms. Juma has documented symptomatic complete heart block. She has underlying conduction system disease. PPM implantation is recommended. Risks, benefits and alternatives to PPM implantation were discussed in detail with Ms. Deprey today. These risks include, but are not limited to, bleeding, infection, pneumothorax, perforation, tamponade, vascular damage, renal failure, lead dislodgement, MI, stroke and death. Ms. Torrance expressed verbal understanding and agrees to proceed. Dr. Ladona Ridgel to see.  Signed, Rick Duff, PA-C 03/01/2012, 2:08 PM  EP Attending  Patient seen and examined. Agree with the note above by Rick Duff, PA-C. She has recurrent Stokes-Adams attacks in the  setting of prior AVR and NSR with first degree AV block and RBBB. She has been on very low dose beta blocker but I do not think that stopping her beta blocker will result in prevention of her syncope and long pauses. Will tentatively plan PPM tomorrow.  Leonia Reeves.D.

## 2012-03-01 NOTE — Care Management Note (Signed)
    Page 1 of 1   03/01/2012     1:46:59 PM   CARE MANAGEMENT NOTE 03/01/2012  Patient:  Angel Waller, Angel Waller   Account Number:  [id number]  Date Initiated:  03/01/2012  Documentation initiated by:  Junius Creamer  Subjective/Objective Assessment:   adm w syncope     Action/Plan:   lives w husband, pcp dr Chrissie Noa hopper   Anticipated DC Date:     Anticipated DC Plan:        DC Planning Services  CM consult      Choice offered to / List presented to:             Status of service:   Medicare Important Message given?   (If response is "NO", the following Medicare IM given date fields will be blank) Date Medicare IM given:   Date Additional Medicare IM given:    Discharge Disposition:  HOME/SELF CARE  Per UR Regulation:  Reviewed for med. necessity/level of care/duration of stay  If discussed at Long Length of Stay Meetings, dates discussed:    Comments:  12/4 13:45p debbie Lillyonna Armstead rn,bsn [phone]

## 2012-03-01 NOTE — Interval H&P Note (Signed)
History and Physical Interval Note:  03/01/2012 12:40 PM  Angel Waller  has presented today for temporary pacemaker with  the diagnosis of syncope and Heart block. See full cardiology consult note from today. The various methods of treatment have been discussed with the patient and family. After consideration of risks, benefits and other options for treatment, the patient has consented to  Procedure(s) (LRB) with comments: TEMPORARY PACEMAKER INSERTION (N/A) as a surgical intervention .  The patient's history has been reviewed, patient examined, no change in status, stable for surgery.  I have reviewed the patient's chart and labs.  Questions were answered to the patient's satisfaction.     Lazer Wollard

## 2012-03-01 NOTE — Progress Notes (Signed)
Assumed care of patient. Pt c/o dizziness with lightheadness. Pt appears to going into second degree HB type 2 on monitor. Dr. Michel Santee paged to make aware. Vitals stable at this time. Will continue to monitor. Levonne Spiller, RN

## 2012-03-01 NOTE — Progress Notes (Addendum)
[facility]             Progress Note   Subjective: No complaints. Had about a 5 sec pause on tele with p waves but no QRS.  Objective: Vital signs in last 24 hours: Temp:  [90.6 F (32.6 C)-98.2 F (36.8 C)] 98.2 F (36.8 C) (12/04 0523) Pulse Rate:  [61-77] 62  (12/04 0523) Resp:  [15-20] 15  (12/04 0523) BP: (117-135)/(53-111) 127/63 mmHg (12/04 0523) SpO2:  [96 %-99 %] 96 % (12/04 0523) Weight:  [62.324 kg (137 lb 6.4 oz)] 62.324 kg (137 lb 6.4 oz) (12/03 1602) Weight change:  Last BM Date: 02/28/12  Intake/Output from previous day:   Total I/O In: 240 [P.O.:240] Out: -    Physical Exam: General: Alert, awake, oriented x3, in no acute distress. HEENT: No bruits, no goiter. Facial bruises around eye and laceration above left eyelid. Heart: Regular rate and rhythm, without murmurs, rubs, gallops. Lungs: Clear to auscultation bilaterally. Abdomen: Soft, nontender, nondistended, positive bowel sounds. Extremities: No clubbing cyanosis or edema with positive pedal pulses. Neuro: Grossly intact, nonfocal.    Lab Results: Basic Metabolic Panel:  Basename 03/01/12 0619 02/29/12 1742 02/29/12 0954  NA 140 -- 138  K 4.1 -- 3.4*  CL 110 -- 101  CO2 24 -- 27  GLUCOSE 89 -- 100*  BUN 11 -- 16  CREATININE 0.63 0.71 --  CALCIUM 8.6 -- 9.5  MG -- -- --  PHOS -- -- --   CBC:  Basename 02/29/12 1742 02/29/12 0954  WBC 7.2 7.8  NEUTROABS -- --  HGB 13.7 14.9  HCT 40.6 44.4  MCV 90.6 91.7  PLT 180 186   Cardiac Enzymes:  Basename 03/01/12 0619 02/29/12 2323 02/29/12 1731  CKTOTAL -- -- --  CKMB -- -- --  CKMBINDEX -- -- --  TROPONINI <0.30 <0.30 <0.30   Thyroid Function Tests:  Basename 02/29/12 1742  TSH 1.321  T4TOTAL --  FREET4 --  T3FREE --  THYROIDAB --   Urinalysis:  Basename 02/29/12 1045  COLORURINE YELLOW  LABSPEC 1.007  PHURINE 5.5  GLUCOSEU NEGATIVE  HGBUR NEGATIVE  BILIRUBINUR NEGATIVE  KETONESUR NEGATIVE  PROTEINUR  NEGATIVE  UROBILINOGEN 0.2  NITRITE NEGATIVE  LEUKOCYTESUR LARGE*    Recent Results (from the past 240 hour(s))  URINE CULTURE     Status: Normal   Collection Time   02/29/12 10:45 AM      Component Value Range Status Comment   Specimen Description URINE, CLEAN CATCH   Final    Special Requests NONE   Final    Culture  Setup Time 02/29/2012 11:37   Final    Colony Count NO GROWTH   Final    Culture NO GROWTH   Final    Report Status 03/01/2012 FINAL   Final     Studies/Results: Dg Chest 2 View  02/29/2012  *RADIOLOGY REPORT*  Clinical Data: Fall, L O C  CHEST - 2 VIEW  Comparison: None.  Findings: Cardiomediastinal silhouette is unremarkable.  Status post median sternotomy.  No acute infiltrate or pleural effusion. No pulmonary edema.  Mild degenerative changes thoracic spine.  IMPRESSION: No active disease.  Status post median sternotomy.   Original Report Authenticated By: Natasha Mead, M.D.    Ct Head Wo Contrast  02/29/2012  *RADIOLOGY REPORT*  Clinical Data: Dizziness followed by trauma  CT HEAD WITHOUT CONTRAST  Technique:  Contiguous axial images were obtained from the base of the skull through the vertex without contrast.  Study was performed within 24 hours of patient arrival at the emergency department.  Comparison: None.  Findings:  Ventricles are normal in size and configuration.  There is no mass, hemorrhage, extra-axial fluid collection, or midline shift. No evidence of acute infarct.  The gray-white compartments are normal.  Bony calvarium appears intact.  The mastoid air cells are clear. There is mucosal thickening in the sphenoid sinus region.  Impression:  Mild sphenoid sinus disease.  Study otherwise unremarkable.   Original Report Authenticated By: Bretta Bang, M.D.     Medications: Scheduled Meds:   . aspirin EC  81 mg Oral Daily  . atorvastatin  40 mg Oral q1800  . enoxaparin (LOVENOX) injection  40 mg Subcutaneous Q24H  . estradiol  0.5 mg Oral Daily  .  metoprolol succinate  12.5 mg Oral BID  . pneumococcal 23 valent vaccine  0.5 mL Intramuscular Tomorrow-1000  . [COMPLETED] potassium chloride  40 mEq Oral Once  . [COMPLETED] sodium chloride  1,000 mL Intravenous Once  . [COMPLETED] TDaP  0.5 mL Intramuscular Once  . [DISCONTINUED] sodium chloride   Intravenous STAT  . [DISCONTINUED] aspirin  81 mg Oral Daily   Continuous Infusions:   . sodium chloride 100 mL/hr at 03/01/12 0228   PRN Meds:.acetaminophen, acetaminophen, bisacodyl, ondansetron (ZOFRAN) IV, ondansetron, [DISCONTINUED] ondansetron (ZOFRAN) IV  Assessment/Plan:  Principal Problem:  *Syncope Active Problems:  AORTIC VALVE REPLACEMENT, HX OF  Hypokalemia  Sinus pause   Syncope -Has had 2 events in the past week, the recent one severe enough to cause facial bruising and lacerations. -Suspect cardiac etiology. -Had about a 5 sec pause on tele with p waves but no QRS, suspicious for conduction disease. -?need for pacer. -Will consult cardiology. She is requesting [facility] cards (specifically Dr. Gala Romney). -DC metoprolol. -Has ruled out for ACS.  Hypokalemia -Repleted.   Time spent coordinating care: 35 minutes.   LOS: 1 day   [facility] Pager: [phone] 03/01/2012, 10:45 AM

## 2012-03-01 NOTE — ED Provider Notes (Signed)
Medical screening examination/treatment/procedure(s) were conducted as a shared visit with non-physician practitioner(s) and myself.  I personally evaluated the patient during the encounter  I saw this patient primarily  Angel Chick, MD 03/01/12 1039

## 2012-03-01 NOTE — CV Procedure (Signed)
   Transvenous Pacemaker Placement  Angel Waller [id number] 12/4/201312:56 PM Marga Melnick, MD  Procedure Performed:  1. Placement of temporary transvenous pacemaker from the right femoral vein.   Operator: Verne Carrow, MD  Indication:  65 yo WF with history of bioprosthetic AVR admitted after syncopal episode and found to have high grade AV block multiple times today with associated dizziness.                                     Procedure Details: The risks, benefits, complications, treatment options, and expected outcomes were discussed with the patient. The patient and/or family concurred with the proposed plan, giving informed consent. The patient was brought to the cath lab.  The patient was further sedated with Versed and Fentanyl. The right groin was prepped and draped in the usual manner. 1% lidocaine was used for local anesthesia. Using the modified Seldinger access technique, a 6 French sheath was placed in the right femoral vein. I then advanced a balloon tipped temporary pacemaker wire into the right ventricle. The pacemaker was set at a rate of 55 bpm.   There were no immediate complications. The patient was taken to the recovery area in stable condition.   Hemodynamic Findings: Central aortic pressure: 126/68  Impression: 1. High grade AV block 2. Successful insertion temporary transvenous pacing wire from the right femoral vein  Recommendations: Will hold beta blocker. Will likely need insertion of permanent pacemaker tomorrow. Will ask EP to see today. Echo pending today.        Complications:  None; patient tolerated the procedure well.

## 2012-03-01 NOTE — Progress Notes (Signed)
*  PRELIMINARY RESULTS* Echocardiogram 2D Echocardiogram has been performed.  Angel Waller 03/01/2012, 5:14 PM

## 2012-03-02 ENCOUNTER — Encounter: Payer: Self-pay | Admitting: *Deleted

## 2012-03-02 ENCOUNTER — Encounter (HOSPITAL_COMMUNITY)
Admission: EM | Disposition: A | Discharge status: Home / Self Care | Payer: Self-pay | Source: Home / Self Care | Attending: Internal Medicine

## 2012-03-02 DIAGNOSIS — I441 Atrioventricular block, second degree: Secondary | ICD-10-CM

## 2012-03-02 DIAGNOSIS — Z950 Presence of cardiac pacemaker: Secondary | ICD-10-CM | POA: Insufficient documentation

## 2012-03-02 DIAGNOSIS — I4430 Unspecified atrioventricular block: Secondary | ICD-10-CM

## 2012-03-02 HISTORY — PX: PERMANENT PACEMAKER INSERTION: SHX5480

## 2012-03-02 HISTORY — PX: PERMANENT PACEMAKER INSERTION: SHX6023

## 2012-03-02 SURGERY — PERMANENT PACEMAKER INSERTION
Anesthesia: LOCAL

## 2012-03-02 MED ORDER — HEPARIN (PORCINE) IN NACL 2-0.9 UNIT/ML-% IJ SOLN
INTRAMUSCULAR | Status: AC
  Filled 2012-03-02: qty 500

## 2012-03-02 MED ORDER — MIDAZOLAM HCL 5 MG/5ML IJ SOLN
INTRAMUSCULAR | Status: AC
  Filled 2012-03-02: qty 5

## 2012-03-02 MED ORDER — SODIUM CHLORIDE 0.9 % IJ SOLN: 3.0000 mL | INTRAMUSCULAR | Status: DC | PRN

## 2012-03-02 MED ORDER — HYDROCODONE-ACETAMINOPHEN 5-325 MG PO TABS
1.0000 | ORAL_TABLET | ORAL | Status: DC | PRN
  Administered 2012-03-02 – 2012-03-03 (×2): 1 via ORAL
  Filled 2012-03-02: qty 2
  Filled 2012-03-02: qty 1

## 2012-03-02 MED ORDER — CEFAZOLIN SODIUM 1-5 GM-% IV SOLN
1.0000 g | Freq: Four times a day (QID) | INTRAVENOUS | Status: AC
  Administered 2012-03-02 – 2012-03-03 (×3): 1 g via INTRAVENOUS
  Filled 2012-03-02 (×3): qty 50

## 2012-03-02 MED ORDER — ONDANSETRON HCL 4 MG/2ML IJ SOLN: 4.0000 mg | Freq: Four times a day (QID) | INTRAMUSCULAR | Status: DC | PRN

## 2012-03-02 MED ORDER — FENTANYL CITRATE 0.05 MG/ML IJ SOLN
INTRAMUSCULAR | Status: AC
  Filled 2012-03-02: qty 2

## 2012-03-02 MED ORDER — SODIUM CHLORIDE 0.9 % IV SOLN: 250.0000 mL | INTRAVENOUS | Status: DC | PRN

## 2012-03-02 MED ORDER — ACETAMINOPHEN 325 MG PO TABS
325.0000 mg | ORAL_TABLET | ORAL | Status: DC | PRN
  Administered 2012-03-02 – 2012-03-03 (×2): 650 mg via ORAL
  Filled 2012-03-02 (×2): qty 2

## 2012-03-02 MED ORDER — LIDOCAINE HCL (PF) 1 % IJ SOLN
INTRAMUSCULAR | Status: AC
  Filled 2012-03-02: qty 60

## 2012-03-02 MED ORDER — CEFAZOLIN SODIUM 1-5 GM-% IV SOLN
INTRAVENOUS | Status: AC
  Filled 2012-03-02: qty 100

## 2012-03-02 MED ORDER — SODIUM CHLORIDE 0.9 % IJ SOLN
3.0000 mL | Freq: Two times a day (BID) | INTRAMUSCULAR | Status: DC
  Administered 2012-03-02 – 2012-03-03 (×2): 3 mL via INTRAVENOUS

## 2012-03-02 MED ORDER — PNEUMOCOCCAL VAC POLYVALENT 25 MCG/0.5ML IJ INJ
0.5000 mL | INJECTION | INTRAMUSCULAR | Status: AC
  Administered 2012-03-03: 0.5 mL via INTRAMUSCULAR
  Filled 2012-03-02: qty 0.5

## 2012-03-02 NOTE — Interval H&P Note (Signed)
History and Physical Interval Note:  03/02/2012 8:00 AM  Angel Waller  has presented today for surgery, with the diagnosis of Heart block  The various methods of treatment have been discussed with the patient and family. After consideration of risks, benefits and other options for treatment, the patient has consented to  Procedure(s) (LRB) with comments: PERMANENT PACEMAKER INSERTION (N/A) as a surgical intervention .  The patient's history has been reviewed, patient examined, no change in status, stable for surgery.  I have reviewed the patient's chart and labs.  Questions were answered to the patient's satisfaction.     Hillis Range

## 2012-03-02 NOTE — Progress Notes (Signed)
 SUBJECTIVE: The patient is doing well today.  At this time, she denies chest pain, shortness of breath, or any new concerns.       . aspirin EC  81 mg Oral Daily  . atorvastatin  40 mg Oral q1800  .  ceFAZolin (ANCEF) IV  2 g Intravenous On Call  . [COMPLETED] chlorhexidine  60 mL Topical Once  . [COMPLETED] chlorhexidine  60 mL Topical Once  . estradiol  0.5 mg Oral Daily  . [COMPLETED] fentaNYL      . gentamicin irrigation  80 mg Irrigation On Call  . [COMPLETED] heparin      . [COMPLETED] lidocaine      . [COMPLETED] midazolam      . pneumococcal 23 valent vaccine  0.5 mL Intramuscular Tomorrow-1000  . sodium chloride  3 mL Intravenous Q12H  . [DISCONTINUED] chlorhexidine  60 mL Topical Once  . [DISCONTINUED] chlorhexidine  60 mL Topical Once  . [DISCONTINUED] enoxaparin (LOVENOX) injection  40 mg Subcutaneous Q24H  . [DISCONTINUED] metoprolol succinate  12.5 mg Oral BID      . sodium chloride    . sodium chloride 50 mL/hr at 03/02/12 0504  . sodium chloride    . sodium chloride    . [DISCONTINUED] sodium chloride 100 mL/hr at 03/01/12 0228    OBJECTIVE: Physical Exam: Filed Vitals:   03/02/12 0400 03/02/12 0500 03/02/12 0700 03/02/12 0728  BP: 132/58 124/51 147/56   Pulse:    67  Temp: 98.5 F (36.9 C)   98 F (36.7 C)  TempSrc: Oral   Oral  Resp:      Height:      Weight:      SpO2: 98%   97%    Intake/Output Summary (Last 24 hours) at 03/02/12 0757 Last data filed at 03/02/12 0504  Gross per 24 hour  Intake    790 ml  Output    400 ml  Net    390 ml    Telemetry reveals sinus rhythm with transient mobitz II second degree AV block  GEN- The patient is well appearing, alert and oriented x 3 today.   Head- normocephalic, atraumatic Eyes-  Sclera clear, conjunctiva pink Ears- hearing intact Oropharynx- clear Neck- supple, no JVP Lymph- no cervical lymphadenopathy Lungs- Clear to ausculation bilaterally, normal work of breathing Heart- Regular  rate and rhythm 2/6 SEM LUSB GI- soft, NT, ND, + BS Extremities- no clubbing, cyanosis, or edema  LABS: Basic Metabolic Panel:  Basename 03/01/12 0619 02/29/12 1742 02/29/12 0954  NA 140 -- 138  K 4.1 -- 3.4*  CL 110 -- 101  CO2 24 -- 27  GLUCOSE 89 -- 100*  BUN 11 -- 16  CREATININE 0.63 0.71 --  CALCIUM 8.6 -- 9.5  MG -- -- --  PHOS -- -- --   Liver Function Tests: No results found for this basename: AST:2,ALT:2,ALKPHOS:2,BILITOT:2,PROT:2,ALBUMIN:2 in the last 72 hours No results found for this basename: LIPASE:2,AMYLASE:2 in the last 72 hours CBC:  Basename 02/29/12 1742 02/29/12 0954  WBC 7.2 7.8  NEUTROABS -- --  HGB 13.7 14.9  HCT 40.6 44.4  MCV 90.6 91.7  PLT 180 186   Cardiac Enzymes:  Basename 03/01/12 0619 02/29/12 2323 02/29/12 1731  CKTOTAL -- -- --  CKMB -- -- --  CKMBINDEX -- -- --  TROPONINI <0.30 <0.30 <0.30   BNP: No components found with this basename: POCBNP:3 D-Dimer: No results found for this basename: DDIMER:2 in the last 72 hours   Hemoglobin A1C: No results found for this basename: HGBA1C in the last 72 hours Fasting Lipid Panel: No results found for this basename: CHOL,HDL,LDLCALC,TRIG,CHOLHDL,LDLDIRECT in the last 72 hours Thyroid Function Tests:  Basename 02/29/12 1742  TSH 1.321  T4TOTAL --  T3FREE --  THYROIDAB --   Anemia Panel: No results found for this basename: VITAMINB12,FOLATE,FERRITIN,TIBC,IRON,RETICCTPCT in the last 72 hours  RADIOLOGY: Dg Chest 2 View  02/29/2012  *RADIOLOGY REPORT*  Clinical Data: Fall, L O C  CHEST - 2 VIEW  Comparison: None.  Findings: Cardiomediastinal silhouette is unremarkable.  Status post median sternotomy.  No acute infiltrate or pleural effusion. No pulmonary edema.  Mild degenerative changes thoracic spine.  IMPRESSION: No active disease.  Status post median sternotomy.   Original Report Authenticated By: Liviu Pop, M.D.    Ct Head Wo Contrast  02/29/2012  *RADIOLOGY REPORT*  Clinical Data:  Dizziness followed by trauma  CT HEAD WITHOUT CONTRAST  Technique:  Contiguous axial images were obtained from the base of the skull through the vertex without contrast. Study was performed within 24 hours of patient arrival at the emergency department.  Comparison: None.  Findings:  Ventricles are normal in size and configuration.  There is no mass, hemorrhage, extra-axial fluid collection, or midline shift. No evidence of acute infarct.  The gray-white compartments are normal.  Bony calvarium appears intact.  The mastoid air cells are clear. There is mucosal thickening in the sphenoid sinus region.  Impression:  Mild sphenoid sinus disease.  Study otherwise unremarkable.   Original Report Authenticated By: William Woodruff, M.D.     ASSESSMENT AND PLAN:  Principal Problem:  *Syncope Active Problems:  AORTIC VALVE REPLACEMENT, HX OF  Hypokalemia  Sinus pause  AV block 1. Mobitz II second degree AV block with syncope Persists off of AV nodal agents.   I agree with Dr Taylor that it is prudent to proceed with pacemaker implantation at this time.  Risks, benefits, alternatives to pacemaker implantation were discussed in detail with the patient today. The patient understands that the risks include but are not limited to bleeding, infection, pneumothorax, perforation, tamponade, vascular damage, renal failure, MI, stroke, death,  and lead dislodgement and wishes to proceed. We will therefore schedule the procedure at the next available time.     Tyge Somers, MD 03/02/2012 7:57 AM  

## 2012-03-02 NOTE — Op Note (Signed)
SURGEON:  Hillis Range, MD     PREPROCEDURE DIAGNOSIS:  Symptomatic mobitz II second degree AV block and syncope    POSTPROCEDURE DIAGNOSIS:  Symptomatic mobitz II second degree AV block and syncope     PROCEDURES:   1. Left upper extremity venography.   2. Pacemaker implantation.     INTRODUCTION: Angel Waller is a 66 y.o. female  with a history of symptomatic mobitz II second degree AV block and syncope who presents today for pacemaker implantation.  She presented following abrupt syncope.  She is observed to have transient AV block with prolonged RR intervals as the caused.  Metoprolol has been discontinued, however her intermittent AV block persists.   The patient therefore presents today for pacemaker implantation.     DESCRIPTION OF PROCEDURE:  Informed written consent was obtained, and the patient was brought to the electrophysiology lab in a fasting state.  The patient received IV Versed and Fentanyl as sedation for the procedure today.  The patients left chest was prepped and draped in the usual sterile fashion by the EP lab staff. The skin overlying the left deltopectoral region was infiltrated with lidocaine for local analgesia.  A 4-cm incision was made over the left deltopectoral region.  A left subcutaneous pacemaker pocket was fashioned using a combination of sharp and blunt dissection. Electrocautery was required to assure hemostasis.    Left Upper Extremity Venography: A venogram of the left upper extremity was performed, which revealed a large left axillary vein, which emptied into a large left subclavian vein.  The left cephalic vein was not visualized with the injection.    RA/RV Lead Placement: The left axillary vein was therefore cannulated.  Through the left axillary vein, a Medtronic model [id number] (serial number [id number]) right atrial lead and a Medtronic model 5092- 58 (serial number [id number]) right ventricular lead were advanced with fluoroscopic visualization into  the right atrial appendage and right ventricular apex positions respectively.  Initial atrial lead P- waves measured 4 mV with impedance of 691 ohms and a threshold of 1.0 V at 0.5 msec.  Right ventricular lead R-waves measured 8 mV with an impedance of 615 ohms and a threshold of 0.6 V at 0.5 msec.  Both leads   were secured to the pectoralis fascia using #2-0 silk over the suture sleeves.   Device Placement:  The leads were then connected to a Medtronic Adapta L model ADDRL 1 (serial number [id number]) pacemaker.  The pocket was irrigated with copious gentamicin solution.  The pacemaker was then placed into the pocket.  The pocket was then closed in 2 layers with 2.0 Vicryl suture for the subcutaneous and subcuticular layers.  Steri-Strips and a sterile dressing were then applied.  There were no early apparent complications.     CONCLUSIONS:   1. Successful implantation of a Medtronic Adapta L dual-chamber pacemaker for symptomatic AV block with syncope  2. No early apparent complications.           Hillis Range, MD 03/02/2012 12:06 PM

## 2012-03-02 NOTE — Progress Notes (Signed)
[facility]             Progress Note   Subjective: Patient seen very early this am. Is for PPM later today.  Objective: Vital signs in last 24 hours: Temp:  [98 F (36.7 C)-98.8 F (37.1 C)] 98 F (36.7 C) (12/05 0728) Pulse Rate:  [65-73] 67  (12/05 0728) Resp:  [18] 18  (12/04 1330) BP: (110-147)/(50-70) 144/55 mmHg (12/05 0800) SpO2:  [94 %-98 %] 97 % (12/05 0728) Weight change: 3.629 kg (8 lb) Last BM Date: 02/28/12  Intake/Output from previous day: 12/04 0701 - 12/05 0700 In: 860 [P.O.:480; I.V.:380] Out: 400 [Urine:400] Total I/O In: 140 [I.V.:140] Out: -    Physical Exam: General: Alert, awake, oriented x3, in no acute distress. HEENT: No bruits, no goiter. Facial bruises around eye and laceration above left eyelid. Heart: Regular rate and rhythm, without murmurs, rubs, gallops. Lungs: Clear to auscultation bilaterally. Abdomen: Soft, nontender, nondistended, positive bowel sounds. Extremities: No clubbing cyanosis or edema with positive pedal pulses. Neuro: Grossly intact, nonfocal.    Lab Results: Basic Metabolic Panel:  Basename 03/01/12 0619 02/29/12 1742 02/29/12 0954  NA 140 -- 138  K 4.1 -- 3.4*  CL 110 -- 101  CO2 24 -- 27  GLUCOSE 89 -- 100*  BUN 11 -- 16  CREATININE 0.63 0.71 --  CALCIUM 8.6 -- 9.5  MG -- -- --  PHOS -- -- --   CBC:  Basename 02/29/12 1742 02/29/12 0954  WBC 7.2 7.8  NEUTROABS -- --  HGB 13.7 14.9  HCT 40.6 44.4  MCV 90.6 91.7  PLT 180 186   Cardiac Enzymes:  Basename 03/01/12 0619 02/29/12 2323 02/29/12 1731  CKTOTAL -- -- --  CKMB -- -- --  CKMBINDEX -- -- --  TROPONINI <0.30 <0.30 <0.30   Thyroid Function Tests:  Basename 02/29/12 1742  TSH 1.321  T4TOTAL --  FREET4 --  T3FREE --  THYROIDAB --   Urinalysis:  Basename 02/29/12 1045  COLORURINE YELLOW  LABSPEC 1.007  PHURINE 5.5  GLUCOSEU NEGATIVE  HGBUR NEGATIVE  BILIRUBINUR NEGATIVE  KETONESUR NEGATIVE  PROTEINUR NEGATIVE   UROBILINOGEN 0.2  NITRITE NEGATIVE  LEUKOCYTESUR LARGE*    Recent Results (from the past 240 hour(s))  URINE CULTURE     Status: Normal   Collection Time   02/29/12 10:45 AM      Component Value Range Status Comment   Specimen Description URINE, CLEAN CATCH   Final    Special Requests NONE   Final    Culture  Setup Time 02/29/2012 11:37   Final    Colony Count NO GROWTH   Final    Culture NO GROWTH   Final    Report Status 03/01/2012 FINAL   Final   MRSA PCR SCREENING     Status: Normal   Collection Time   03/01/12  1:31 PM      Component Value Range Status Comment   MRSA by PCR NEGATIVE  NEGATIVE Final     Studies/Results: No results found.  Medications: Scheduled Meds:    . [facility] HOLD] aspirin EC  81 mg Oral Daily  . [facility] HOLD] atorvastatin  40 mg Oral q1800  . [COMPLETED] ceFAZolin      .  ceFAZolin (ANCEF) IV  2 g Intravenous On Call  . [COMPLETED] chlorhexidine  60 mL Topical Once  . [COMPLETED] chlorhexidine  60 mL Topical Once  . [facility] HOLD] estradiol  0.5 mg Oral Daily  . [COMPLETED]  fentaNYL      . [COMPLETED] fentaNYL      . gentamicin irrigation  80 mg Irrigation On Call  . [COMPLETED] heparin      . [COMPLETED] heparin      . [COMPLETED] lidocaine      . [COMPLETED] lidocaine      . [COMPLETED] midazolam      . [COMPLETED] midazolam      . [EXPIRED] pneumococcal 23 valent vaccine  0.5 mL Intramuscular Tomorrow-1000  . sodium chloride  3 mL Intravenous Q12H  . [DISCONTINUED] chlorhexidine  60 mL Topical Once  . [DISCONTINUED] chlorhexidine  60 mL Topical Once  . [DISCONTINUED] enoxaparin (LOVENOX) injection  40 mg Subcutaneous Q24H   Continuous Infusions:    . sodium chloride    . sodium chloride 50 mL/hr at 03/02/12 0504  . sodium chloride    . sodium chloride    . [DISCONTINUED] sodium chloride 100 mL/hr at 03/01/12 0228   PRN Meds:.[[facility] HOLD] acetaminophen, [MAR HOLD] acetaminophen, [MAR HOLD] ALPRAZolam, [MAR HOLD] bisacodyl, [MAR HOLD]  ondansetron (ZOFRAN) IV, [MAR HOLD] ondansetron, sodium chloride  Assessment/Plan:  Active Problems:  AORTIC VALVE REPLACEMENT, HX OF  Hypokalemia  Syncope  Sinus pause  AV block   Syncope -With Mobitz Type II on tele. -Will be going for PPM today. -Anticipate DC home soon.  Hypokalemia -Repleted. -Recheck in am.   Time spent coordinating care: 25 minutes.   LOS: 2 days   Chaya Jan [facility] Pager: [phone] 03/02/2012, 12:00 PM

## 2012-03-02 NOTE — H&P (View-Only) (Signed)
SUBJECTIVE: The patient is doing well today.  At this time, she denies chest pain, shortness of breath, or any new concerns.       Marland Kitchen aspirin EC  81 mg Oral Daily  . atorvastatin  40 mg Oral q1800  .  ceFAZolin (ANCEF) IV  2 g Intravenous On Call  . [COMPLETED] chlorhexidine  60 mL Topical Once  . [COMPLETED] chlorhexidine  60 mL Topical Once  . estradiol  0.5 mg Oral Daily  . [COMPLETED] fentaNYL      . gentamicin irrigation  80 mg Irrigation On Call  . [COMPLETED] heparin      . [COMPLETED] lidocaine      . [COMPLETED] midazolam      . pneumococcal 23 valent vaccine  0.5 mL Intramuscular Tomorrow-1000  . sodium chloride  3 mL Intravenous Q12H  . [DISCONTINUED] chlorhexidine  60 mL Topical Once  . [DISCONTINUED] chlorhexidine  60 mL Topical Once  . [DISCONTINUED] enoxaparin (LOVENOX) injection  40 mg Subcutaneous Q24H  . [DISCONTINUED] metoprolol succinate  12.5 mg Oral BID      . sodium chloride    . sodium chloride 50 mL/hr at 03/02/12 0504  . sodium chloride    . sodium chloride    . [DISCONTINUED] sodium chloride 100 mL/hr at 03/01/12 0228    OBJECTIVE: Physical Exam: Filed Vitals:   03/02/12 0400 03/02/12 0500 03/02/12 0700 03/02/12 0728  BP: 132/58 124/51 147/56   Pulse:    67  Temp: 98.5 F (36.9 C)   98 F (36.7 C)  TempSrc: Oral   Oral  Resp:      Height:      Weight:      SpO2: 98%   97%    Intake/Output Summary (Last 24 hours) at 03/02/12 0757 Last data filed at 03/02/12 0504  Gross per 24 hour  Intake    790 ml  Output    400 ml  Net    390 ml    Telemetry reveals sinus rhythm with transient mobitz II second degree AV block  GEN- The patient is well appearing, alert and oriented x 3 today.   Head- normocephalic, atraumatic Eyes-  Sclera clear, conjunctiva pink Ears- hearing intact Oropharynx- clear Neck- supple, no JVP Lymph- no cervical lymphadenopathy Lungs- Clear to ausculation bilaterally, normal work of breathing Heart- Regular  rate and rhythm 2/6 SEM LUSB GI- soft, NT, ND, + BS Extremities- no clubbing, cyanosis, or edema  LABS: Basic Metabolic Panel:  Basename 03/01/12 0619 02/29/12 1742 02/29/12 0954  NA 140 -- 138  K 4.1 -- 3.4*  CL 110 -- 101  CO2 24 -- 27  GLUCOSE 89 -- 100*  BUN 11 -- 16  CREATININE 0.63 0.71 --  CALCIUM 8.6 -- 9.5  MG -- -- --  PHOS -- -- --   Liver Function Tests: No results found for this basename: AST:2,ALT:2,ALKPHOS:2,BILITOT:2,PROT:2,ALBUMIN:2 in the last 72 hours No results found for this basename: LIPASE:2,AMYLASE:2 in the last 72 hours CBC:  Basename 02/29/12 1742 02/29/12 0954  WBC 7.2 7.8  NEUTROABS -- --  HGB 13.7 14.9  HCT 40.6 44.4  MCV 90.6 91.7  PLT 180 186   Cardiac Enzymes:  Basename 03/01/12 0619 02/29/12 2323 02/29/12 1731  CKTOTAL -- -- --  CKMB -- -- --  CKMBINDEX -- -- --  TROPONINI <0.30 <0.30 <0.30   BNP: No components found with this basename: POCBNP:3 D-Dimer: No results found for this basename: DDIMER:2 in the last 72 hours  Hemoglobin A1C: No results found for this basename: HGBA1C in the last 72 hours Fasting Lipid Panel: No results found for this basename: CHOL,HDL,LDLCALC,TRIG,CHOLHDL,LDLDIRECT in the last 72 hours Thyroid Function Tests:  Basename 02/29/12 1742  TSH 1.321  T4TOTAL --  T3FREE --  THYROIDAB --   Anemia Panel: No results found for this basename: VITAMINB12,FOLATE,FERRITIN,TIBC,IRON,RETICCTPCT in the last 72 hours  RADIOLOGY: Dg Chest 2 View  02/29/2012  *RADIOLOGY REPORT*  Clinical Data: Fall, L O C  CHEST - 2 VIEW  Comparison: None.  Findings: Cardiomediastinal silhouette is unremarkable.  Status post median sternotomy.  No acute infiltrate or pleural effusion. No pulmonary edema.  Mild degenerative changes thoracic spine.  IMPRESSION: No active disease.  Status post median sternotomy.   Original Report Authenticated By: Natasha Mead, M.D.    Ct Head Wo Contrast  02/29/2012  *RADIOLOGY REPORT*  Clinical Data:  Dizziness followed by trauma  CT HEAD WITHOUT CONTRAST  Technique:  Contiguous axial images were obtained from the base of the skull through the vertex without contrast. Study was performed within 24 hours of patient arrival at the emergency department.  Comparison: None.  Findings:  Ventricles are normal in size and configuration.  There is no mass, hemorrhage, extra-axial fluid collection, or midline shift. No evidence of acute infarct.  The gray-white compartments are normal.  Bony calvarium appears intact.  The mastoid air cells are clear. There is mucosal thickening in the sphenoid sinus region.  Impression:  Mild sphenoid sinus disease.  Study otherwise unremarkable.   Original Report Authenticated By: Bretta Bang, M.D.     ASSESSMENT AND PLAN:  Principal Problem:  *Syncope Active Problems:  AORTIC VALVE REPLACEMENT, HX OF  Hypokalemia  Sinus pause  AV block 1. Mobitz II second degree AV block with syncope Persists off of AV nodal agents.   I agree with Dr Ladona Ridgel that it is prudent to proceed with pacemaker implantation at this time.  Risks, benefits, alternatives to pacemaker implantation were discussed in detail with the patient today. The patient understands that the risks include but are not limited to bleeding, infection, pneumothorax, perforation, tamponade, vascular damage, renal failure, MI, stroke, death,  and lead dislodgement and wishes to proceed. We will therefore schedule the procedure at the next available time.     Hillis Range, MD 03/02/2012 7:57 AM

## 2012-03-03 ENCOUNTER — Inpatient Hospital Stay (HOSPITAL_COMMUNITY): Payer: BC Managed Care – PPO

## 2012-03-03 LAB — CBC
Hemoglobin: 13.7 g/dL (ref 12.0–15.0)
MCH: 30.4 pg (ref 26.0–34.0)
MCHC: 33.7 g/dL (ref 30.0–36.0)
RDW: 12.8 % (ref 11.5–15.5)

## 2012-03-03 LAB — BASIC METABOLIC PANEL
BUN: 11 mg/dL (ref 6–23)
Calcium: 9.3 mg/dL (ref 8.4–10.5)
Creatinine, Ser: 0.67 mg/dL (ref 0.50–1.10)
GFR calc non Af Amer: 89 mL/min — ABNORMAL LOW (ref >90)
Glucose, Bld: 90 mg/dL (ref 70–99)
Potassium: 4.5 mEq/L (ref 3.5–5.1)

## 2012-03-03 NOTE — Progress Notes (Signed)
Patient: Angel Waller Date of Encounter: 03/03/2012, 9:10 AM Admit date: 02/29/2012     Subjective  Angel Waller is doing well this AM s/p dual chamber PPM yesterday. She denies CP or SOB.   Objective  Physical Exam: Vitals: BP 145/84  Pulse 61  Temp 98.1 F (36.7 C) (Oral)  Resp 18  Ht 5\' 3"  (1.6 m)  Wt 132 lb (59.875 kg)  BMI 23.38 kg/m2  SpO2 98% General: Well developed, well appearing 66 year old female in no acute distress. Neck: Supple. JVD not elevated. Lungs: Clear bilaterally to auscultation without wheezes, rales, or rhonchi. Breathing is unlabored. Heart: RRR S1 S2 without murmurs, rubs, or gallops.  Abdomen: Soft, non-distended. Extremities: No clubbing or cyanosis. No edema.  Distal pedal pulses are 2+ and equal bilaterally. Neuro: Alert and oriented X 3. Moves all extremities spontaneously. No focal deficits. Skin: Left upper chest/implant site intact without bleeding or hematoma.  Intake/Output:  Intake/Output Summary (Last 24 hours) at 03/03/12 0910 Last data filed at 03/03/12 0351  Gross per 24 hour  Intake    100 ml  Output      0 ml  Net    100 ml    Inpatient Medications:     . aspirin EC  81 mg Oral Daily  . atorvastatin  40 mg Oral q1800  . [COMPLETED] ceFAZolin      . [COMPLETED]  ceFAZolin (ANCEF) IV  1 g Intravenous Q6H  . estradiol  0.5 mg Oral Daily  . [COMPLETED] fentaNYL      . [COMPLETED] heparin      . [COMPLETED] lidocaine      . [COMPLETED] midazolam      . [EXPIRED] pneumococcal 23 valent vaccine  0.5 mL Intramuscular Tomorrow-1000  . pneumococcal 23 valent vaccine  0.5 mL Intramuscular Tomorrow-1000  . sodium chloride  3 mL Intravenous Q12H  . [DISCONTINUED]  ceFAZolin (ANCEF) IV  2 g Intravenous On Call  . [DISCONTINUED] gentamicin irrigation  80 mg Irrigation On Call  . [DISCONTINUED] sodium chloride  3 mL Intravenous Q12H      . [DISCONTINUED] sodium chloride    . [DISCONTINUED] sodium chloride 50 mL/hr at 03/02/12  0504  . [DISCONTINUED] sodium chloride    . [DISCONTINUED] sodium chloride      Labs:  [facility] 03/03/12 0614 03/01/12 0619  NA 142 140  K 4.5 4.1  CL 107 110  CO2 27 24  GLUCOSE 90 89  BUN 11 11  CREATININE 0.67 0.63  CALCIUM 9.3 8.6  MG -- --  PHOS -- --    Basename 03/03/12 0614 02/29/12 1742  WBC 6.4 7.2  NEUTROABS -- --  HGB 13.7 13.7  HCT 40.7 40.6  MCV 90.4 90.6  PLT 150 180    Basename 03/01/12 0619 02/29/12 2323 02/29/12 1731  CKTOTAL -- -- --  CKMB -- -- --  TROPONINI <0.30 <0.30 <0.30    Basename 02/29/12 1742  TSH 1.321  T4TOTAL --  T3FREE --  THYROIDAB --    Radiology/Diagnostics/Studies:  Chest x-ray: PPM in place with stable lead placement; no pneumothorax  Device interrogation: Performed this AM by industry shows normal PPM function with good battery status and stable lead parameters; no programming changes made    Assessment and Plan  1. Complete heart block s/p dual chamber PPM implant yesterday Angel Waller is doing well s/p PPM. Her implant site is intact without bleeding or hematoma. Her device interrogation shows normal PPM function. Her  chest x-ray shows stable lead placement without pneumothorax. She will follow-up in 10 days for wound check and with Dr. Johney Frame in 3 months. She was instructed not to drive for 6 weeks. She will be scheduled with Dr. Olga Millers in the next few weeks to establish care for valvular heart disease follow-up.   Signed, EDMISTEN, BROOKE PA-C  Hillis Range, MD

## 2012-03-03 NOTE — Discharge Summary (Signed)
Physician Discharge Summary  Patient ID: Angel Waller MRN: [id number] DOB/AGE: 12-21-1945 66 y.o.  Admit date: 02/29/2012 Discharge date: 03/03/2012  Primary Care Physician:  Marga Melnick, MD  Cardiology : New, Dr. Jens Som  EP: Dr.Allred   Disposition and Follow-up:  1. Follow up with [facility] On 03/13/2012. (At 10:30 AM for wound check)  [phone]   2. FU with Hillis Range, MD. On 06/05/2012. (At 10:00 AM for EP/pacemaker follow-up)   [phone]  3.  FU Olga Millers, MD. On 04/07/2012. (At 10:30 AM to establish care for valvular heart disease follow-up)    Discharge Diagnoses:   Syncope Symptomatic mobitz II second degree AV block   Sinus pause S/p Medtronic Adapta L dual-chamber pacemaker  AORTIC VALVE REPLACEMENT, HX OF, tissue valve in 2003  Hypokalemia     Medication List     As of 03/03/2012 12:13 PM    STOP taking these medications         metoprolol succinate 25 MG 24 hr tablet   Commonly known as: TOPROL-XL      TAKE these medications         amoxicillin 500 MG capsule   Commonly known as: AMOXIL   Take 500 mg by mouth. 4 pills 60 min prior to dental procedure      aspirin 81 MG tablet   Take 81 mg by mouth daily.      atorvastatin 40 MG tablet   Commonly known as: LIPITOR   Take 40 mg by mouth daily.      estradiol 0.5 MG tablet   Commonly known as: ESTRACE   Take 0.5 mg by mouth daily.      norethindrone 5 MG tablet   Commonly known as: AYGESTIN   Take 5 mg by mouth. Every 4 months x 15 days      omega-3 acid ethyl esters 1 G capsule   Commonly known as: LOVAZA   Take 1 g by mouth. 2 by mouth two times a day      Vitamin D3 2000 UNITS Tabs   Take by mouth daily.         Consults:  Dr.Allred, EP   Significant Diagnostic Studies:    Procedure: PREPROCEDURE DIAGNOSIS: Symptomatic mobitz II second degree AV block and syncope  POSTPROCEDURE DIAGNOSIS: Symptomatic mobitz II second degree AV block and syncope   PROCEDURES:  1. Left upper extremity venography.  2. Pacemaker implantation.     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 She states she had lost consciousness-per witnesses for about 2 seconds, fallen to the concrete floor and sustained a laceration over her left upper eyelid. There was no jerking activity or incontinence reported .She denies chest pain, palpitations and no focal weakness. She states that about a week or so ago she had a another syncopal episode at a football game-reports that she had some palpitations prior to that episode. For the last episode she reports she was checked out by EMS but did not see an M.D.  In the ED today she had a CT scan of her head which did not show any acute intracranial findings, EKG showed normal sinus rhythm at 66 with no acute ischemic findings. She is admitted for further  evaluation and management.    Hospital Course: 66 y/o F with history of tissue AVR 2003, HL who presented to [facility] with two episodes of syncope. Subsequently on telemtry discovered to have  1. Mobitz II second degree AV block with syncope  Persists off of AV nodal agents, was seen by EP, they implanted a Medtronic Adapta L dual-chamber pacemaker . She tolerated the procedure well and did not have any complications. Discharged home in stable condition with instructions, driving restriction for 6 weeks and quick follow up with Dr.Allred and wound check    Time spent on Discharge:  Signed: Nyriah Coote [facility]  03/03/2012, 12:13 PM

## 2012-03-05 ENCOUNTER — Telehealth: Payer: Self-pay | Admitting: Physician Assistant

## 2012-03-05 NOTE — Telephone Encounter (Signed)
Pt called answering service on Sunday night. Got pacemaker this week. Today she has noticed occasional episodes where she feels like the pacemaker picks up and "corrects itself." For a brief time she feels panicky. She thinks her heart might race briefly but has not checked her pulse and is not sure. It does not last long. She feels well otherwise. It happened after she ate. No other symptoms like SOB, CP, diaphoresis, lightheadedness or syncope. I told her that a pacemaker usually kicks in when the HR is too slow, and doesn't typically cause someone's heart to race if functioning normally - however, you can have certain arrhythmias that can cause this. She otherwise feels well. I gave her 2 options: if feeling poorly and worried about her symptoms, can proceed to the ER, otherwise can call the office tomorrow morning to set up time to have pacemaker interrogated. She verbalized understanding and gratitude and plans to call the office tomorrow. Dayna Dunn  PA-C

## 2012-03-07 ENCOUNTER — Encounter: Payer: Self-pay | Admitting: Internal Medicine

## 2012-03-07 ENCOUNTER — Ambulatory Visit (INDEPENDENT_AMBULATORY_CARE_PROVIDER_SITE_OTHER): Payer: BC Managed Care – PPO | Admitting: Internal Medicine

## 2012-03-07 VITALS — BP 132/80 | HR 78 | Temp 97.5°F | Wt 132.8 lb

## 2012-03-07 DIAGNOSIS — Z4802 Encounter for removal of sutures: Secondary | ICD-10-CM

## 2012-03-07 DIAGNOSIS — Z950 Presence of cardiac pacemaker: Secondary | ICD-10-CM

## 2012-03-07 NOTE — Progress Notes (Signed)
  Subjective:    Patient ID: Angel Waller, female    DOB: Apr 26, 1945, 66 y.o.   MRN: [id number]  HPI Events reviewed; status post permanent pacemaker placement 12/ 07/2011 for syncope with left supraorbital laceration 02/29/12. Since discharge she noted palpitations 03/05/12 which have not recurred    Review of Systems She denies any headache, loss of consciousness, fever, chills, sweats, chest pain, shortness of breath, limb weakness or paresthesias.  There's been no evidence of cellulitis or purulence at the site of the laceration above the left eye.     Objective:   Physical Exam She is alert and well-nourished in no acute distress  Pupils are equally round reactive to light; extraocular motion is intact  The wound above and lateral to the left eye is well healed without cellulitis or purulence. Resolving ecchymosis below the left eye  She has no lymphadenopathy about the neck or axilla. The left axilla slightly tender related to the pacemaker insertion  She has a to and fro murmur at the left base with regular rhythm  7 fine sutures removed without difficulty        Assessment & Plan:  #1 suture removal accomplished without complication. No sign of infection  #2 status post permanent pacemaker insertion; rhythm is regular with normal rate.

## 2012-03-07 NOTE — Patient Instructions (Addendum)
Report  Warning Signs as discussed. 

## 2012-03-09 ENCOUNTER — Telehealth: Payer: Self-pay | Admitting: Internal Medicine

## 2012-03-09 NOTE — Telephone Encounter (Signed)
plz return call to pt [phone] regarding return to work documentation

## 2012-03-09 NOTE — Telephone Encounter (Signed)
I spoke with patient and let her know to bring the paper work in on Monday and Dr Johney Frame will be glad to fill it out

## 2012-03-13 ENCOUNTER — Encounter: Payer: Self-pay | Admitting: Internal Medicine

## 2012-03-13 ENCOUNTER — Telehealth: Payer: Self-pay

## 2012-03-13 ENCOUNTER — Ambulatory Visit (INDEPENDENT_AMBULATORY_CARE_PROVIDER_SITE_OTHER): Payer: BC Managed Care – PPO | Admitting: *Deleted

## 2012-03-13 DIAGNOSIS — I455 Other specified heart block: Secondary | ICD-10-CM

## 2012-03-13 DIAGNOSIS — R55 Syncope and collapse: Secondary | ICD-10-CM

## 2012-03-13 LAB — PACEMAKER DEVICE OBSERVATION
AL AMPLITUDE: 1 mv
BATTERY VOLTAGE: 2.81 V
RV LEAD AMPLITUDE: 8 mv
RV LEAD IMPEDENCE PM: 662 Ohm
VENTRICULAR PACING PM: 9

## 2012-03-13 NOTE — Progress Notes (Signed)
Wound check-PPM 

## 2012-03-13 NOTE — Telephone Encounter (Signed)
Message copied by Maurice Small on Mon Mar 13, 2012  5:46 PM ------      Message from: Pecola Lawless      Created: Sun Mar 12, 2012  5:14 PM       Lovaza probably not necessary with HDL 50 & TG 55 05/13/11 (see form from [employer]

## 2012-03-13 NOTE — Telephone Encounter (Signed)
Per note from insurance company (sent to be scanned) provider will need to provide more information than what is indicated on rx if patient to continue medication.   Per Dr.Hopper Lovaza probably not necessary based on last values, spoke with patient. Patient verbalized understanding of info, patient states rx was originally rx'ed by her previous provider before she established with Dr.Hopper and it is fairly expensive so she will d/c after current supply used and take OTC fish oil.

## 2012-04-03 ENCOUNTER — Telehealth: Payer: Self-pay | Admitting: Internal Medicine

## 2012-04-03 NOTE — Telephone Encounter (Signed)
Pt rtn call to [facility], pt went back to work 03-31-12 for dr allred to sign papers , pls call [phone] or [phone]

## 2012-04-06 NOTE — Telephone Encounter (Signed)
Pt. states that her paperwork for short term disability and FMLA must be faxed to [phone] by 04/12/12. Note forwarded to [facility], Dr. Jenel Lucks nurse.

## 2012-04-06 NOTE — Telephone Encounter (Signed)
Follow-up:    Patient called in to follow-up on her paper work request.  Please call back.

## 2012-04-07 ENCOUNTER — Encounter: Payer: Self-pay | Admitting: Cardiology

## 2012-04-07 ENCOUNTER — Ambulatory Visit (INDEPENDENT_AMBULATORY_CARE_PROVIDER_SITE_OTHER): Payer: Medicare Other | Admitting: Cardiology

## 2012-04-07 VITALS — BP 140/80 | HR 80 | Ht 63.5 in | Wt 135.8 lb

## 2012-04-07 DIAGNOSIS — Z950 Presence of cardiac pacemaker: Secondary | ICD-10-CM

## 2012-04-07 DIAGNOSIS — E785 Hyperlipidemia, unspecified: Secondary | ICD-10-CM

## 2012-04-07 DIAGNOSIS — Z952 Presence of prosthetic heart valve: Secondary | ICD-10-CM

## 2012-04-07 DIAGNOSIS — R55 Syncope and collapse: Secondary | ICD-10-CM

## 2012-04-07 DIAGNOSIS — Z954 Presence of other heart-valve replacement: Secondary | ICD-10-CM

## 2012-04-07 NOTE — Assessment & Plan Note (Signed)
No further episodes following pacemaker placement.

## 2012-04-07 NOTE — Assessment & Plan Note (Signed)
Management per primary care. 

## 2012-04-07 NOTE — Assessment & Plan Note (Signed)
Management per electrophysiology. 

## 2012-04-07 NOTE — Patient Instructions (Addendum)
Your physician wants you to follow-up in: 6 MONTHS WITH DR CRENSHAW You will receive a reminder letter in the mail two months in advance. If you don't receive a letter, please call our office to schedule the follow-up appointment.  

## 2012-04-07 NOTE — Assessment & Plan Note (Signed)
Continue SBE prophylaxis. Patient has a significant diastolic murmur on examination. Recent echo suggested mild AI. She states she was told at [facility] that she may require percutaneous seal for perivalvular leak. I will obtain all of her records from [facility] and we will also plan followup echocardiograms in the future. She has no indication of hemolysis as her recent hemoglobin was normal.

## 2012-04-07 NOTE — Progress Notes (Signed)
   HPI: 67 year old female for followup of aortic valve replacement and recent pacemaker. She had aortic valve replacement in 2010 at [facility]; apparently preop cath revealed no CAD. She was recently admitted with syncope. On telemetry she was noted to have Mobitz 2 second-degree AV block. She underwent pacemaker placement. Echocardiogram in December of 2013 showed normal LV function. There was a prosthetic aortic valve with mild aortic insufficiency. Since she was discharged there is no dyspnea, chest pain, palpitations or recurrent syncope.  Current Outpatient Prescriptions  Medication Sig Dispense Refill  . amoxicillin (AMOXIL) 500 MG capsule Take 500 mg by mouth. 4 pills 60 min prior to dental procedure      . aspirin 81 MG tablet Take 81 mg by mouth daily.      Marland Kitchen atorvastatin (LIPITOR) 40 MG tablet Take 40 mg by mouth daily.      . Cholecalciferol (VITAMIN D3) 2000 UNITS TABS Take by mouth daily.      Marland Kitchen estradiol (ESTRACE) 2 MG tablet Take 2 mg by mouth daily.      . norethindrone (AYGESTIN) 5 MG tablet Take 5 mg by mouth. Every 4 months x 15 days      . omega-3 acid ethyl esters (LOVAZA) 1 G capsule Take 1 g by mouth. 2 by mouth two times a day         Past Medical History  Diagnosis Date  . Seasonal allergies   . Hyperlipemia   . S/P AVR (aortic valve replacement)     July 2010  . Migraines     "occasionally" (02/29/2012)  . Arthritis     "right knee" (02/29/2012)    Past Surgical History  Procedure Date  . Colonoscopy 2005    negative  . Tonsillectomy and adenoidectomy 1962  . Cardiac valve replacement 2010    tissue aortic valve  . Permanent pacemaker insertion 03/02/2012     Dr Allred    History   Social History  . Marital Status: Married    Spouse Name: N/A    Number of Children: N/A  . Years of Education: N/A   Occupational History  . Special Education Teacher    Social History Main Topics  . Smoking status: Former Smoker -- 0.5 packs/day for 42 years    Types:  Cigarettes  . Smokeless tobacco: Never Used     Comment: 02/29/2012 "quit smoking ~ 5 yr ago"  . Alcohol Use: No  . Drug Use: No  . Sexually Active: Yes   Other Topics Concern  . Not on file   Social History Narrative  . No narrative on file    ROS: some knee pain but no fevers or chills, productive cough, hemoptysis, dysphasia, odynophagia, melena, hematochezia, dysuria, hematuria, rash, seizure activity, orthopnea, PND, pedal edema, claudication. Remaining systems are negative.  Physical Exam: Well-developed well-nourished in no acute distress.  Skin is warm and dry.  HEENT is normal.  Neck is supple.  Chest is clear to auscultation with normal expansion.  Cardiovascular exam is regular rate and rhythm. 3/6 systolic murmur left sternal border. 2/6 diastolic murmur. Abdominal exam nontender or distended. No masses palpated. Extremities show no edema. neuro grossly intact

## 2012-04-18 ENCOUNTER — Telehealth: Payer: Self-pay | Admitting: Internal Medicine

## 2012-04-18 NOTE — Telephone Encounter (Signed)
FMLA/STD papers faxed to [employer] [phone] [facility] 04/18/12/KM

## 2012-04-18 NOTE — Telephone Encounter (Signed)
Fax [phone] att [employer], [employer]  and short term disability  Form, they do not have it yet , per kelly it was faxed by kim in medical records, will send message for her to resend

## 2012-04-20 ENCOUNTER — Telehealth: Payer: Self-pay | Admitting: Internal Medicine

## 2012-04-20 NOTE — Telephone Encounter (Signed)
FMLA/STD paperwork Refaxed to [employer] @ [phone]  04/20/12/KM

## 2012-04-24 ENCOUNTER — Encounter: Payer: Self-pay | Admitting: *Deleted

## 2012-05-04 ENCOUNTER — Ambulatory Visit: Payer: BC Managed Care – PPO

## 2012-05-04 ENCOUNTER — Ambulatory Visit: Payer: BC Managed Care – PPO | Admitting: Internal Medicine

## 2012-05-04 ENCOUNTER — Ambulatory Visit (INDEPENDENT_AMBULATORY_CARE_PROVIDER_SITE_OTHER): Payer: BC Managed Care – PPO | Admitting: Emergency Medicine

## 2012-05-04 VITALS — BP 144/70 | HR 72 | Temp 97.8°F | Resp 16 | Ht 62.5 in | Wt 134.4 lb

## 2012-05-04 DIAGNOSIS — M25569 Pain in unspecified knee: Secondary | ICD-10-CM

## 2012-05-04 DIAGNOSIS — M25469 Effusion, unspecified knee: Secondary | ICD-10-CM

## 2012-05-04 MED ORDER — TRAMADOL HCL 50 MG PO TABS: 50.0000 mg | ORAL_TABLET | Freq: Three times a day (TID) | ORAL | Status: DC | PRN

## 2012-05-04 NOTE — Progress Notes (Signed)
Subjective:    Patient ID: Angel Waller, female    DOB: 1945-11-10, 67 y.o.   MRN: [id number]  HPI   Angel Waller is a very pleasant 67 yr old female here with sudden onset of right knee pain today.  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  Unable to walk due to the pain.  Noted swelling as well.  States the knee feels "tight" when she tries to bend it.  Does endorse some knee progressive knee pain over the last several months, occasionally catching, attributed this to arthritis.  Denies redness, warmth, bruising.  No fever or chills.  No pain in hip or ankle.  No pain in calf.  Occasionally feels pain up into the medial thigh.  Denies numbness or tingling.    Made an appt with Dr. Charlann Boxer today for March 13th, was unable to get anything sooner.     Review of Systems  Constitutional: Negative for fever and chills.  HENT: Negative.   Respiratory: Negative.   Cardiovascular: Negative.   Gastrointestinal: Negative.   Musculoskeletal: Positive for arthralgias (right knee).  Skin: Negative.  Negative for color change.  Neurological: Negative.  Negative for weakness and numbness.       Objective:   Physical Exam  Vitals reviewed. Constitutional: She is oriented to person, place, and time. She appears well-developed and well-nourished. No distress.  HENT:  Head: Normocephalic and atraumatic.  Eyes: Conjunctivae normal are normal. No scleral icterus.  Pulmonary/Chest: Effort normal.  Musculoskeletal:       Right hip: Normal.       Right knee: She exhibits decreased range of motion and effusion. She exhibits no ecchymosis, no deformity, no erythema, normal alignment, no LCL laxity, no bony tenderness and no MCL laxity. tenderness found. Medial joint line tenderness noted. No lateral joint line and no patellar tendon tenderness noted.       Left knee: Normal.       Right ankle: Normal.       Right lower leg: Normal.       Right  knee:  Effusion present (knee circ 15cm on right, 14.5cm on left); ROM limited due to pain compared with L knee; TTP over medial joint line; no ligamentous laxity; McMurray's negative but with some increased pain  Neurological: She is alert and oriented to person, place, and time. She has normal strength. No sensory deficit.  Skin: Skin is warm and dry.  Psychiatric: She has a normal mood and affect. Her behavior is normal.     Filed Vitals:   05/04/12 1248  BP: 144/70  Pulse: 72  Temp: 97.8 F (36.6 C)  Resp: 16     [facility] reading (PRIMARY) by  Dr. Dareen Piano - degenerative joint disease     Assessment & Plan:   1. Knee pain  DG Knee Complete 4 Views Right, traMADol (ULTRAM) 50 MG tablet  2. Knee swelling  DG Knee Complete 4 Views Right    Angel Waller is a very pleasant 67 yr old female here with right knee pain and effusion.  Exam reveals some TTP across the medial joint line, increased pain with McMurray's, but no pop or click.  No ligamentous laxity.  No popliteal pain.  X-ray shows degenerative joint disease but is otherwise normal.  Suspect meniscal tear based on history and exam.  Called Dr. Nilsa Nutting office to try to move up her appointment.  TL was told this  was not possible, but they will place her on the list to be called if their are cancellations.  Offered to place a referral to another orthopedist but pt declined, prefers to wait for Dr. Charlann Boxer.  Will treat pain with tramadol.  Pt states vicodin makes her loopy.  Encouraged elevation, rest, and frequent icing tonight.  Fitted with crutches to assist with ambulation.  If pt is worsening she will let us know.  If she would like to pursue eval with another ortho she will let us know as well and we can set that up.

## 2012-05-04 NOTE — Patient Instructions (Addendum)
Begin using tramadol every 8 hours as needed for pain.  You may use ibuprofen in addition to this if you are having breakthrough pain.  Tonight elevate the knee, rest, ice.  I would ice frequently tonight - 20 minutes on 20 minutes off.  Make sure you keep something between your skin and the ice.  You crutches as needed.  Wear good supportive shoes.  When using stairs - up with the good leg, down with the bad leg.  Activity as tolerated.  You are on the cancellation list for Dr. Charlann Boxer, if you decide you would like to see someone else sooner, let us know and we can get that set up for you.  If you feel like you are worsening, please let us know.

## 2012-05-16 ENCOUNTER — Encounter: Payer: Self-pay | Admitting: Lab

## 2012-05-17 ENCOUNTER — Ambulatory Visit (INDEPENDENT_AMBULATORY_CARE_PROVIDER_SITE_OTHER): Payer: BC Managed Care – PPO | Admitting: Internal Medicine

## 2012-05-17 ENCOUNTER — Encounter: Payer: Self-pay | Admitting: Internal Medicine

## 2012-05-17 VITALS — BP 110/72 | HR 80 | Temp 97.9°F | Resp 12 | Ht 63.0 in | Wt 133.2 lb

## 2012-05-17 DIAGNOSIS — M25461 Effusion, right knee: Secondary | ICD-10-CM

## 2012-05-17 DIAGNOSIS — Z954 Presence of other heart-valve replacement: Secondary | ICD-10-CM

## 2012-05-17 DIAGNOSIS — E785 Hyperlipidemia, unspecified: Secondary | ICD-10-CM

## 2012-05-17 DIAGNOSIS — M1711 Unilateral primary osteoarthritis, right knee: Secondary | ICD-10-CM

## 2012-05-17 DIAGNOSIS — Z0000 Encounter for general adult medical examination without abnormal findings: Secondary | ICD-10-CM

## 2012-05-17 DIAGNOSIS — M1710 Unilateral primary osteoarthritis, unspecified knee: Secondary | ICD-10-CM

## 2012-05-17 DIAGNOSIS — M25469 Effusion, unspecified knee: Secondary | ICD-10-CM

## 2012-05-17 LAB — HEPATIC FUNCTION PANEL
ALT: 16 U/L (ref 0–35)
AST: 28 U/L (ref 0–37)
Albumin: 3.6 g/dL (ref 3.5–5.2)
Total Protein: 7.2 g/dL (ref 6.0–8.3)

## 2012-05-17 LAB — LIPID PANEL: Cholesterol: 155 mg/dL (ref 0–200)

## 2012-05-17 NOTE — Progress Notes (Signed)
  Subjective:    Patient ID: Angel Waller, female    DOB: 1945/09/28, 67 y.o.   MRN: [id number]  HPI Angel Waller is here for a physical;acute issues include R knee pain.     Review of Systems The pain began several years ago; worse past month. No associated injury or trigger noted. It is described as aching up to level 7 intermittently. The pain does not radiates up to R medial thigh.  The discomfort lasts  minutes . It is exacerbated by ambulation .  No associated  redness ,stiffness, skin color or change in temperature. The pain was treated with orthodics,NSAIDS, ice, & elevation with partial response.   Constitutional: no fever, chills, sweats, change in weight  Musculoskeletal:no  muscle cramps or pain Neuro: no weakness; incontinence (stool/urine); numbness and tingling Heme:no lymphadenopathy; abnormal bruising or bleeding   Recent mild nasal congestion w/o fever or significant purulence.     Objective:   Physical Exam Gen.: Thin but healthy and well-nourished in appearance. Alert, appropriate and cooperative throughout exam. Appears younger than stated age  Head: Normocephalic without obvious abnormalities  Eyes: No corneal or conjunctival inflammation noted. Extraocular motion intact. Vision grossly normal without  lenses Ears: External  ear exam reveals no significant lesions or deformities. Canals clear .TMs normal. Hearing is grossly normal bilaterally. Nose: External nasal exam reveals no deformity or inflammation. Nasal mucosa are pink and moist. No lesions or exudates noted.  Mouth: Oral mucosa and oropharynx reveal no lesions or exudates. Teeth in good repair. Neck: No deformities, masses, or tenderness noted. Range of motion normal. Thyroid small. Lungs: Normal respiratory effort; chest expands symmetrically. Lungs are clear to auscultation without rales, wheezes, or increased work of breathing. Heart: Normal rate and rhythm. Normal S1 and S2. No gallop, click, or  rub. Grade 2/6 harsh systolic murmur with R carotid & aortic radiation. Abdomen: Bowel sounds normal; abdomen soft and nontender. No masses, organomegaly or hernias noted.Aorta palpable ; no AAA Genitalia: As per Gyn                                  Musculoskeletal/extremities: Slightly accentuated curvature of upper thoracic spine. No clubbing, cyanosis, or edema noted. Range of motion decreased R knee. Fusiform change with patellar effusion of right knee .Tone & strength  Normal. Nail health good.Able to lie down & sit up w/o help. Negative SLR bilaterally Vascular: Carotid, radial artery, dorsalis pedis and  posterior tibial pulses are full and equal.  Neurologic: Alert and oriented x3. Deep tendon reflex slightly decreased R knee.     Skin: Intact without suspicious lesions or rashes. Lymph: No cervical, axillary lymphadenopathy present. Psych: Mood and affect are normal. Normally interactive                                                                                      Assessment & Plan:  #1 comprehensive physical exam; no acute findings  #2 right knee pain associated with significant patellar effusion and decreased range of motion.  Plan: see Orders  & Recommendations

## 2012-05-17 NOTE — Patient Instructions (Addendum)
Preventive Health Care: Exercise  30-45  minutes a day, 3-4 days a wee ed AFTER knee issue addressd. Walking is especially valuable in preventing Osteoporosis. Eat a low-fat diet with lots of fruits and vegetables, up to 7-9 servings per day. This would eliminate need for vitamin supplements for most individuals. Consume less than 30 grams of sugar per day from foods & drinks with High Fructose Corn Syrup as # 1,2,3 or #4 on label. The legal document "Health Care Power of Attorney & Living Will " verifies decisions concerning your health care. Use an anti-inflammatory cream such as Aspercreme or Zostrix cream twice a day to the  knee as needed. In lieu of this warm moist compresses or  hot water bottle can be used. Do not apply ice to the knee. Zicam Melts or Zinc lfacozenges for scratchy throay ; vitamin C 2000 mg daily; & Echinacea for 4-7 days. Report persistent or progressive  fever, exudate("pus") or frontal headache or ial  pain. Plain Mucinex (NOT D) for thick secretions ;force NON dairy fluids .   Nasal cleansing in the shower as discussed with lather of mild shampoo.After 10 seconds wash off lather while  exhaling through nostrils. Make sure that all residual soap is removed to prevent irritation.  Use a Neti pot daily only  as needed for significant sinus congestion; going from open side to congested side . Plain Allegra (NOT D )  160 daily , Loratidine 10 mg , OR Zyrtec 10 mg @ bedtime  as needed for itchy eyes & sneezing.        If you activate the  My Chart system; lab & Xray results will be released directly  to you as soon as I review & address these through the computer. As per [place] all records must be reviewed and signed off within 72 hours; but I attempt to complete this within 36 hours unless I have no access to the electronic medical record.If I wait more than 36 hours the volume of reports becomes difficult to manage optimally. In my absence ;my partners would address the  results.Critical lab results will be communicated to you ASAP. If you choose not to sign up for My Chart within 36 hours of labs being drawn; results will be reviewed & interpretation added before being copied & mailed, causing a delay in getting the results to you.If you do not receive that report within 7-10 days ,please call. Additionally you can use this system to gain direct  access to your records  if  out of town or @ an office of a  physician who is not in  the My Chart network.  This improves continuity of care & places you in control of your medical record.

## 2012-05-18 ENCOUNTER — Telehealth: Payer: Self-pay

## 2012-05-18 NOTE — Telephone Encounter (Signed)
Message copied by Maurice Small on Thu May 18, 2012  8:48 AM ------      Message from: Pecola Lawless      Created: Wed May 17, 2012  6:41 PM       By mistake lab results were filed before explanatory notes could be applied. Hardcopy will be mailed.      All normal. ------

## 2012-05-18 NOTE — Telephone Encounter (Signed)
Left message on VM for patient to return call when available  

## 2012-05-19 NOTE — Telephone Encounter (Signed)
Spoke with patient. Patient verbalized all results normal.   Patient states she was told to call if she developed any discolored discharge, patient is not with a lot of yellow nasal drainage. Patient would like to know if Alfonse Flavors would send something in for her . [facility] in [place] please advise

## 2012-05-19 NOTE — Telephone Encounter (Signed)
Patient returned your call. CB# [phone]

## 2012-05-22 MED ORDER — AMOXICILLIN 500 MG PO CAPS: 500.0000 mg | ORAL_CAPSULE | Freq: Three times a day (TID) | ORAL | Status: DC

## 2012-05-22 NOTE — Telephone Encounter (Signed)
Left message on VM informing patient rx sent in, patient to call if questions or concerns

## 2012-05-22 NOTE — Telephone Encounter (Signed)
Amoxicillin 500 mg tid #21 

## 2012-06-05 ENCOUNTER — Ambulatory Visit (INDEPENDENT_AMBULATORY_CARE_PROVIDER_SITE_OTHER): Payer: BC Managed Care – PPO | Admitting: Internal Medicine

## 2012-06-05 ENCOUNTER — Encounter: Payer: Self-pay | Admitting: Internal Medicine

## 2012-06-05 VITALS — BP 131/88 | HR 67 | Wt 131.8 lb

## 2012-06-05 DIAGNOSIS — I4430 Unspecified atrioventricular block: Secondary | ICD-10-CM

## 2012-06-05 DIAGNOSIS — R55 Syncope and collapse: Secondary | ICD-10-CM

## 2012-06-05 DIAGNOSIS — Z950 Presence of cardiac pacemaker: Secondary | ICD-10-CM

## 2012-06-05 LAB — PACEMAKER DEVICE OBSERVATION
AL AMPLITUDE: 2.8 mv
ATRIAL PACING PM: 14.5
VENTRICULAR PACING PM: 97.6

## 2012-06-05 NOTE — Patient Instructions (Addendum)
Your physician wants you to follow-up in: 9 months with Dr Allred You will receive a reminder letter in the mail two months in advance. If you don't receive a letter, please call our office to schedule the follow-up appointment.  

## 2012-06-05 NOTE — Progress Notes (Signed)
PCP: Marga Melnick, MD Primary Cardiologist:  Dr Jens Som  Angel Waller is a 67 y.o. female who presents today for routine electrophysiology followup.  Since her recent pacemaker implantation, the patient reports doing very well.  Today, she denies symptoms of palpitations, chest pain, shortness of breath,  lower extremity edema, dizziness, presyncope, or syncope.  The patient is otherwise without complaint today.   Past Medical History  Diagnosis Date  . Seasonal allergies   . Hyperlipemia   . S/P AVR (aortic valve replacement)     July 2010  . Migraines     "occasionally" (02/29/2012)  . Arthritis     "right knee" (02/29/2012)  . Complete heart block    Past Surgical History  Procedure Laterality Date  . Colonoscopy  2005    negative; [place], [place]  . Tonsillectomy and adenoidectomy  1962  . Cardiac valve replacement  2010    tissue aortic valve; [facility]  . Permanent pacemaker insertion  03/02/2012    Medtronic Adapta L implanted by Dr Johney Frame    Current Outpatient Prescriptions  Medication Sig Dispense Refill  . aspirin 81 MG tablet Take 81 mg by mouth daily.      Marland Kitchen atorvastatin (LIPITOR) 40 MG tablet Take 40 mg by mouth daily.      . Cholecalciferol (VITAMIN D3) 2000 UNITS TABS Take by mouth daily.      Marland Kitchen estradiol (ESTRACE) 2 MG tablet Take 2 mg by mouth daily.      . norethindrone (AYGESTIN) 5 MG tablet Take 5 mg by mouth. Every 4 months x 10 days      . omega-3 acid ethyl esters (LOVAZA) 1 G capsule Take 1 g by mouth. 2 by mouth two times a day       No current facility-administered medications for this visit.    Physical Exam: Filed Vitals:   06/05/12 1006  BP: 131/88  Pulse: 67  Weight: 131 lb 12.8 oz (59.784 kg)    GEN- The patient is well appearing, alert and oriented x 3 today.   Head- normocephalic, atraumatic Eyes-  Sclera clear, conjunctiva pink Ears- hearing intact Oropharynx- clear Lungs- Clear to ausculation bilaterally, normal work of  breathing Chest- pacemaker pocket is well healed Heart- Regular rate and rhythm,2/6 SEM LUSB,  GI- soft, NT, ND, + BS Extremities- no clubbing, cyanosis, or edema  Pacemaker interrogation- reviewed in detail today,  See PACEART report  Assessment and Plan:  1. Complete heart block Normal pacemaker function See Pace Art report No changes today  2. Syncope Resolved s/p PPM  Return to the device clinic in 9 months

## 2012-06-19 ENCOUNTER — Other Ambulatory Visit: Payer: Self-pay | Admitting: *Deleted

## 2012-06-19 DIAGNOSIS — E785 Hyperlipidemia, unspecified: Secondary | ICD-10-CM

## 2012-06-19 MED ORDER — ATORVASTATIN CALCIUM 40 MG PO TABS: 40.0000 mg | ORAL_TABLET | Freq: Every day | ORAL | Status: DC

## 2012-06-19 NOTE — Telephone Encounter (Signed)
Refill for lipitor sent ot [facility]

## 2012-08-17 ENCOUNTER — Telehealth: Payer: Self-pay | Admitting: Cardiology

## 2012-08-17 NOTE — Telephone Encounter (Signed)
Spoke with pt, she is having orthoscopic knee procedure with dr Cornelius Moras. Confirmed with pt that she will need to take antibiotics prior to the procedure.

## 2012-08-17 NOTE — Telephone Encounter (Signed)
New problem     Pt has question about an antibiotic before a procedure

## 2012-10-18 ENCOUNTER — Telehealth: Payer: Self-pay | Admitting: Cardiology

## 2012-10-18 NOTE — Telephone Encounter (Signed)
New problem    Pt wants to know if she can take amoxicillin

## 2012-10-18 NOTE — Telephone Encounter (Signed)
Spoke with pt, she is having a knee scope and they told her she did not need the antibiotics. She wanted to confirm with [place]. Told the pt it would be fine for her to take just to be safe. She voiced understanding.

## 2012-10-20 ENCOUNTER — Telehealth: Payer: Self-pay | Admitting: Cardiology

## 2012-10-20 NOTE — Telephone Encounter (Signed)
Spoke with pt, okay given for her to use meloxicam while recovering from knee surgery.

## 2012-10-20 NOTE — Telephone Encounter (Signed)
New Prob      Pt has some questions regarding MELOXICAM. Please call.

## 2012-10-27 ENCOUNTER — Ambulatory Visit (INDEPENDENT_AMBULATORY_CARE_PROVIDER_SITE_OTHER): Payer: Medicare Other | Admitting: Cardiology

## 2012-10-27 ENCOUNTER — Encounter: Payer: Self-pay | Admitting: Cardiology

## 2012-10-27 VITALS — BP 146/68 | HR 60 | Ht 63.5 in | Wt 136.6 lb

## 2012-10-27 DIAGNOSIS — Z952 Presence of prosthetic heart valve: Secondary | ICD-10-CM

## 2012-10-27 DIAGNOSIS — I359 Nonrheumatic aortic valve disorder, unspecified: Secondary | ICD-10-CM

## 2012-10-27 DIAGNOSIS — Z950 Presence of cardiac pacemaker: Secondary | ICD-10-CM

## 2012-10-27 DIAGNOSIS — E785 Hyperlipidemia, unspecified: Secondary | ICD-10-CM

## 2012-10-27 DIAGNOSIS — Z954 Presence of other heart-valve replacement: Secondary | ICD-10-CM

## 2012-10-27 LAB — HAPTOGLOBIN: Haptoglobin: <25 mg/dL — ABNORMAL LOW (ref 45–215)

## 2012-10-27 LAB — CBC WITH DIFFERENTIAL/PLATELET
Basophils Relative: 0.4 % (ref 0.0–3.0)
Eosinophils Relative: 2.3 % (ref 0.0–5.0)
HCT: 36.6 % (ref 36.0–46.0)
Hemoglobin: 12.2 g/dL (ref 12.0–15.0)
MCV: 92.1 fl (ref 78.0–100.0)
Monocytes Absolute: 0.4 10*3/uL (ref 0.1–1.0)
Neutrophils Relative %: 72.6 % (ref 43.0–77.0)
RBC: 3.98 Mil/uL (ref 3.87–5.11)
WBC: 8.1 10*3/uL (ref 4.5–10.5)

## 2012-10-27 NOTE — Assessment & Plan Note (Signed)
Continue statin. 

## 2012-10-27 NOTE — Assessment & Plan Note (Signed)
Followed by electrophysiology. 

## 2012-10-27 NOTE — Patient Instructions (Addendum)
Your physician wants you to follow-up in: 6 MONTHS WITH DR CRENSHAW You will receive a reminder letter in the mail two months in advance. If you don't receive a letter, please call our office to schedule the follow-up appointment.   Your physician has requested that you have an echocardiogram. Echocardiography is a painless test that uses sound waves to create images of your heart. It provides your doctor with information about the size and shape of your heart and how well your heart's chambers and valves are working. This procedure takes approximately one hour. There are no restrictions for this procedure.   Your physician recommends that you HAVE LAB WORK TODAY 

## 2012-10-27 NOTE — Progress Notes (Signed)
   HPI: Pleasant female for followup of aortic valve replacement and previous pacemaker. She had aortic valve replacement in 2010 at [facility]; preop cath revealed no CAD. She underwent pacemaker placement for syncope and second degree AV block. Echocardiogram at [facility] in April of 2013 showed normal LV function, moderate aortic insufficiency and mild mitral regurgitation. Question partial valve dehiscence. Echocardiogram in December of 2013 showed normal LV function. There was a prosthetic aortic valve with mild aortic insufficiency. Since she was discharged there is no dyspnea, chest pain, palpitations or recurrent syncope.   Current Outpatient Prescriptions  Medication Sig Dispense Refill  . aspirin 325 MG tablet Take 325 mg by mouth daily.      Marland Kitchen atorvastatin (LIPITOR) 40 MG tablet Take 1 tablet (40 mg total) by mouth daily.  30 tablet  5  . Cholecalciferol (VITAMIN D3) 2000 UNITS TABS Take by mouth daily.      Marland Kitchen estradiol (ESTRACE) 2 MG tablet Take 2 mg by mouth daily.      Marland Kitchen HYDROcodone-acetaminophen (NORCO/VICODIN) 5-325 MG per tablet Take 1 tablet by mouth every 6 (six) hours as needed for pain.      Marland Kitchen norethindrone (AYGESTIN) 5 MG tablet Take 5 mg by mouth daily.      Marland Kitchen OVER THE COUNTER MEDICATION Take 2 tablets by mouth daily. Fish oil       No current facility-administered medications for this visit.     Past Medical History  Diagnosis Date  . Seasonal allergies   . Hyperlipemia   . S/P AVR (aortic valve replacement)     July 2010  . Migraines     "occasionally" (02/29/2012)  . Arthritis     "right knee" (02/29/2012)  . Complete heart block     Past Surgical History  Procedure Laterality Date  . Colonoscopy  2005    negative; [place], [place]  . Tonsillectomy and adenoidectomy  1962  . Cardiac valve replacement  2010    tissue aortic valve; [facility]  . Permanent pacemaker insertion  03/02/2012    Medtronic Adapta L implanted by Dr Johney Frame    History   Social History    . Marital Status: Married    Spouse Name: N/A    Number of Children: N/A  . Years of Education: N/A   Occupational History  . Special Education Teacher    Social History Main Topics  . Smoking status: Former Smoker -- 0.50 packs/day for 42 years    Types: Cigarettes    Quit date: 03/30/2007  . Smokeless tobacco: Never Used     Comment: smoked ages [age], up to 1 pp WEEK  . Alcohol Use: No  . Drug Use: No  . Sexually Active: Yes   Other Topics Concern  . Not on file   Social History Narrative  . No narrative on file    ROS: no fevers or chills, productive cough, hemoptysis, dysphasia, odynophagia, melena, hematochezia, dysuria, hematuria, rash, seizure activity, orthopnea, PND, pedal edema, claudication. Remaining systems are negative.  Physical Exam: Well-developed well-nourished in no acute distress.  Skin is warm and dry.  HEENT is normal.  Neck is supple.  Chest is clear to auscultation with normal expansion.  Cardiovascular exam is regular rate and rhythm. 3/6 systolic murmur left sternal border radiating to the carotids. 2/6 diastolic murmur. Abdominal exam nontender or distended. No masses palpated. Extremities show no edema. neuro grossly intact  ECG AV sequential pacemaker.

## 2012-10-27 NOTE — Assessment & Plan Note (Signed)
Continue SBE prophylaxis. Previous echocardiogram suggested moderate aortic insufficiency at [facility]. There was a question of partial valve dehiscence. Plan repeat echocardiogram. She may require percutaneous seal for perivalvular leak in the future. Check hemoglobin, haptoglobin and LDH to make sure that she is not hemolyzing.

## 2012-10-29 ENCOUNTER — Encounter: Payer: Self-pay | Admitting: Cardiology

## 2012-11-08 ENCOUNTER — Ambulatory Visit (HOSPITAL_COMMUNITY): Payer: Medicare Other | Attending: Cardiovascular Disease

## 2012-11-08 DIAGNOSIS — Z952 Presence of prosthetic heart valve: Secondary | ICD-10-CM

## 2012-11-08 DIAGNOSIS — Z87891 Personal history of nicotine dependence: Secondary | ICD-10-CM | POA: Insufficient documentation

## 2012-11-08 DIAGNOSIS — R55 Syncope and collapse: Secondary | ICD-10-CM | POA: Insufficient documentation

## 2012-11-08 DIAGNOSIS — I359 Nonrheumatic aortic valve disorder, unspecified: Secondary | ICD-10-CM | POA: Insufficient documentation

## 2012-11-08 NOTE — Progress Notes (Signed)
Echocardiogram performed.  

## 2012-11-10 NOTE — Progress Notes (Unsigned)
error 

## 2012-11-10 NOTE — Telephone Encounter (Signed)
F/up   Pt returning a call from Deb Mathis// Results from echo and lipid

## 2012-11-13 ENCOUNTER — Telehealth: Payer: Self-pay | Admitting: Cardiology

## 2012-11-13 NOTE — Telephone Encounter (Signed)
Per dr Jens Som, follow up appt made to discuss echo with pt

## 2012-11-13 NOTE — Telephone Encounter (Signed)
Pt rtn call to Angel Waller °

## 2012-11-29 ENCOUNTER — Ambulatory Visit (INDEPENDENT_AMBULATORY_CARE_PROVIDER_SITE_OTHER): Payer: Medicare Other | Admitting: Cardiology

## 2012-11-29 ENCOUNTER — Ambulatory Visit: Payer: BC Managed Care – PPO | Admitting: Cardiology

## 2012-11-29 ENCOUNTER — Encounter: Payer: Self-pay | Admitting: Cardiology

## 2012-11-29 VITALS — BP 142/53 | HR 70 | Ht 63.5 in | Wt 131.1 lb

## 2012-11-29 DIAGNOSIS — Z954 Presence of other heart-valve replacement: Secondary | ICD-10-CM

## 2012-11-29 DIAGNOSIS — Z950 Presence of cardiac pacemaker: Secondary | ICD-10-CM

## 2012-11-29 DIAGNOSIS — E785 Hyperlipidemia, unspecified: Secondary | ICD-10-CM

## 2012-11-29 DIAGNOSIS — Z952 Presence of prosthetic heart valve: Secondary | ICD-10-CM

## 2012-11-29 NOTE — Patient Instructions (Addendum)
Your physician wants you to follow-up in: 6 MONTHS WITH DR CRENSHAW You will receive a reminder letter in the mail two months in advance. If you don't receive a letter, please call our office to schedule the follow-up appointment.   Your physician recommends that you HAVE LAB WORK TODAY 

## 2012-11-29 NOTE — Assessment & Plan Note (Signed)
Continue SBE prophylaxis. Previous echocardiogram suggested moderate aortic insufficiency at [facility]. There was a question of partial valve dehiscence. Repeat echocardiogram here shows moderate perivalvular aortic insufficiency and an elevated gradient. Laboratories show mild decrease in hemoglobin compared to previous with elevated LDH and low haptoglobin suggesting hemolysis. We will repeat those laboratories. If they remain abnormal I will ask Dr. Earma Reading at [facility] to review. She may require percutaneous seal for perivalvular leak in the future.

## 2012-11-29 NOTE — Progress Notes (Signed)
HPI: Pleasant female for followup of aortic valve replacement, AI and previous pacemaker. She had aortic valve replacement in 2010 at [facility]; preop cath revealed no CAD. She underwent pacemaker placement for syncope and second degree AV block. Echocardiogram at [facility] in April of 2013 showed normal LV function, moderate aortic insufficiency and mild mitral regurgitation. Question partial valve dehiscence. Last echocardiogram in August of 2014 showed an ejection fraction of 45-50%. There was moderate perivalvular aortic insufficiency and an elevated mean gradient of 34 mm of mercury. There was mild mitral regurgitation. Hemoglobin 12.2, haptoglobin less than 25, LDH 351. Since last saw her she denies dyspnea, chest pain, palpitations or syncope.   Current Outpatient Prescriptions  Medication Sig Dispense Refill  . amoxicillin (AMOXIL) 500 MG capsule as needed.      Marland Kitchen aspirin 325 MG tablet Take 325 mg by mouth daily.      Marland Kitchen atorvastatin (LIPITOR) 40 MG tablet Take 1 tablet (40 mg total) by mouth daily.  30 tablet  5  . Cholecalciferol (VITAMIN D3) 2000 UNITS TABS Take by mouth daily.      Marland Kitchen estradiol (ESTRACE) 2 MG tablet Take 2 mg by mouth daily.      . norethindrone (AYGESTIN) 5 MG tablet Take 5 mg by mouth. Once Every 4 months for 10 days      . OVER THE COUNTER MEDICATION Take 2 tablets by mouth daily. Fish oil      . tretinoin microspheres (RETIN-A MICRO) 0.1 % gel        No current facility-administered medications for this visit.     Past Medical History  Diagnosis Date  . Seasonal allergies   . Hyperlipemia   . S/P AVR (aortic valve replacement)     July 2010  . Migraines     "occasionally" (02/29/2012)  . Arthritis     "right knee" (02/29/2012)  . Complete heart block     Past Surgical History  Procedure Laterality Date  . Colonoscopy  2005    negative; [place], [place]  . Tonsillectomy and adenoidectomy  1962  . Cardiac valve replacement  2010    tissue aortic  valve; [facility]  . Permanent pacemaker insertion  03/02/2012    Medtronic Adapta L implanted by Dr Johney Frame    History   Social History  . Marital Status: Married    Spouse Name: N/A    Number of Children: N/A  . Years of Education: N/A   Occupational History  . Special Education Teacher    Social History Main Topics  . Smoking status: Former Smoker -- 0.50 packs/day for 42 years    Types: Cigarettes    Quit date: 03/30/2007  . Smokeless tobacco: Never Used     Comment: smoked ages [age], up to 1 pp WEEK  . Alcohol Use: No  . Drug Use: No  . Sexual Activity: Yes   Other Topics Concern  . Not on file   Social History Narrative  . No narrative on file    ROS: no fevers or chills, productive cough, hemoptysis, dysphasia, odynophagia, melena, hematochezia, dysuria, hematuria, rash, seizure activity, orthopnea, PND, pedal edema, claudication. Remaining systems are negative.  Physical Exam: Well-developed well-nourished in no acute distress.  Skin is warm and dry.  HEENT is normal.  Neck is supple.  Chest is clear to auscultation with normal expansion.  Cardiovascular exam is regular rate and rhythm. 3/6 systolic and 2/6 diastolic murmur left sternal border. Abdominal exam nontender or distended. No  masses palpated. Extremities show no edema. neuro grossly intact

## 2012-11-29 NOTE — Assessment & Plan Note (Signed)
Continue statin. 

## 2012-11-29 NOTE — Assessment & Plan Note (Signed)
Followed by electrophysiology. 

## 2012-11-30 ENCOUNTER — Other Ambulatory Visit: Payer: Self-pay | Admitting: *Deleted

## 2012-11-30 DIAGNOSIS — I359 Nonrheumatic aortic valve disorder, unspecified: Secondary | ICD-10-CM

## 2012-11-30 LAB — CBC WITH DIFFERENTIAL/PLATELET
Basophils Absolute: 0 K/uL (ref 0.0–0.1)
Basophils Relative: 0.2 % (ref 0.0–3.0)
Eosinophils Absolute: 0.1 K/uL (ref 0.0–0.7)
Eosinophils Relative: 1.3 % (ref 0.0–5.0)
HCT: 38.9 % (ref 36.0–46.0)
Hemoglobin: 13.2 g/dL (ref 12.0–15.0)
Lymphocytes Relative: 27.1 % (ref 12.0–46.0)
Lymphs Abs: 1.9 K/uL (ref 0.7–4.0)
MCHC: 33.8 g/dL (ref 30.0–36.0)
MCV: 91 fl (ref 78.0–100.0)
Monocytes Absolute: 0.4 K/uL (ref 0.1–1.0)
Monocytes Relative: 5.7 % (ref 3.0–12.0)
Neutro Abs: 4.7 K/uL (ref 1.4–7.7)
Neutrophils Relative %: 65.7 % (ref 43.0–77.0)
Platelets: 169 K/uL (ref 150.0–400.0)
RBC: 4.28 Mil/uL (ref 3.87–5.11)
RDW: 13.8 % (ref 11.5–14.6)
WBC: 7.2 K/uL (ref 4.5–10.5)

## 2012-12-20 ENCOUNTER — Other Ambulatory Visit: Payer: Self-pay | Admitting: Internal Medicine

## 2012-12-21 NOTE — Telephone Encounter (Signed)
Med filled for only #30. Letter mailed to pt stating she needs a follow up appt for cholesterol and labs.

## 2012-12-22 ENCOUNTER — Other Ambulatory Visit: Payer: Self-pay | Admitting: *Deleted

## 2012-12-22 ENCOUNTER — Other Ambulatory Visit: Payer: Self-pay | Admitting: Internal Medicine

## 2012-12-22 NOTE — Telephone Encounter (Signed)
Pt needs appt to receive more refills.

## 2013-01-03 ENCOUNTER — Ambulatory Visit: Payer: BC Managed Care – PPO | Admitting: Cardiology

## 2013-01-20 ENCOUNTER — Other Ambulatory Visit: Payer: Self-pay | Admitting: Family Medicine

## 2013-01-22 ENCOUNTER — Other Ambulatory Visit: Payer: Self-pay | Admitting: *Deleted

## 2013-01-22 MED ORDER — ATORVASTATIN CALCIUM 40 MG PO TABS: ORAL_TABLET | ORAL | Status: DC

## 2013-01-22 NOTE — Telephone Encounter (Signed)
Atorvastatin refill sent to pharmacy. OV due

## 2013-02-01 ENCOUNTER — Other Ambulatory Visit: Payer: Self-pay

## 2013-02-19 ENCOUNTER — Other Ambulatory Visit: Payer: Self-pay | Admitting: Internal Medicine

## 2013-02-20 NOTE — Telephone Encounter (Signed)
Lipitor refilled per protocol. OV due

## 2013-03-07 ENCOUNTER — Encounter: Payer: Self-pay | Admitting: Internal Medicine

## 2013-03-07 ENCOUNTER — Ambulatory Visit (INDEPENDENT_AMBULATORY_CARE_PROVIDER_SITE_OTHER): Payer: Medicare Other | Admitting: Internal Medicine

## 2013-03-07 VITALS — BP 111/70 | HR 75 | Ht 63.0 in | Wt 133.8 lb

## 2013-03-07 DIAGNOSIS — R55 Syncope and collapse: Secondary | ICD-10-CM

## 2013-03-07 DIAGNOSIS — Z950 Presence of cardiac pacemaker: Secondary | ICD-10-CM

## 2013-03-07 DIAGNOSIS — I4430 Unspecified atrioventricular block: Secondary | ICD-10-CM

## 2013-03-07 LAB — MDC_IDC_ENUM_SESS_TYPE_INCLINIC
Brady Statistic AS VS Percent: 0 %
Date Time Interrogation Session: 20141210103827
Lead Channel Impedance Value: 647 Ohm
Lead Channel Pacing Threshold Pulse Width: 0.4 ms
Lead Channel Pacing Threshold Pulse Width: 0.4 ms
Lead Channel Setting Sensing Sensitivity: 4 mV

## 2013-03-07 NOTE — Progress Notes (Signed)
PCP: Marga Melnick, MD Primary Cardiologist:  Dr Epifania Gore Angel Waller is a 67 y.o. female who presents today for routine electrophysiology followup.  Since her last visit to our clinic, the patient reports doing very well.  Today, she denies symptoms of palpitations, chest pain, shortness of breath,  lower extremity edema, dizziness, presyncope, or syncope.  The patient is otherwise without complaint today.   Past Medical History  Diagnosis Date  . Seasonal allergies   . Hyperlipemia   . S/P AVR (aortic valve replacement)     July 2010  . Migraines     "occasionally" (02/29/2012)  . Arthritis     "right knee" (02/29/2012)  . Complete heart block    Past Surgical History  Procedure Laterality Date  . Colonoscopy  2005    negative; [place], [place]  . Tonsillectomy and adenoidectomy  1962  . Cardiac valve replacement  2010    tissue aortic valve; [facility]  . Permanent pacemaker insertion  03/02/2012    Medtronic Adapta L implanted by Dr Johney Frame    Current Outpatient Prescriptions  Medication Sig Dispense Refill  . amoxicillin (AMOXIL) 500 MG capsule as needed.      Marland Kitchen aspirin 81 MG tablet Take 81 mg by mouth daily.      Marland Kitchen atorvastatin (LIPITOR) 40 MG tablet TAKE ONE TABLET BY MOUTH ONE TIME DAILY  30 tablet  0  . Cholecalciferol (VITAMIN D3) 2000 UNITS TABS Take by mouth daily.      Marland Kitchen estradiol (ESTRACE) 2 MG tablet Take 2 mg by mouth daily.      . norethindrone (AYGESTIN) 5 MG tablet Take 5 mg by mouth. Once Every 4 months for 10 days      . OVER THE COUNTER MEDICATION Take 2 tablets by mouth daily. Fish oil      . tretinoin microspheres (RETIN-A MICRO) 0.1 % gel        No current facility-administered medications for this visit.    Physical Exam: Filed Vitals:   03/07/13 1024  BP: 111/70  Pulse: 75  Height: 5\' 3"  (1.6 m)  Weight: 133 lb 12.8 oz (60.691 kg)    GEN- The patient is well appearing, alert and oriented x 3 today.   Head- normocephalic, atraumatic Eyes-   Sclera clear, conjunctiva pink Ears- hearing intact Oropharynx- clear Lungs- Clear to ausculation bilaterally, normal work of breathing Chest- pacemaker pocket is well healed Heart- Regular rate and rhythm,3/6 SEM LUSB with radiation to the carotids, 2/3 diastolic murmur LUSB GI- soft, NT, ND, + BS Extremities- no clubbing, cyanosis, or edema  Pacemaker interrogation- reviewed in detail today,  See PACEART report  Assessment and Plan:  1. Complete heart block Normal pacemaker function See Pace Art report No changes today  2. Syncope Resolved s/p PPM  [facility] Return to the device clinic in 12 months

## 2013-03-07 NOTE — Patient Instructions (Signed)
Your physician wants you to follow-up in: 12 months with Dr Jacquiline Doe will receive a reminder letter in the mail two months in advance. If you don't receive a letter, please call our office to schedule the follow-up appointment.   Remote monitoring is used to monitor your Pacemaker or ICD from home. This monitoring reduces the number of office visits required to check your device to one time per year. It allows [place] to keep an eye on the functioning of your device to ensure it is working properly. You are scheduled for a device check from home on 06/08/13. You may send your transmission at any time that day. If you have a wireless device, the transmission will be sent automatically. After your physician reviews your transmission, you will receive a postcard with your next transmission date.

## 2013-03-24 ENCOUNTER — Other Ambulatory Visit: Payer: Self-pay | Admitting: Internal Medicine

## 2013-03-26 NOTE — Telephone Encounter (Signed)
Lipitor refilled per protocol. JG//CMA 

## 2013-04-24 ENCOUNTER — Other Ambulatory Visit: Payer: Self-pay | Admitting: Internal Medicine

## 2013-04-24 NOTE — Telephone Encounter (Signed)
Lipitor refilled per protocol. JG//CMA

## 2013-05-21 ENCOUNTER — Encounter: Payer: Medicare Other | Admitting: Internal Medicine

## 2013-06-08 ENCOUNTER — Encounter: Payer: Self-pay | Admitting: Internal Medicine

## 2013-06-08 ENCOUNTER — Ambulatory Visit (INDEPENDENT_AMBULATORY_CARE_PROVIDER_SITE_OTHER): Payer: 59 | Admitting: *Deleted

## 2013-06-08 DIAGNOSIS — I4430 Unspecified atrioventricular block: Secondary | ICD-10-CM

## 2013-06-08 DIAGNOSIS — Z950 Presence of cardiac pacemaker: Secondary | ICD-10-CM

## 2013-06-12 ENCOUNTER — Telehealth: Payer: Self-pay

## 2013-06-12 NOTE — Telephone Encounter (Signed)
Medication and allergies:  Reviewed and updated  90 day supply/mail order: n/a Local pharmacy:   [facility] [id number] - [place], [place] - [address] AT [address]   Immunizations due:  Shingles-Due   A/P: No changes to personal, family history or past surgical hx CCS- DUE, had one 10 years ago per patient MMG- Scheduled for 07/17/13 BD- 08/10/11-osteopenia <[facility]> Flu- 12/2012 per patient Tdap 02/29/12 PNA- 03/03/12 Shingles- DUE  To Discuss with Provider: Chest/nasal congestion

## 2013-06-12 NOTE — Telephone Encounter (Signed)
Left message for call back Non-identifiable   Pap- CCS- MMG- BD- 08/10/11-osteopenia <[facility]> Flu- Td- 02/29/12 PNA- 03/03/12 Z-

## 2013-06-13 ENCOUNTER — Encounter: Payer: Self-pay | Admitting: Family Medicine

## 2013-06-13 ENCOUNTER — Ambulatory Visit (INDEPENDENT_AMBULATORY_CARE_PROVIDER_SITE_OTHER): Payer: 59 | Admitting: Family Medicine

## 2013-06-13 VITALS — BP 132/80 | HR 75 | Temp 98.2°F | Resp 16 | Ht 62.5 in | Wt 128.4 lb

## 2013-06-13 DIAGNOSIS — Z954 Presence of other heart-valve replacement: Secondary | ICD-10-CM

## 2013-06-13 DIAGNOSIS — Z0000 Encounter for general adult medical examination without abnormal findings: Secondary | ICD-10-CM

## 2013-06-13 DIAGNOSIS — Z1211 Encounter for screening for malignant neoplasm of colon: Secondary | ICD-10-CM

## 2013-06-13 DIAGNOSIS — R059 Cough, unspecified: Secondary | ICD-10-CM

## 2013-06-13 DIAGNOSIS — R05 Cough: Secondary | ICD-10-CM

## 2013-06-13 DIAGNOSIS — E785 Hyperlipidemia, unspecified: Secondary | ICD-10-CM

## 2013-06-13 LAB — BASIC METABOLIC PANEL
BUN: 20 mg/dL (ref 6–23)
CALCIUM: 9.1 mg/dL (ref 8.4–10.5)
CO2: 26 mEq/L (ref 19–32)
Chloride: 103 mEq/L (ref 96–112)
Creatinine, Ser: 0.8 mg/dL (ref 0.4–1.2)
GFR: 77.98 mL/min (ref >60.00)
GLUCOSE: 87 mg/dL (ref 70–99)
Potassium: 3.8 mEq/L (ref 3.5–5.1)
SODIUM: 136 meq/L (ref 135–145)

## 2013-06-13 LAB — CBC WITH DIFFERENTIAL/PLATELET
BASOS PCT: 0.4 % (ref 0.0–3.0)
Basophils Absolute: 0 10*3/uL (ref 0.0–0.1)
EOS ABS: 0.2 10*3/uL (ref 0.0–0.7)
Eosinophils Relative: 3.2 % (ref 0.0–5.0)
HCT: 39 % (ref 36.0–46.0)
HEMOGLOBIN: 13.2 g/dL (ref 12.0–15.0)
Lymphocytes Relative: 29.1 % (ref 12.0–46.0)
Lymphs Abs: 1.9 10*3/uL (ref 0.7–4.0)
MCHC: 33.8 g/dL (ref 30.0–36.0)
MCV: 92.4 fl (ref 78.0–100.0)
MONO ABS: 0.3 10*3/uL (ref 0.1–1.0)
Monocytes Relative: 4.8 % (ref 3.0–12.0)
NEUTROS ABS: 4.1 10*3/uL (ref 1.4–7.7)
Neutrophils Relative %: 62.5 % (ref 43.0–77.0)
Platelets: 200 10*3/uL (ref 150.0–400.0)
RBC: 4.22 Mil/uL (ref 3.87–5.11)
RDW: 13.6 % (ref 11.5–14.6)
WBC: 6.5 10*3/uL (ref 4.5–10.5)

## 2013-06-13 LAB — HEPATIC FUNCTION PANEL
ALT: 15 U/L (ref 0–35)
AST: 28 U/L (ref 0–37)
Albumin: 3.8 g/dL (ref 3.5–5.2)
Alkaline Phosphatase: 53 U/L (ref 39–117)
BILIRUBIN DIRECT: 0 mg/dL (ref 0.0–0.3)
BILIRUBIN TOTAL: 0.7 mg/dL (ref 0.3–1.2)
Total Protein: 7.2 g/dL (ref 6.0–8.3)

## 2013-06-13 LAB — TSH: TSH: 1.51 u[IU]/mL (ref 0.35–5.50)

## 2013-06-13 LAB — LIPID PANEL
Cholesterol: 165 mg/dL (ref 0–200)
HDL: 31.5 mg/dL — AB (ref >39.00)
LDL CALC: 113 mg/dL — AB (ref 0–99)
Total CHOL/HDL Ratio: 5
Triglycerides: 104 mg/dL (ref 0.0–149.0)
VLDL: 20.8 mg/dL (ref 0.0–40.0)

## 2013-06-13 MED ORDER — PROMETHAZINE-DM 6.25-15 MG/5ML PO SYRP: 5.0000 mL | ORAL_SOLUTION | Freq: Four times a day (QID) | ORAL | Status: DC | PRN

## 2013-06-13 NOTE — Assessment & Plan Note (Signed)
Chronic problem.  Tolerating statin w/o difficulty.  Check labs.  Adjust meds prn  

## 2013-06-13 NOTE — Patient Instructions (Signed)
Follow up in 6 months to recheck cholesterol We'll notify you of your lab results and make any changes if needed Keep up the good work!  You look great! We'll call you with your GI appt for the colonoscopy consultation Start the cough syrup as needed for nights/weekends- will cause drowsiness Mucinex DM for daytime cough Call and ask your insurance company about the shingles shot in the office vs at the pharmacy Call with any questions or concerns Welcome!  We're glad to have you!

## 2013-06-13 NOTE — Assessment & Plan Note (Signed)
Pt's PE WNL.  UTD on health maintenance w/ exception of colonoscopy- referral placed.  Check labs.  Anticipatory guidance provided.

## 2013-06-13 NOTE — Progress Notes (Signed)
   Subjective:    Patient ID: Angel Waller, female    DOB: 02/27/1946, 68 y.o.   MRN: [id number]  HPI Here today for CPE.  Risk Factors: Hyperlipidemia- chronic problem, on Lipitor.  No abd pain, N/V, myalgias. Aortic Valve replacement and pacemaker- following w/ Dr Stanford Breed and Dr Rayann Heman.  Now has leak in aortic valve and is back w/ Dr Michaelle Birks at [facility].  No CP, SOB. URI- sxs started ~1 week ago.  No fever.  Deep, hacking cough.  No facial pain/pressure.  Mild HA.  No ear pain.  + sick contacts. Physical Activity: exercising regularly Fall Risk: low Depression: no current sxs Hearing: normal to conversational tones and whispered voice ADL's: independent Cognitive: normal linear thought process, memory and attention intact Home Safety: safe at home, lives w/ husband Height, Weight, BMI, Visual Acuity: see vitals, vision corrected to 20/20 w/ glasses Counseling: UTD on mammo, DEXA, PAP ([place] at [facility]) due for colonoscopy (last done 2005)- interested in going to [facility] rather than [facility] Ordered: See A&P Care Plan: See A&P   Review of Systems Patient reports no vision/ hearing changes, adenopathy,fever, weight change,  persistant/recurrent hoarseness , swallowing issues, chest pain, palpitations, edema, persistant/recurrent cough, hemoptysis, dyspnea (rest/exertional/paroxysmal nocturnal), gastrointestinal bleeding (melena, rectal bleeding), abdominal pain, significant heartburn, bowel changes, GU symptoms (dysuria, hematuria, incontinence), Gyn symptoms (abnormal  bleeding, pain),  syncope, focal weakness, memory loss, numbness & tingling, skin/hair/nail changes, abnormal bruising or bleeding, anxiety, or depression.     Objective:   Physical Exam General Appearance:    Alert, cooperative, no distress, appears stated age  Head:    Normocephalic, without obvious abnormality, atraumatic  Eyes:    PERRL, conjunctiva/corneas clear, EOM's intact, fundi    benign, both eyes  Ears:     Normal TM's and external ear canals, both ears  Nose:   Nares normal, septum midline, mucosa normal, no drainage    or sinus tenderness  Throat:   Lips, mucosa, and tongue normal; teeth and gums normal  Neck:   Supple, symmetrical, trachea midline, no adenopathy;    Thyroid: no enlargement/tenderness/nodules  Back:     Symmetric, no curvature, ROM normal, no CVA tenderness  Lungs:     Clear to auscultation bilaterally, respirations unlabored  Chest Wall:    No tenderness or deformity   Heart:    Regular rate and rhythm, S1 and S2 normal, II/VI SEM at RUSB, artificial valve click heard  Breast Exam:    Deferred to GYN  Abdomen:     Soft, non-tender, bowel sounds active all four quadrants,    no masses, no organomegaly  Genitalia:    Deferred to GYN  Rectal:    Extremities:   Extremities normal, atraumatic, no cyanosis or edema  Pulses:   2+ and symmetric all extremities  Skin:   Skin color, texture, turgor normal, no rashes or lesions  Lymph nodes:   Cervical, supraclavicular, and axillary nodes normal  Neurologic:   CNII-XII intact, normal strength, sensation and reflexes    throughout          Assessment & Plan:

## 2013-06-13 NOTE — Assessment & Plan Note (Signed)
Chronic problem.  Currently valve is leaking and is back to seeing Dr Michaelle Birks.  Will follow along.

## 2013-06-13 NOTE — Assessment & Plan Note (Signed)
New.  Suspect this is due to resolving viral URI.  No evidence of bacterial infxn on PE.  Cough meds prn.  Reviewed supportive care and red flags that should prompt return.  Pt expressed understanding and is in agreement w/ plan.

## 2013-06-13 NOTE — Progress Notes (Signed)
Pre visit review using our clinic review tool, if applicable. No additional management support is needed unless otherwise documented below in the visit note. 

## 2013-06-14 ENCOUNTER — Telehealth: Payer: Self-pay | Admitting: Internal Medicine

## 2013-06-14 NOTE — Telephone Encounter (Signed)
New message     Pt did do remote 06-08-13 device check.  Mychart said she did not do it

## 2013-06-18 ENCOUNTER — Encounter: Payer: Self-pay | Admitting: Gastroenterology

## 2013-06-21 LAB — MDC_IDC_ENUM_SESS_TYPE_REMOTE
Battery Remaining Longevity: 135 mo
Brady Statistic AP VS Percent: 0 %
Brady Statistic AS VS Percent: 1 %
Date Time Interrogation Session: 20150313113755
Lead Channel Impedance Value: 375 Ohm
Lead Channel Impedance Value: 647 Ohm
Lead Channel Pacing Threshold Amplitude: 0.375 V
Lead Channel Pacing Threshold Amplitude: 0.5 V
Lead Channel Pacing Threshold Pulse Width: 0.4 ms
Lead Channel Pacing Threshold Pulse Width: 0.4 ms
MDC IDC MSMT BATTERY IMPEDANCE: 110 Ohm
MDC IDC MSMT BATTERY VOLTAGE: 2.8 V
MDC IDC MSMT LEADCHNL RA SENSING INTR AMPL: 1 mV
MDC IDC SET LEADCHNL RA PACING AMPLITUDE: 2 V
MDC IDC SET LEADCHNL RV PACING AMPLITUDE: 2.5 V
MDC IDC SET LEADCHNL RV PACING PULSEWIDTH: 0.4 ms
MDC IDC SET LEADCHNL RV SENSING SENSITIVITY: 2.8 mV
MDC IDC STAT BRADY AP VP PERCENT: 18 %
MDC IDC STAT BRADY AS VP PERCENT: 81 %

## 2013-07-17 LAB — HM PAP SMEAR: HM Pap smear: NORMAL

## 2013-07-30 ENCOUNTER — Other Ambulatory Visit: Payer: Self-pay | Admitting: Internal Medicine

## 2013-08-03 ENCOUNTER — Ambulatory Visit (AMBULATORY_SURGERY_CENTER): Payer: Self-pay

## 2013-08-03 VITALS — Ht 63.0 in | Wt 132.6 lb

## 2013-08-03 DIAGNOSIS — Z1211 Encounter for screening for malignant neoplasm of colon: Secondary | ICD-10-CM

## 2013-08-03 MED ORDER — SUPREP BOWEL PREP KIT 17.5-3.13-1.6 GM/177ML PO SOLN: 1.0000 | Freq: Once | ORAL | Status: DC

## 2013-08-03 NOTE — Progress Notes (Signed)
No allergies to eggs or soy No past problems with anesthesia No home oxygen No diet/weight loss meds  Has email  Emmi instructions given for colonoscopy 

## 2013-08-14 ENCOUNTER — Encounter: Payer: Self-pay | Admitting: Gastroenterology

## 2013-08-17 ENCOUNTER — Encounter: Payer: Self-pay | Admitting: Gastroenterology

## 2013-08-17 ENCOUNTER — Ambulatory Visit (AMBULATORY_SURGERY_CENTER): Payer: Medicare Other | Admitting: Gastroenterology

## 2013-08-17 VITALS — BP 111/53 | HR 60 | Temp 97.8°F | Resp 23 | Ht 63.0 in | Wt 132.0 lb

## 2013-08-17 DIAGNOSIS — K648 Other hemorrhoids: Secondary | ICD-10-CM

## 2013-08-17 DIAGNOSIS — D126 Benign neoplasm of colon, unspecified: Secondary | ICD-10-CM

## 2013-08-17 DIAGNOSIS — Z1211 Encounter for screening for malignant neoplasm of colon: Secondary | ICD-10-CM

## 2013-08-17 MED ORDER — SODIUM CHLORIDE 0.9 % IV SOLN: 500.0000 mL | INTRAVENOUS | Status: DC

## 2013-08-17 NOTE — Op Note (Signed)
[place]  [address] [place], [id number]   COLONOSCOPY PROCEDURE REPORT  PATIENT: [name], [name]  MR#: [id number] BIRTHDATE: [birth year] , 79  yrs. old GENDER: Female ENDOSCOPIST: Inda Castle, MD REFERRED FG:[id number] Birdie Riddle, M.D. PROCEDURE DATE:  08/17/2013 PROCEDURE:   Colonoscopy with snare polypectomy and Colonoscopy with cold biopsy polypectomy First Screening Colonoscopy - Avg.  risk and is 50 yrs.  old or older - No.  Prior Negative Screening - Now for repeat screening. 10 or more years since last screening  History of Adenoma - Now for follow-up colonoscopy & has been > or = to 3 yrs.  N/A  Polyps Removed Today? Yes. ASA CLASS:   Class II INDICATIONS:Average risk patient for colon cancer. MEDICATIONS: MAC sedation, administered by CRNA and propofol (Diprivan) 200mg  IV  DESCRIPTION OF PROCEDURE:   After the risks benefits and alternatives of the procedure were thoroughly explained, informed consent was obtained.  A digital rectal exam revealed no abnormalities of the rectum.   The [id number]  endoscope was introduced through the anus and advanced to the cecum, which was identified by both the appendix and ileocecal valve. No adverse events experienced.   The quality of the prep was Suprep good  The instrument was then slowly withdrawn as the colon was fully examined.      COLON FINDINGS: A sessile polyp measuring 3 mm in size was found at the cecum.  A polypectomy was performed with a cold snare.  The resection was complete and the polyp tissue was completely retrieved.   A sessile polyp measuring 2 mm in size was found at the hepatic flexure.  A polypectomy was performed with cold forceps.   A sessile polyp measuring 4 mm in size was found at the splenic flexure.  A polypectomy was performed with a cold snare. The resection was complete and the polyp tissue was completely retrieved.   A flat polyp measuring 3 mm in size was  found in the sigmoid colon.  A polypectomy was performed with a cold snare.  The resection was complete and the polyp tissue was completely retrieved.   Internal hemorrhoids were found.  Retroflexed views revealed no abnormalities. The time to cecum=6 minutes 11 seconds. Withdrawal time=16 minutes 19 seconds.  The scope was withdrawn and the procedure completed. COMPLICATIONS: There were no complications.  ENDOSCOPIC IMPRESSION: 1.   Sessile polyp measuring 3 mm in size was found at the cecum; polypectomy was performed with a cold snare 2.   Sessile polyp measuring 2 mm in size was found at the hepatic flexure; polypectomy was performed with cold forceps 3.   Sessile polyp measuring 4 mm in size was found at the splenic flexure; polypectomy was performed with a cold snare 4.   Flat polyp measuring 3 mm in size was found in the sigmoid colon; polypectomy was performed with a cold snare 5.   Internal hemorrhoids  RECOMMENDATIONS: If the polyp(s) removed today are proven to be adenomatous (pre-cancerous) polyps, you will need a colonoscopy in 3 years. Otherwise you should continue to follow colorectal cancer screening guidelines for "routine risk" patients with a colonoscopy in 10 years.  You will receive a letter within 1-2 weeks with the results of your biopsy as well as final recommendations.  Please call my office if you have not received a letter after 3 weeks.   eSigned:  Inda Castle, MD 08/17/2013 8:37 AM   cc:   PATIENT NAME:  Angel Waller,  Angel Waller MR#: [id number]

## 2013-08-17 NOTE — Patient Instructions (Signed)
YOU HAD AN ENDOSCOPIC PROCEDURE TODAY AT [facility]: Refer to the procedure report that was given to you for any specific questions about what was found during the examination.  If the procedure report does not answer your questions, please call your gastroenterologist to clarify.  If you requested that your care partner not be given the details of your procedure findings, then the procedure report has been included in a sealed envelope for you to review at your convenience later.  YOU SHOULD EXPECT: Some feelings of bloating in the abdomen. Passage of more gas than usual.  Walking can help get rid of the air that was put into your GI tract during the procedure and reduce the bloating. If you had a lower endoscopy (such as a colonoscopy or flexible sigmoidoscopy) you may notice spotting of blood in your stool or on the toilet paper. If you underwent a bowel prep for your procedure, then you may not have a normal bowel movement for a few days.  DIET: Your first meal following the procedure should be a light meal and then it is ok to progress to your normal diet.  A half-sandwich or bowl of soup is an example of a good first meal.  Heavy or fried foods are harder to digest and may make you feel nauseous or bloated.  Likewise meals heavy in dairy and vegetables can cause extra gas to form and this can also increase the bloating.  Drink plenty of fluids but you should avoid alcoholic beverages for 24 hours.Try to eat a high fiber diet.  That will help your hemorrhoids.  ACTIVITY: Your care partner should take you home directly after the procedure.  You should plan to take it easy, moving slowly for the rest of the day.  You can resume normal activity the day after the procedure however you should NOT DRIVE or use heavy machinery for 24 hours (because of the sedation medicines used during the test).    SYMPTOMS TO REPORT IMMEDIATELY: A gastroenterologist can be reached at any hour.  During  normal business hours, 8:30 AM to 5:00 PM Monday through Friday, call [phone].  After hours and on weekends, please call the GI answering service at [phone] who will take a message and have the physician on call contact you.   Following lower endoscopy (colonoscopy or flexible sigmoidoscopy):  Excessive amounts of blood in the stool  Significant tenderness or worsening of abdominal pains  Swelling of the abdomen that is new, acute  Fever of 100F or higher  FOLLOW UP: If any biopsies were taken you will be contacted by phone or by letter within the next 1-3 weeks.  Call your gastroenterologist if you have not heard about the biopsies in 3 weeks.  Our staff will call the home number listed on your records the next business day following your procedure to check on you and address any questions or concerns that you may have at that time regarding the information given to you following your procedure. This is a courtesy call and so if there is no answer at the home number and we have not heard from you through the emergency physician on call, we will assume that you have returned to your regular daily activities without incident.  SIGNATURES/CONFIDENTIALITY: You and/or your care partner have signed paperwork which will be entered into your electronic medical record.  These signatures attest to the fact that that the information above on your After Visit Summary has been  reviewed and is understood.  Full responsibility of the confidentiality of this discharge information lies with you and/or your care-partner.  You will need another colonoscopy in 3 years.

## 2013-08-17 NOTE — Progress Notes (Signed)
A/ox3 pleased with MAC, report to [facility]

## 2013-08-17 NOTE — Progress Notes (Signed)
Called to room to assist during endoscopic procedure.  Patient ID and intended procedure confirmed with present staff. Received instructions for my participation in the procedure from the performing physician.  

## 2013-08-21 ENCOUNTER — Telehealth: Payer: Self-pay | Admitting: *Deleted

## 2013-08-21 NOTE — Telephone Encounter (Signed)
  Follow up Call-  Call back number 08/17/2013  Post procedure Call Back phone  # [phone] or [phone]  Permission to leave phone message Yes     Patient questions:  Message left to call us if necessary.

## 2013-08-24 ENCOUNTER — Encounter: Payer: Self-pay | Admitting: Gastroenterology

## 2013-08-28 ENCOUNTER — Other Ambulatory Visit: Payer: Self-pay | Admitting: Internal Medicine

## 2013-08-30 ENCOUNTER — Other Ambulatory Visit: Payer: Self-pay | Admitting: Family Medicine

## 2013-08-31 NOTE — Telephone Encounter (Signed)
Med filled.  

## 2013-09-11 ENCOUNTER — Ambulatory Visit (INDEPENDENT_AMBULATORY_CARE_PROVIDER_SITE_OTHER): Payer: Medicare Other | Admitting: *Deleted

## 2013-09-11 DIAGNOSIS — I4430 Unspecified atrioventricular block: Secondary | ICD-10-CM

## 2013-09-11 DIAGNOSIS — Z950 Presence of cardiac pacemaker: Secondary | ICD-10-CM

## 2013-09-11 LAB — MDC_IDC_ENUM_SESS_TYPE_REMOTE
Battery Remaining Longevity: 121 mo
Brady Statistic AP VS Percent: 0.1 %
Brady Statistic AS VP Percent: 79.3 %
Brady Statistic AS VS Percent: 0.3 %
Lead Channel Impedance Value: 370 Ohm
Lead Channel Pacing Threshold Amplitude: 0.5 V
Lead Channel Pacing Threshold Pulse Width: 0.4 ms
Lead Channel Pacing Threshold Pulse Width: 0.4 ms
Lead Channel Sensing Intrinsic Amplitude: 1 mV
Lead Channel Setting Pacing Amplitude: 2.5 V
Lead Channel Setting Pacing Pulse Width: 0.4 ms
MDC IDC MSMT LEADCHNL RV IMPEDANCE VALUE: 643 Ohm
MDC IDC MSMT LEADCHNL RV PACING THRESHOLD AMPLITUDE: 0.375 V
MDC IDC SET LEADCHNL RA PACING AMPLITUDE: 2 V
MDC IDC SET LEADCHNL RV SENSING SENSITIVITY: 2.8 mV
MDC IDC STAT BRADY AP VP PERCENT: 20.3 %

## 2013-09-11 NOTE — Progress Notes (Signed)
Remote pacemaker transmission.   

## 2013-09-25 ENCOUNTER — Encounter: Payer: Self-pay | Admitting: Cardiology

## 2013-11-08 NOTE — Telephone Encounter (Signed)
Pt has had remote transmission since phone note taking.

## 2013-11-28 ENCOUNTER — Encounter: Payer: Self-pay | Admitting: Internal Medicine

## 2013-12-04 ENCOUNTER — Encounter: Payer: Self-pay | Admitting: Internal Medicine

## 2013-12-04 ENCOUNTER — Ambulatory Visit (INDEPENDENT_AMBULATORY_CARE_PROVIDER_SITE_OTHER): Payer: 59 | Admitting: Internal Medicine

## 2013-12-04 VITALS — BP 124/62 | HR 76 | Temp 97.9°F | Wt 135.1 lb

## 2013-12-04 DIAGNOSIS — H6122 Impacted cerumen, left ear: Secondary | ICD-10-CM

## 2013-12-04 DIAGNOSIS — H6120 Impacted cerumen, unspecified ear: Secondary | ICD-10-CM

## 2013-12-04 NOTE — Progress Notes (Signed)
Subjective:    Patient ID: Angel Waller, female    DOB: 1945/05/18, 68 y.o.   MRN: [id number]  DOS:  12/04/2013 Type of visit - description : acute Interval history: Few days history of left ear pressure and decreased hearing. She has a history of cerumen impaction, has been using H2 O2 and mineral oil without help you   ROS Denies fever, chills or URI type of symptoms. no discharge or bleeding  Past Medical History  Diagnosis Date  . Seasonal allergies   . Hyperlipemia   . S/P AVR (aortic valve replacement)     July 2010  . Migraines     "occasionally" (02/29/2012)  . Arthritis     "right knee" (02/29/2012)  . Complete heart block     Past Surgical History  Procedure Laterality Date  . Colonoscopy  2005    negative; [place], [place]  . Tonsillectomy and adenoidectomy  1962  . Cardiac valve replacement  2010    tissue aortic valve; [facility]  . Permanent pacemaker insertion  03/02/2012    Medtronic Adapta L implanted by Dr Rayann Heman  . Knee arthroscopy Right     History   Social History  . Marital Status: Married    Spouse Name: N/A    Number of Children: N/A  . Years of Education: N/A   Occupational History  . Special Education Teacher    Social History Main Topics  . Smoking status: Former Smoker -- 0.50 packs/day for 42 years    Types: Cigarettes    Quit date: 03/30/2007  . Smokeless tobacco: Never Used     Comment: smoked ages [age], up to 1 pp WEEK  . Alcohol Use: No  . Drug Use: No  . Sexual Activity: Yes   Other Topics Concern  . Not on file   Social History Narrative  . No narrative on file        Medication List       This list is accurate as of: 12/04/13  5:55 PM.  Always use your most recent med list.               amoxicillin 500 MG capsule  Commonly known as:  AMOXIL  as needed.     aspirin 81 MG tablet  Take 81 mg by mouth daily.     atorvastatin 40 MG tablet  Commonly known as:  LIPITOR  TAKE 1 TABLET BY MOUTH EVERY DAY     Bee Pollen 550 MG Caps  Take 2 tablets by mouth daily.     estradiol 2 MG tablet  Commonly known as:  ESTRACE  Take 2 mg by mouth daily.     norethindrone 5 MG tablet  Commonly known as:  AYGESTIN  Take 5 mg by mouth. Once Every 4 months for 10 days     OVER THE COUNTER MEDICATION  muscadine grape seed     OVER THE COUNTER MEDICATION  Take 2 tablets by mouth daily. Fish oil     SOLUBLE FIBER/PROBIOTICS PO  Take 1 capsule by mouth daily.     tretinoin microspheres 0.1 % gel  Commonly known as:  RETIN-A MICRO     vitamin C 500 MG tablet  Commonly known as:  ASCORBIC ACID  Take 500 mg by mouth daily.     Vitamin D3 2000 UNITS Tabs  Take by mouth daily.           Objective:   Physical Exam BP 124/62  Pulse  76  Temp(Src) 97.9 F (36.6 C) (Oral)  Wt 135 lb 2 oz (61.292 kg)  SpO2 97%  General -- alert, well-developed, NAD.   HEENT--  R ear normal L ear-- + impaction  Neurologic--  alert & oriented X3. Speech normal, gait appropriate for age, strength symmetric and appropriate for age.   Psych-- Cognition and judgment appear intact. Cooperative with normal attention span and concentration. No anxious or depressed appearing.         Assessment & Plan:  Impaction, I was unable to remove the wax w/ a spoon. A lavage was performed, abundant wax removed, after the lavash the tympanic membrane was normal. Recommended patient to use peroxide weekly and return to the office as needed

## 2013-12-04 NOTE — Progress Notes (Signed)
Pre visit review using our clinic review tool, if applicable. No additional management support is needed unless otherwise documented below in the visit note. 

## 2013-12-13 ENCOUNTER — Ambulatory Visit (INDEPENDENT_AMBULATORY_CARE_PROVIDER_SITE_OTHER): Payer: 59 | Admitting: *Deleted

## 2013-12-13 ENCOUNTER — Encounter: Payer: Self-pay | Admitting: Internal Medicine

## 2013-12-13 DIAGNOSIS — I455 Other specified heart block: Secondary | ICD-10-CM

## 2013-12-13 NOTE — Progress Notes (Signed)
Remote pacemaker transmission.   

## 2013-12-14 LAB — MDC_IDC_ENUM_SESS_TYPE_REMOTE
Battery Voltage: 2.79 V
Brady Statistic AP VS Percent: 0 %
Brady Statistic AS VS Percent: 1 %
Date Time Interrogation Session: 20150917102325
Lead Channel Impedance Value: 643 Ohm
Lead Channel Pacing Threshold Amplitude: 0.375 V
Lead Channel Pacing Threshold Amplitude: 0.5 V
Lead Channel Pacing Threshold Pulse Width: 0.4 ms
Lead Channel Pacing Threshold Pulse Width: 0.4 ms
Lead Channel Setting Pacing Pulse Width: 0.4 ms
Lead Channel Setting Sensing Sensitivity: 2.8 mV
MDC IDC MSMT BATTERY IMPEDANCE: 110 Ohm
MDC IDC MSMT BATTERY REMAINING LONGEVITY: 135 mo
MDC IDC MSMT LEADCHNL RA IMPEDANCE VALUE: 380 Ohm
MDC IDC MSMT LEADCHNL RA SENSING INTR AMPL: 1 mV
MDC IDC SET LEADCHNL RA PACING AMPLITUDE: 2 V
MDC IDC SET LEADCHNL RV PACING AMPLITUDE: 2.5 V
MDC IDC STAT BRADY AP VP PERCENT: 20 %
MDC IDC STAT BRADY AS VP PERCENT: 79 %

## 2013-12-18 ENCOUNTER — Encounter: Payer: Self-pay | Admitting: Cardiology

## 2013-12-19 ENCOUNTER — Telehealth: Payer: Self-pay | Admitting: Internal Medicine

## 2013-12-19 NOTE — Telephone Encounter (Signed)
New message          Pt needs surgical clearance for a knee replacement

## 2013-12-19 NOTE — Telephone Encounter (Signed)
Will forward to Fredia Beets, RN for Dr Stanford Breed who is her primary cardiologist.  Device form will be faxed over to [place] and filled out once surgery is scheduled.

## 2013-12-20 ENCOUNTER — Ambulatory Visit: Payer: 59 | Admitting: Medical

## 2013-12-20 NOTE — Telephone Encounter (Signed)
Left msg w/ Orson Slick at [facility]. Left my direct line #.

## 2013-12-20 NOTE — Telephone Encounter (Signed)
Perioperative fax request received, filled out, then faxed back to [place] / Orson Slick.

## 2013-12-20 NOTE — Telephone Encounter (Signed)
Left message for pt to call, patient has not been seen by dr Stanford Breed in one year. She will need appointment to be cleared for surgery.

## 2013-12-20 NOTE — Telephone Encounter (Signed)
Spoke with pt, she has been cleared for surgery by dr Baron Hamper at [facility]. They only need information regarding her pacemaker.

## 2013-12-24 ENCOUNTER — Encounter (HOSPITAL_COMMUNITY): Payer: Self-pay | Admitting: Pharmacy Technician

## 2013-12-26 ENCOUNTER — Encounter (HOSPITAL_COMMUNITY)
Admission: RE | Admit: 2013-12-26 | Discharge: 2013-12-26 | Disposition: A | Discharge status: Home / Self Care | Payer: 59 | Source: Ambulatory Visit | Attending: Orthopedic Surgery | Admitting: Orthopedic Surgery

## 2013-12-26 ENCOUNTER — Encounter (HOSPITAL_COMMUNITY): Payer: Self-pay

## 2013-12-26 DIAGNOSIS — M1710 Unilateral primary osteoarthritis, unspecified knee: Secondary | ICD-10-CM | POA: Insufficient documentation

## 2013-12-26 DIAGNOSIS — Z950 Presence of cardiac pacemaker: Secondary | ICD-10-CM | POA: Diagnosis not present

## 2013-12-26 DIAGNOSIS — Z01810 Encounter for preprocedural cardiovascular examination: Secondary | ICD-10-CM | POA: Insufficient documentation

## 2013-12-26 DIAGNOSIS — Z01812 Encounter for preprocedural laboratory examination: Secondary | ICD-10-CM | POA: Diagnosis not present

## 2013-12-26 HISTORY — DX: Presence of cardiac pacemaker: Z95.0

## 2013-12-26 LAB — CBC
HEMATOCRIT: 39.1 % (ref 36.0–46.0)
HEMOGLOBIN: 12.9 g/dL (ref 12.0–15.0)
MCH: 30.4 pg (ref 26.0–34.0)
MCHC: 33 g/dL (ref 30.0–36.0)
MCV: 92 fL (ref 78.0–100.0)
Platelets: 171 10*3/uL (ref 150–400)
RBC: 4.25 MIL/uL (ref 3.87–5.11)
RDW: 13.1 % (ref 11.5–15.5)
WBC: 7.6 10*3/uL (ref 4.0–10.5)

## 2013-12-26 LAB — BASIC METABOLIC PANEL
Anion gap: 11 (ref 5–15)
BUN: 25 mg/dL — AB (ref 6–23)
CHLORIDE: 105 meq/L (ref 96–112)
CO2: 25 meq/L (ref 19–32)
CREATININE: 0.6 mg/dL (ref 0.50–1.10)
Calcium: 9.2 mg/dL (ref 8.4–10.5)
GFR calc Af Amer: >90 mL/min (ref >90)
GFR calc non Af Amer: >90 mL/min (ref >90)
GLUCOSE: 134 mg/dL — AB (ref 70–99)
Potassium: 4.2 mEq/L (ref 3.7–5.3)
Sodium: 141 mEq/L (ref 137–147)

## 2013-12-26 LAB — URINE MICROSCOPIC-ADD ON

## 2013-12-26 LAB — PROTIME-INR
INR: 1.02 (ref 0.00–1.49)
Prothrombin Time: 13.4 seconds (ref 11.6–15.2)

## 2013-12-26 LAB — URINALYSIS, ROUTINE W REFLEX MICROSCOPIC
BILIRUBIN URINE: NEGATIVE
Glucose, UA: NEGATIVE mg/dL
HGB URINE DIPSTICK: NEGATIVE
Ketones, ur: NEGATIVE mg/dL
Nitrite: NEGATIVE
PH: 5.5 (ref 5.0–8.0)
Protein, ur: NEGATIVE mg/dL
SPECIFIC GRAVITY, URINE: 1.02 (ref 1.005–1.030)
Urobilinogen, UA: 1 mg/dL (ref 0.0–1.0)

## 2013-12-26 LAB — SURGICAL PCR SCREEN
MRSA, PCR: NEGATIVE
Staphylococcus aureus: NEGATIVE

## 2013-12-26 LAB — APTT: aPTT: 25 seconds (ref 24–37)

## 2013-12-26 NOTE — Progress Notes (Signed)
Patient concerned over surgery being at [place] and spoke with her regarding this.  Patient is aware I have voiced her concern regarding time of surgery to office.

## 2013-12-26 NOTE — Progress Notes (Addendum)
Clearance- Dr Rayann Heman on chart 12/13/2013   Clearance- Dr Michaelle Birks on chart 12/19/13  LOV 10/31/2013 - Dr Michaelle Birks on chart  LOV with DR Allred- 03/07/13 [facility]  Last Device Interrogation - 12/14/13 [facility]

## 2013-12-26 NOTE — Patient Instructions (Addendum)
20     Your procedure is scheduled on:   Thursday 01/07/2014    Report to [facility] Main Entrance and follow signs to Short Stay  at  12 noon AM.  Call this number if you have problems the night before or morning of surgery:   [phone]   Remember:          Do not eat food after midnite.  You may have clear liquids until  0830am then npol    Take these medicines the morning of surgery with A SIP OF WATER: NONE    [facility] IS NOT RESPONSIBLE FOR ANY BELONGINGS OR VALUABLES BROUGHT TO HOSPITAL.  Marland Kitchen  Leave suitcase in the car. After surgery it may be brought to your room.  For patients admitted to the hospital, checkout time is 11:00 AM the day of              Discharge.    DO NOT WEAR JEWELRY,MAKE-UP,LOTIONS,POWDERS,PERFUMES,CONTACTS , DENTURES OR BRIDGEWORK ,AND DO NOT WEAR FALSE EYELASHES                                    Patients discharged the day of surgery will not be allowed to drive home.  If going home the same day of surgery, must have someone stay with you first 24 hrs.at home and arrange for someone to drive you home from the [place]: husband    CLEAR LIQUID DIET   Foods Allowed                                                                     Foods Excluded  Coffee and tea, regular and decaf                             liquids that you cannot  Plain Jell-O in any flavor                                             see through such as: Fruit ices (not with fruit pulp)                                     milk, soups, orange juice  Iced Popsicles                                    All solid food Carbonated beverages, regular and diet                                    Cranberry, grape and apple juices Sports drinks like Gatorade Lightly seasoned clear broth or consume(fat free)  Sugar, honey syrup  Sample Menu Breakfast                                Lunch                                     Supper Cranberry  juice                    Beef broth                            Chicken broth Jell-O                                     Grape juice                           Apple juice Coffee or tea                        Jell-O                                      Popsicle                                                Coffee or tea                        Coffee or tea  _____________________________________________________________________     Special Instructions:              Please read over the following fact sheets that you were given:             1. [place] - Preparing for Surgery Before surgery, you can play an important role.  Because skin is not sterile, your skin needs to be as free of germs as possible.  You can reduce the number of germs on your skin by washing with CHG (chlorahexidine gluconate) soap before surgery.  CHG is an antiseptic cleaner which kills germs and bonds with the skin to continue killing germs even after washing. Please DO NOT use if you have an allergy to CHG or antibacterial soaps.  If your skin becomes reddened/irritated stop using the CHG and inform your nurse when you arrive at Short Stay. Do not shave (including legs and underarms) for at least 48 hours prior to the first CHG shower.  You may shave your face/neck. Please follow these instructions carefully:  1.  Shower with CHG Soap  the night before surgery and the  morning of Surgery.  2.  If you choose to wash your hair, wash your hair first as usual with your  normal  shampoo.  3.  After you shampoo, rinse your hair and body thoroughly to remove the  shampoo.                           4.  Use CHG as you would any other liquid soap.  You can apply chg directly  to the skin and wash                       Gently with a scrungie or clean washcloth.  5.  Apply the CHG Soap to your body  ONLY FROM THE NECK DOWN.   Do not use on face/ open                           Wound or open sores. Avoid contact with eyes, ears mouth and genitals (private parts).                       Wash face,  Genitals (private parts) with your normal soap.             6.  Wash thoroughly, paying special attention to the area where your surgery  will be performed.  7.  Thoroughly rinse your body with warm water from the neck down.  8.  DO NOT shower/wash with your normal soap after using and rinsing off  the CHG Soap.                9.  Pat yourself dry with a clean towel.            10.  Wear clean pajamas.            11.  Place clean sheets on your bed the night of your first shower and do not  sleep with pets. Day of Surgery : Do not apply any lotions/deodorants the morning of surgery.  Please wear clean clothes to the hospital/surgery center.  FAILURE TO FOLLOW THESE INSTRUCTIONS MAY RESULT IN THE CANCELLATION OF YOUR SURGERY PATIENT SIGNATURE_________________________________  NURSE SIGNATURE__________________________________  ________________________________________________________________________   Angel Waller  An incentive spirometer is a tool that can help keep your lungs clear and active. This tool measures how well you are filling your lungs with each breath. Taking long deep breaths may help reverse or decrease the chance of developing breathing (pulmonary) problems (especially infection) following:  A long period of time when you are unable to move or be active. BEFORE THE PROCEDURE   If the spirometer includes an indicator to show your best effort, your nurse or respiratory therapist will set it to a desired goal.  If possible, sit up straight or lean slightly forward. Try not to slouch.  Hold the incentive spirometer in an upright position. INSTRUCTIONS FOR USE  1. Sit on the edge of your bed if possible, or sit up as far as you can in bed or on a chair. 2. Hold the  incentive spirometer in an upright position. 3. Breathe out normally. 4. Place the mouthpiece in your mouth and seal your lips tightly around it. 5. Breathe in slowly and as deeply as possible, raising the piston or the ball toward the top of the column. 6. Hold your breath for  3-5 seconds or for as long as possible. Allow the piston or ball to fall to the bottom of the column. 7. Remove the mouthpiece from your mouth and breathe out normally. 8. Rest for a few seconds and repeat Steps 1 through 7 at least 10 times every 1-2 hours when you are awake. Take your time and take a few normal breaths between deep breaths. 9. The spirometer may include an indicator to show your best effort. Use the indicator as a goal to work toward during each repetition. 10. After each set of 10 deep breaths, practice coughing to be sure your lungs are clear. If you have an incision (the cut made at the time of surgery), support your incision when coughing by placing a pillow or rolled up towels firmly against it. Once you are able to get out of bed, walk around indoors and cough well. You may stop using the incentive spirometer when instructed by your caregiver.  RISKS AND COMPLICATIONS  Take your time so you do not get dizzy or light-headed.  If you are in pain, you may need to take or ask for pain medication before doing incentive spirometry. It is harder to take a deep breath if you are having pain. AFTER USE  Rest and breathe slowly and easily.  It can be helpful to keep track of a log of your progress. Your caregiver can provide you with a simple table to help with this. If you are using the spirometer at home, follow these instructions: [place] IF:   You are having difficultly using the spirometer.  You have trouble using the spirometer as often as instructed.  Your pain medication is not giving enough relief while using the spirometer.  You develop fever of 100.5 F (38.1 C) or  higher. SEEK IMMEDIATE MEDICAL CARE IF:   You cough up bloody sputum that had not been present before.  You develop fever of 102 F (38.9 C) or greater.  You develop worsening pain at or near the incision site. MAKE SURE YOU:   Understand these instructions.  Will watch your condition.  Will get help right away if you are not doing well or get worse. Document Released: 07/26/2006 Document Revised: 06/07/2011 Document Reviewed: 09/26/2006 Exit[employer] Patient Information 2014 [place].   ________________________________________________________________________ [facility] - Preparing for Surgery Before surgery, you can play an important role.  Because skin is not sterile, your skin needs to be as free of germs as possible.  You can reduce the number of germs on your skin by washing with CHG (chlorahexidine gluconate) soap before surgery.  CHG is an antiseptic cleaner which kills germs and bonds with the skin to continue killing germs even after washing. Please DO NOT use if you have an allergy to CHG or antibacterial soaps.  If your skin becomes reddened/irritated stop using the CHG and inform your nurse when you arrive at Short Stay. Do not shave (including legs and underarms) for at least 48 hours prior to the first CHG shower.  You may shave your face/neck. Please follow these instructions carefully:  1.  Shower with CHG Soap the night before surgery and the  morning of Surgery.  2.  If you choose to wash your hair, wash your hair first as usual with your  normal  shampoo.  3.  After you shampoo, rinse your hair and body thoroughly to remove the  shampoo.  4.  Use CHG as you would any other liquid soap.  You can apply chg directly  to the skin and wash                       Gently with a scrungie or clean washcloth.  5.  Apply the CHG Soap to your body ONLY FROM THE NECK DOWN.   Do not use on face/ open                           Wound or open sores. Avoid  contact with eyes, ears mouth and genitals (private parts).                       Wash face,  Genitals (private parts) with your normal soap.             6.  Wash thoroughly, paying special attention to the area where your surgery  will be performed.  7.  Thoroughly rinse your body with warm water from the neck down.  8.  DO NOT shower/wash with your normal soap after using and rinsing off  the CHG Soap.                9.  Pat yourself dry with a clean towel.            10.  Wear clean pajamas.            11.  Place clean sheets on your bed the night of your first shower and do not  sleep with pets. Day of Surgery : Do not apply any lotions/deodorants the morning of surgery.  Please wear clean clothes to the hospital/surgery center.  FAILURE TO FOLLOW THESE INSTRUCTIONS MAY RESULT IN THE CANCELLATION OF YOUR SURGERY PATIENT SIGNATURE_________________________________  NURSE SIGNATURE__________________________________  ________________________________________________________________________  WHAT IS A BLOOD TRANSFUSION? Blood Transfusion Information  A transfusion is the replacement of blood or some of its parts. Blood is made up of multiple cells which provide different functions.  Red blood cells carry oxygen and are used for blood loss replacement.  White blood cells fight against infection.  Platelets control bleeding.  Plasma helps clot blood.  Other blood products are available for specialized needs, such as hemophilia or other clotting disorders. BEFORE THE TRANSFUSION  Who gives blood for transfusions?   Healthy volunteers who are fully evaluated to make sure their blood is safe. This is blood bank blood. Transfusion therapy is the safest it has ever been in the practice of medicine. Before blood is taken from a donor, a complete history is taken to make sure that person has no history of diseases nor engages in risky social behavior (examples are intravenous drug use or  sexual activity with multiple partners). The donor's travel history is screened to minimize risk of transmitting infections, such as malaria. The donated blood is tested for signs of infectious diseases, such as HIV and hepatitis. The blood is then tested to be sure it is compatible with you in order to minimize the chance of a transfusion reaction. If you or a relative donates blood, this is often done in anticipation of surgery and is not appropriate for emergency situations. It takes many days to process the donated blood. RISKS AND COMPLICATIONS Although transfusion therapy is very safe and saves many lives, the main dangers of transfusion include:   Getting an infectious disease.  Developing a transfusion reaction.  This is an allergic reaction to something in the blood you were given. Every precaution is taken to prevent this. The decision to have a blood transfusion has been considered carefully by your caregiver before blood is given. Blood is not given unless the benefits outweigh the risks. AFTER THE TRANSFUSION  Right after receiving a blood transfusion, you will usually feel much better and more energetic. This is especially true if your red blood cells have gotten low (anemic). The transfusion raises the level of the red blood cells which carry oxygen, and this usually causes an energy increase.  The nurse administering the transfusion will monitor you carefully for complications. HOME CARE INSTRUCTIONS  No special instructions are needed after a transfusion. You may find your energy is better. Speak with your caregiver about any limitations on activity for underlying diseases you may have. SEEK MEDICAL CARE IF:   Your condition is not improving after your transfusion.  You develop redness or irritation at the intravenous (IV) site. SEEK IMMEDIATE MEDICAL CARE IF:  Any of the following symptoms occur over the next 12 hours:  Shaking chills.  You have a temperature by mouth above  102 F (38.9 C), not controlled by medicine.  Chest, back, or muscle pain.  People around you feel you are not acting correctly or are confused.  Shortness of breath or difficulty breathing.  Dizziness and fainting.  You get a rash or develop hives.  You have a decrease in urine output.  Your urine turns a dark color or changes to pink, red, or brown. Any of the following symptoms occur over the next 10 days:  You have a temperature by mouth above 102 F (38.9 C), not controlled by medicine.  Shortness of breath.  Weakness after normal activity.  The white part of the eye turns yellow (jaundice).  You have a decrease in the amount of urine or are urinating less often.  Your urine turns a dark color or changes to pink, red, or brown. Document Released: 03/12/2000 Document Revised: 06/07/2011 Document Reviewed: 10/30/2007 [facility] Patient Information 2014 [place], [place].  _______________________________________________________________________

## 2013-12-26 NOTE — Progress Notes (Signed)
U/A with micro results faxed via EPIC to Dr Olin.   

## 2013-12-27 NOTE — Progress Notes (Signed)
CT chest 04/18/2013 in Care Everywhere from [facility].

## 2013-12-30 NOTE — H&P (Signed)
TOTAL KNEE ADMISSION H&P  Patient is being admitted for right total knee arthroplasty.  Subjective:  Chief Complaint:    Right knee OA / pain.  HPI: ZYLIAH SCHIER, 68 y.o. female, has a history of pain and functional disability in the right knee due to arthritis and has failed non-surgical conservative treatments for greater than 12 weeks to includeNSAID's and/or analgesics, corticosteriod injections, viscosupplementation injections and activity modification.  Onset of symptoms was gradual, starting 1.5+ years ago with gradually worsening course since that time. The patient noted prior procedures on the knee to include  arthroscopy and menisectomy on the right knee(s).  Patient currently rates pain in the right knee(s) at 7 out of 10 with activity. Patient has worsening of pain with activity and weight bearing, pain that interferes with activities of daily living, pain with passive range of motion, crepitus and joint swelling.  Patient has evidence of periarticular osteophytes and joint space narrowing by imaging studies. There is no active infection.  Risks, benefits and expectations were discussed with the patient.  Risks including but not limited to the risk of anesthesia, blood clots, nerve damage, blood vessel damage, failure of the prosthesis, infection and up to and including death.  Patient understand the risks, benefits and expectations and wishes to proceed with surgery.   PCP: Annye Asa, MD  D/C Plans:      Home with HHPT  Post-op Meds:       No Rx given   Tranexamic Acid:      To be given - topically (CAD)  Decadron:      Is to be given  FYI:     Xarelto post-op then ASA  Norco post-op    Patient Active Problem List   Diagnosis Date Noted  . Cough 06/13/2013  . Routine general medical examination at a health care facility 06/13/2013  . Pacemaker-Medtronic 03/02/2012  . Sinus pause 03/01/2012  . AV block 03/01/2012  . Syncope 02/29/2012  . ALLERGIC RHINITIS,  SEASONAL 02/24/2010  . HYPERLIPIDEMIA 02/19/2009  . AORTIC VALVE REPLACEMENT, HX OF 10/15/2008   Past Medical History  Diagnosis Date  . Seasonal allergies   . Hyperlipemia   . S/P AVR (aortic valve replacement)     July 2010  . Migraines     "occasionally" (02/29/2012)  . Arthritis     "right knee" (02/29/2012)  . Complete heart block   . Pacemaker     Past Surgical History  Procedure Laterality Date  . Colonoscopy  2005    negative; [place], [place]  . Tonsillectomy and adenoidectomy  1962  . Cardiac valve replacement  2010    tissue aortic valve; [facility]  . Permanent pacemaker insertion  03/02/2012    Medtronic Adapta L implanted by Dr Rayann Heman  . Knee arthroscopy Right   . Insert / replace / remove pacemaker      No prescriptions prior to admission   No Known Allergies  History  Substance Use Topics  . Smoking status: Former Smoker -- 0.50 packs/day for 42 years    Types: Cigarettes    Quit date: 03/30/2007  . Smokeless tobacco: Never Used     Comment: smoked ages [age], up to 1 pp WEEK  . Alcohol Use: No    Family History  Problem Relation Age of Onset  . Heart attack Mother 62    CABG  . Heart attack Father 53    aneurysm post MI  . Colon polyps Father   . Heart disease  in both M & P uncles & aunts; no premature MI  . Diabetes Neg Hx   . Stroke Neg Hx   . Colon cancer Neg Hx   . Pancreatic cancer Neg Hx   . Rectal cancer Neg Hx   . Stomach cancer Neg Hx      Review of Systems  Constitutional: Negative.   Eyes: Negative.   Respiratory: Positive for cough.   Cardiovascular: Negative.   Gastrointestinal: Negative.   Genitourinary: Negative.   Musculoskeletal: Positive for joint pain.  Skin: Negative.   Neurological: Positive for headaches.  Endo/Heme/Allergies: Positive for environmental allergies.  Psychiatric/Behavioral: Negative.     Objective:  Physical Exam  Constitutional: She is oriented to person, place, and time. She appears  well-developed and well-nourished.  HENT:  Head: Normocephalic and atraumatic.  Eyes: Pupils are equal, round, and reactive to light.  Neck: Neck supple. No JVD present. No tracheal deviation present. No thyromegaly present.  Cardiovascular: Normal rate, regular rhythm and intact distal pulses.   Murmur heard. Pacemaker  Respiratory: Effort normal and breath sounds normal. No respiratory distress. She has no wheezes.  GI: Soft. There is no tenderness. There is no guarding.  Musculoskeletal:       Right knee: She exhibits decreased range of motion, swelling and bony tenderness. She exhibits no deformity, no laceration and no erythema. Tenderness found.  Lymphadenopathy:    She has no cervical adenopathy.  Neurological: She is alert and oriented to person, place, and time.  Skin: Skin is warm and dry.  Psychiatric: She has a normal mood and affect.     Labs:  Estimated body mass index is 23.94 kg/(m^2) as calculated from the following:   Height as of 08/17/13: 5\' 3"  (1.6 m).   Weight as of 12/04/13: 61.292 kg (135 lb 2 oz).   Imaging Review Plain radiographs demonstrate severe degenerative joint disease of the right knee(s). The overall alignment is neutral. The bone quality appears to be good for age and reported activity level.  Assessment/Plan:  End stage arthritis, right knee   The patient history, physical examination, clinical judgment of the provider and imaging studies are consistent with end stage degenerative joint disease of the right knee(s) and total knee arthroplasty is deemed medically necessary. The treatment options including medical management, injection therapy arthroscopy and arthroplasty were discussed at length. The risks and benefits of total knee arthroplasty were presented and reviewed. The risks due to aseptic loosening, infection, stiffness, patella tracking problems, thromboembolic complications and other imponderables were discussed. The patient acknowledged  the explanation, agreed to proceed with the plan and consent was signed. Patient is being admitted for inpatient treatment for surgery, pain control, PT, OT, prophylactic antibiotics, VTE prophylaxis, progressive ambulation and ADL's and discharge planning. The patient is planning to be discharged home with home health services.     West Pugh Rosemarie Galvis   PA-C  12/30/2013, 2:55 PM

## 2014-01-07 ENCOUNTER — Encounter (HOSPITAL_COMMUNITY): Payer: Medicare Other | Admitting: Anesthesiology

## 2014-01-07 ENCOUNTER — Encounter (HOSPITAL_COMMUNITY)
Admission: RE | Disposition: A | Discharge status: Home Health | Payer: Self-pay | Source: Ambulatory Visit | Attending: Orthopedic Surgery

## 2014-01-07 ENCOUNTER — Inpatient Hospital Stay (HOSPITAL_COMMUNITY)
Admission: RE | Admit: 2014-01-07 | Discharge: 2014-01-08 | DRG: 470 | Disposition: A | Discharge status: Home Health | Payer: Medicare Other | Source: Ambulatory Visit | Attending: Orthopedic Surgery | Admitting: Orthopedic Surgery

## 2014-01-07 ENCOUNTER — Inpatient Hospital Stay (HOSPITAL_COMMUNITY): Payer: Medicare Other | Admitting: Anesthesiology

## 2014-01-07 ENCOUNTER — Encounter (HOSPITAL_COMMUNITY): Payer: Self-pay | Admitting: Anesthesiology

## 2014-01-07 DIAGNOSIS — Z01812 Encounter for preprocedural laboratory examination: Secondary | ICD-10-CM | POA: Diagnosis not present

## 2014-01-07 DIAGNOSIS — M25561 Pain in right knee: Secondary | ICD-10-CM | POA: Diagnosis present

## 2014-01-07 DIAGNOSIS — M179 Osteoarthritis of knee, unspecified: Principal | ICD-10-CM | POA: Diagnosis present

## 2014-01-07 DIAGNOSIS — I2510 Atherosclerotic heart disease of native coronary artery without angina pectoris: Secondary | ICD-10-CM | POA: Diagnosis present

## 2014-01-07 DIAGNOSIS — E785 Hyperlipidemia, unspecified: Secondary | ICD-10-CM | POA: Diagnosis present

## 2014-01-07 DIAGNOSIS — Z950 Presence of cardiac pacemaker: Secondary | ICD-10-CM | POA: Diagnosis not present

## 2014-01-07 DIAGNOSIS — Z96651 Presence of right artificial knee joint: Secondary | ICD-10-CM

## 2014-01-07 DIAGNOSIS — M659 Synovitis and tenosynovitis, unspecified: Secondary | ICD-10-CM | POA: Diagnosis present

## 2014-01-07 DIAGNOSIS — Z8249 Family history of ischemic heart disease and other diseases of the circulatory system: Secondary | ICD-10-CM

## 2014-01-07 DIAGNOSIS — Z6823 Body mass index (BMI) 23.0-23.9, adult: Secondary | ICD-10-CM

## 2014-01-07 DIAGNOSIS — Z96659 Presence of unspecified artificial knee joint: Secondary | ICD-10-CM

## 2014-01-07 DIAGNOSIS — Z952 Presence of prosthetic heart valve: Secondary | ICD-10-CM

## 2014-01-07 DIAGNOSIS — Z87891 Personal history of nicotine dependence: Secondary | ICD-10-CM | POA: Diagnosis not present

## 2014-01-07 HISTORY — PX: TOTAL KNEE ARTHROPLASTY: SHX125

## 2014-01-07 LAB — ABO/RH: ABO/RH(D): O NEG

## 2014-01-07 LAB — TYPE AND SCREEN
ABO/RH(D): O NEG
ANTIBODY SCREEN: NEGATIVE

## 2014-01-07 SURGERY — ARTHROPLASTY, KNEE, TOTAL
Anesthesia: Spinal | Site: Knee | Laterality: Right

## 2014-01-07 MED ORDER — CELECOXIB 200 MG PO CAPS
200.0000 mg | ORAL_CAPSULE | Freq: Two times a day (BID) | ORAL | Status: DC
  Administered 2014-01-07 – 2014-01-08 (×2): 200 mg via ORAL
  Filled 2014-01-07 (×3): qty 1

## 2014-01-07 MED ORDER — BISACODYL 10 MG RE SUPP: 10.0000 mg | Freq: Every day | RECTAL | Status: DC | PRN

## 2014-01-07 MED ORDER — MIDAZOLAM HCL 5 MG/5ML IJ SOLN
INTRAMUSCULAR | Status: DC | PRN
  Administered 2014-01-07 (×2): 1 mg via INTRAVENOUS

## 2014-01-07 MED ORDER — ATORVASTATIN CALCIUM 40 MG PO TABS
40.0000 mg | ORAL_TABLET | Freq: Every day | ORAL | Status: DC
  Administered 2014-01-07: 40 mg via ORAL
  Filled 2014-01-07 (×2): qty 1

## 2014-01-07 MED ORDER — PROPOFOL 10 MG/ML IV BOLUS
INTRAVENOUS | Status: AC
  Filled 2014-01-07: qty 20

## 2014-01-07 MED ORDER — TRANEXAMIC ACID 100 MG/ML IV SOLN
2000.0000 mg | INTRAVENOUS | Status: DC | PRN
  Administered 2014-01-07: 2000 mg via TOPICAL

## 2014-01-07 MED ORDER — SODIUM CHLORIDE 0.9 % IR SOLN
Status: DC | PRN
  Administered 2014-01-07: 1000 mL

## 2014-01-07 MED ORDER — FERROUS SULFATE 325 (65 FE) MG PO TABS
325.0000 mg | ORAL_TABLET | Freq: Three times a day (TID) | ORAL | Status: DC
  Administered 2014-01-08: 325 mg via ORAL
  Filled 2014-01-07 (×5): qty 1

## 2014-01-07 MED ORDER — HYDROMORPHONE HCL 1 MG/ML IJ SOLN
0.2500 mg | INTRAMUSCULAR | Status: DC | PRN
  Administered 2014-01-07 (×2): 0.25 mg via INTRAVENOUS

## 2014-01-07 MED ORDER — BUPIVACAINE-EPINEPHRINE (PF) 0.25% -1:200000 IJ SOLN
INTRAMUSCULAR | Status: AC
  Filled 2014-01-07: qty 30

## 2014-01-07 MED ORDER — DEXAMETHASONE SODIUM PHOSPHATE 10 MG/ML IJ SOLN
INTRAMUSCULAR | Status: DC | PRN
  Administered 2014-01-07: 10 mg via INTRAVENOUS

## 2014-01-07 MED ORDER — 0.9 % SODIUM CHLORIDE (POUR BTL) OPTIME
TOPICAL | Status: DC | PRN
  Administered 2014-01-07: 1000 mL

## 2014-01-07 MED ORDER — CHLORHEXIDINE GLUCONATE 4 % EX LIQD: 60.0000 mL | Freq: Once | CUTANEOUS | Status: DC

## 2014-01-07 MED ORDER — CEFAZOLIN SODIUM-DEXTROSE 2-3 GM-% IV SOLR
2.0000 g | INTRAVENOUS | Status: AC
  Administered 2014-01-07: 2 g via INTRAVENOUS

## 2014-01-07 MED ORDER — MIDAZOLAM HCL 2 MG/2ML IJ SOLN
INTRAMUSCULAR | Status: AC
  Filled 2014-01-07: qty 2

## 2014-01-07 MED ORDER — MAGNESIUM CITRATE PO SOLN: 1.0000 | Freq: Once | ORAL | Status: AC | PRN

## 2014-01-07 MED ORDER — SODIUM CHLORIDE 0.9 % IV SOLN
10.0000 mg | INTRAVENOUS | Status: DC | PRN
  Administered 2014-01-07: 40 ug/min via INTRAVENOUS

## 2014-01-07 MED ORDER — FENTANYL CITRATE 0.05 MG/ML IJ SOLN
INTRAMUSCULAR | Status: DC | PRN
  Administered 2014-01-07 (×2): 50 ug via INTRAVENOUS

## 2014-01-07 MED ORDER — KETOROLAC TROMETHAMINE 30 MG/ML IJ SOLN
INTRAMUSCULAR | Status: AC
  Filled 2014-01-07: qty 1

## 2014-01-07 MED ORDER — LACTATED RINGERS IV SOLN
INTRAVENOUS | Status: DC
  Administered 2014-01-07 (×2): via INTRAVENOUS
  Administered 2014-01-07: 100 mL via INTRAVENOUS

## 2014-01-07 MED ORDER — TRETINOIN MICROSPHERE 0.1 % EX GEL: 1.0000 "application " | Freq: Every day | CUTANEOUS | Status: DC

## 2014-01-07 MED ORDER — CEFAZOLIN SODIUM-DEXTROSE 2-3 GM-% IV SOLR
INTRAVENOUS | Status: AC
  Filled 2014-01-07: qty 50

## 2014-01-07 MED ORDER — POLYETHYLENE GLYCOL 3350 17 G PO PACK: 17.0000 g | PACK | Freq: Every day | ORAL | Status: DC | PRN

## 2014-01-07 MED ORDER — CEFAZOLIN SODIUM-DEXTROSE 2-3 GM-% IV SOLR
2.0000 g | Freq: Four times a day (QID) | INTRAVENOUS | Status: AC
  Administered 2014-01-07 – 2014-01-08 (×2): 2 g via INTRAVENOUS
  Filled 2014-01-07 (×2): qty 50

## 2014-01-07 MED ORDER — KETOROLAC TROMETHAMINE 30 MG/ML IJ SOLN
INTRAMUSCULAR | Status: DC | PRN
  Administered 2014-01-07: 30 mg via INTRAVENOUS

## 2014-01-07 MED ORDER — ONDANSETRON HCL 4 MG PO TABS: 4.0000 mg | ORAL_TABLET | Freq: Four times a day (QID) | ORAL | Status: DC | PRN

## 2014-01-07 MED ORDER — HYDROMORPHONE HCL 1 MG/ML IJ SOLN: 0.5000 mg | INTRAMUSCULAR | Status: DC | PRN

## 2014-01-07 MED ORDER — RIVAROXABAN 10 MG PO TABS
10.0000 mg | ORAL_TABLET | ORAL | Status: DC
  Administered 2014-01-08: 10 mg via ORAL
  Filled 2014-01-07 (×2): qty 1

## 2014-01-07 MED ORDER — DOCUSATE SODIUM 100 MG PO CAPS
100.0000 mg | ORAL_CAPSULE | Freq: Two times a day (BID) | ORAL | Status: DC
  Administered 2014-01-07 – 2014-01-08 (×2): 100 mg via ORAL

## 2014-01-07 MED ORDER — PHENOL 1.4 % MT LIQD: 1.0000 | OROMUCOSAL | Status: DC | PRN

## 2014-01-07 MED ORDER — DEXAMETHASONE SODIUM PHOSPHATE 10 MG/ML IJ SOLN
10.0000 mg | Freq: Once | INTRAMUSCULAR | Status: AC
  Administered 2014-01-08: 10 mg via INTRAVENOUS
  Filled 2014-01-07: qty 1

## 2014-01-07 MED ORDER — PROMETHAZINE HCL 25 MG/ML IJ SOLN: 6.2500 mg | INTRAMUSCULAR | Status: DC | PRN

## 2014-01-07 MED ORDER — BUPIVACAINE-EPINEPHRINE (PF) 0.25% -1:200000 IJ SOLN
INTRAMUSCULAR | Status: DC | PRN
  Administered 2014-01-07: 30 mL

## 2014-01-07 MED ORDER — METOCLOPRAMIDE HCL 10 MG PO TABS: 5.0000 mg | ORAL_TABLET | Freq: Three times a day (TID) | ORAL | Status: DC | PRN

## 2014-01-07 MED ORDER — DIPHENHYDRAMINE HCL 25 MG PO CAPS: 25.0000 mg | ORAL_CAPSULE | Freq: Four times a day (QID) | ORAL | Status: DC | PRN

## 2014-01-07 MED ORDER — SODIUM CHLORIDE 0.9 % IJ SOLN
INTRAMUSCULAR | Status: DC | PRN
  Administered 2014-01-07: 9 mL via INTRAVENOUS

## 2014-01-07 MED ORDER — ONDANSETRON HCL 4 MG/2ML IJ SOLN
INTRAMUSCULAR | Status: DC | PRN
  Administered 2014-01-07 (×2): 2 mg via INTRAVENOUS

## 2014-01-07 MED ORDER — ALUM & MAG HYDROXIDE-SIMETH 200-200-20 MG/5ML PO SUSP: 30.0000 mL | ORAL | Status: DC | PRN

## 2014-01-07 MED ORDER — SODIUM CHLORIDE 0.9 % IJ SOLN
INTRAMUSCULAR | Status: AC
Start: 2014-01-07 — End: 2014-01-07
  Filled 2014-01-07: qty 10

## 2014-01-07 MED ORDER — METHOCARBAMOL 500 MG PO TABS
500.0000 mg | ORAL_TABLET | Freq: Four times a day (QID) | ORAL | Status: DC | PRN
  Administered 2014-01-07 – 2014-01-08 (×2): 500 mg via ORAL
  Filled 2014-01-07 (×2): qty 1

## 2014-01-07 MED ORDER — MENTHOL 3 MG MT LOZG: 1.0000 | LOZENGE | OROMUCOSAL | Status: DC | PRN

## 2014-01-07 MED ORDER — ONDANSETRON HCL 4 MG/2ML IJ SOLN
INTRAMUSCULAR | Status: AC
  Filled 2014-01-07: qty 2

## 2014-01-07 MED ORDER — METHOCARBAMOL 1000 MG/10ML IJ SOLN
500.0000 mg | Freq: Four times a day (QID) | INTRAVENOUS | Status: DC | PRN
  Administered 2014-01-07: 500 mg via INTRAVENOUS
  Filled 2014-01-07: qty 5

## 2014-01-07 MED ORDER — FENTANYL CITRATE 0.05 MG/ML IJ SOLN
INTRAMUSCULAR | Status: AC
  Filled 2014-01-07: qty 2

## 2014-01-07 MED ORDER — TRANEXAMIC ACID 100 MG/ML IV SOLN
2000.0000 mg | Freq: Once | INTRAVENOUS | Status: DC
  Filled 2014-01-07: qty 20

## 2014-01-07 MED ORDER — METOCLOPRAMIDE HCL 5 MG/ML IJ SOLN
5.0000 mg | Freq: Three times a day (TID) | INTRAMUSCULAR | Status: DC | PRN
Start: 2014-01-07 — End: 2014-01-08

## 2014-01-07 MED ORDER — HYDROCODONE-ACETAMINOPHEN 7.5-325 MG PO TABS
1.0000 | ORAL_TABLET | ORAL | Status: DC
  Administered 2014-01-07 – 2014-01-08 (×5): 1 via ORAL
  Filled 2014-01-07 (×5): qty 1

## 2014-01-07 MED ORDER — HYDROMORPHONE HCL 1 MG/ML IJ SOLN
INTRAMUSCULAR | Status: AC
  Filled 2014-01-07: qty 1

## 2014-01-07 MED ORDER — ONDANSETRON HCL 4 MG/2ML IJ SOLN: 4.0000 mg | Freq: Four times a day (QID) | INTRAMUSCULAR | Status: DC | PRN

## 2014-01-07 MED ORDER — SODIUM CHLORIDE 0.9 % IV SOLN
INTRAVENOUS | Status: DC
  Administered 2014-01-07: 19:00:00 via INTRAVENOUS
  Filled 2014-01-07 (×3): qty 1000

## 2014-01-07 MED ORDER — DEXAMETHASONE SODIUM PHOSPHATE 10 MG/ML IJ SOLN
10.0000 mg | Freq: Once | INTRAMUSCULAR | Status: DC
Start: 2014-01-07 — End: 2014-01-07

## 2014-01-07 MED ORDER — PROPOFOL INFUSION 10 MG/ML OPTIME
INTRAVENOUS | Status: DC | PRN
  Administered 2014-01-07: 100 ug/kg/min via INTRAVENOUS

## 2014-01-07 MED ORDER — DEXAMETHASONE SODIUM PHOSPHATE 10 MG/ML IJ SOLN
INTRAMUSCULAR | Status: AC
  Filled 2014-01-07: qty 1

## 2014-01-07 MED ORDER — BUPIVACAINE IN DEXTROSE 0.75-8.25 % IT SOLN
INTRATHECAL | Status: DC | PRN
  Administered 2014-01-07: 2 mL via INTRATHECAL

## 2014-01-07 SURGICAL SUPPLY — 49 items
BAG ZIPLOCK 12X15 (MISCELLANEOUS) IMPLANT
BANDAGE ELASTIC 6 VELCRO ST LF (GAUZE/BANDAGES/DRESSINGS) ×3 IMPLANT
BANDAGE ESMARK 6X9 LF (GAUZE/BANDAGES/DRESSINGS) ×1 IMPLANT
BLADE SAW SGTL 13.0X1.19X90.0M (BLADE) ×3 IMPLANT
BNDG ESMARK 6X9 LF (GAUZE/BANDAGES/DRESSINGS) ×3
BOWL SMART MIX CTS (DISPOSABLE) ×3 IMPLANT
CAPT RP KNEE ×3 IMPLANT
CEMENT HV SMART SET (Cement) ×6 IMPLANT
CUFF TOURN SGL QUICK 34 (TOURNIQUET CUFF) ×2
CUFF TRNQT CYL 34X4X40X1 (TOURNIQUET CUFF) ×1 IMPLANT
DERMABOND ADVANCED (GAUZE/BANDAGES/DRESSINGS) ×2
DERMABOND ADVANCED .7 DNX12 (GAUZE/BANDAGES/DRESSINGS) ×1 IMPLANT
DRAPE EXTREMITY TIBURON (DRAPES) ×3 IMPLANT
DRAPE POUCH INSTRU U-SHP 10X18 (DRAPES) ×3 IMPLANT
DRAPE U-SHAPE 47X51 STRL (DRAPES) ×3 IMPLANT
DRSG AQUACEL AG ADV 3.5X10 (GAUZE/BANDAGES/DRESSINGS) ×3 IMPLANT
DURAPREP 26ML APPLICATOR (WOUND CARE) ×6 IMPLANT
ELECT REM PT RETURN 9FT ADLT (ELECTROSURGICAL) ×3
ELECTRODE REM PT RTRN 9FT ADLT (ELECTROSURGICAL) ×1 IMPLANT
FACESHIELD WRAPAROUND (MASK) ×12 IMPLANT
GLOVE BIOGEL PI IND STRL 7.5 (GLOVE) ×1 IMPLANT
GLOVE BIOGEL PI IND STRL 8.5 (GLOVE) ×1 IMPLANT
GLOVE BIOGEL PI INDICATOR 7.5 (GLOVE) ×2
GLOVE BIOGEL PI INDICATOR 8.5 (GLOVE) ×2
GLOVE ECLIPSE 8.0 STRL XLNG CF (GLOVE) ×3 IMPLANT
GLOVE ORTHO TXT STRL SZ7.5 (GLOVE) ×6 IMPLANT
GOWN SPEC L3 XXLG W/TWL (GOWN DISPOSABLE) ×3 IMPLANT
GOWN STRL REUS W/TWL LRG LVL3 (GOWN DISPOSABLE) ×3 IMPLANT
HANDPIECE INTERPULSE COAX TIP (DISPOSABLE) ×2
KIT BASIN OR (CUSTOM PROCEDURE TRAY) ×3 IMPLANT
MANIFOLD NEPTUNE II (INSTRUMENTS) ×3 IMPLANT
NDL SAFETY ECLIPSE 18X1.5 (NEEDLE) ×1 IMPLANT
NEEDLE HYPO 18GX1.5 SHARP (NEEDLE) ×2
PACK TOTAL JOINT (CUSTOM PROCEDURE TRAY) ×3 IMPLANT
POSITIONER SURGICAL ARM (MISCELLANEOUS) ×3 IMPLANT
SET HNDPC FAN SPRY TIP SCT (DISPOSABLE) ×1 IMPLANT
SET PAD KNEE POSITIONER (MISCELLANEOUS) ×3 IMPLANT
SUCTION FRAZIER 12FR DISP (SUCTIONS) ×3 IMPLANT
SUT MNCRL AB 4-0 PS2 18 (SUTURE) ×3 IMPLANT
SUT VIC AB 1 CT1 36 (SUTURE) ×3 IMPLANT
SUT VIC AB 2-0 CT1 27 (SUTURE) ×6
SUT VIC AB 2-0 CT1 TAPERPNT 27 (SUTURE) ×3 IMPLANT
SUT VLOC 180 0 24IN GS25 (SUTURE) ×3 IMPLANT
SYR 50ML LL SCALE MARK (SYRINGE) ×3 IMPLANT
TOWEL OR 17X26 10 PK STRL BLUE (TOWEL DISPOSABLE) ×3 IMPLANT
TOWEL OR NON WOVEN STRL DISP B (DISPOSABLE) IMPLANT
TRAY FOLEY CATH 14FRSI W/METER (CATHETERS) ×3 IMPLANT
WATER STERILE IRR 1500ML POUR (IV SOLUTION) ×3 IMPLANT
WRAP KNEE MAXI GEL POST OP (GAUZE/BANDAGES/DRESSINGS) ×3 IMPLANT

## 2014-01-07 NOTE — Anesthesia Procedure Notes (Signed)
Spinal  Patient location during procedure: OR Start time: 01/07/2014 3:30 PM Staffing CRNA/Resident: Dion Saucier E Performed by: resident/CRNA  Preanesthetic Checklist Completed: patient identified, site marked, surgical consent, pre-op evaluation, timeout performed, IV checked, risks and benefits discussed and monitors and equipment checked Spinal Block Patient position: sitting Prep: Betadine Patient monitoring: heart rate, continuous pulse ox and blood pressure Approach: midline Location: L2-3 Injection technique: single-shot Needle Needle type: Spinocan  Needle gauge: 22 G Needle length: 9 cm Additional Notes Kit expiration date checked.  Negative heme, paresthesias, clear csf, tolerated well.

## 2014-01-07 NOTE — Interval H&P Note (Signed)
History and Physical Interval Note:  01/07/2014 2:39 PM  Angel Waller  has presented today for surgery, with the diagnosis of RIGHT KNEE OA  The various methods of treatment have been discussed with the patient and family. After consideration of risks, benefits and other options for treatment, the patient has consented to  Procedure(s): RIGHT TOTAL KNEE ARTHROPLASTY (Right) as a surgical intervention .  The patient's history has been reviewed, patient examined, no change in status, stable for surgery.  I have reviewed the patient's chart and labs.  Questions were answered to the patient's satisfaction.     Mauri Pole

## 2014-01-07 NOTE — Transfer of Care (Signed)
Immediate Anesthesia Transfer of Care Note  Patient: Angel Waller  Procedure(s) Performed: Procedure(s): RIGHT TOTAL KNEE ARTHROPLASTY (Right)  Patient Location: PACU  Anesthesia Type:Spinal  Level of Consciousness: awake, alert , oriented and patient cooperative  Airway & Oxygen Therapy: Patient Spontanous Breathing and Patient connected to face mask oxygen  Post-op Assessment: Report given to PACU RN and Post -op Vital signs reviewed and stable  Post vital signs: Reviewed and stable  Complications: No apparent anesthesia complications

## 2014-01-07 NOTE — Anesthesia Preprocedure Evaluation (Addendum)
Anesthesia Evaluation  Patient identified by MRN, date of birth, ID band Patient awake    Reviewed: Allergy & Precautions, H&P , NPO status , Patient's Chart, lab work & pertinent test results  Airway Mallampati: II TM Distance: >3 FB Neck ROM: Full    Dental no notable dental hx.    Pulmonary neg pulmonary ROS, former smoker,  breath sounds clear to auscultation  Pulmonary exam normal       Cardiovascular + dysrhythmias + pacemaker Rhythm:Regular Rate:Normal  H/O complete heart block now with pacemaker. S/P Aortic valve replacement  ECHO reviewed.   Neuro/Psych  Headaches, negative psych ROS   GI/Hepatic negative GI ROS, Neg liver ROS,   Endo/Other  negative endocrine ROS  Renal/GU negative Renal ROS  negative genitourinary   Musculoskeletal  (+) Arthritis -,   Abdominal   Peds negative pediatric ROS (+)  Hematology negative hematology ROS (+)   Anesthesia Other Findings   Reproductive/Obstetrics negative OB ROS                          Anesthesia Physical Anesthesia Plan  ASA: III  Anesthesia Plan: Spinal   Post-op Pain Management:    Induction: Intravenous  Airway Management Planned:   Additional Equipment:   Intra-op Plan:   Post-operative Plan:   Informed Consent: I have reviewed the patients History and Physical, chart, labs and discussed the procedure including the risks, benefits and alternatives for the proposed anesthesia with the patient or authorized representative who has indicated his/her understanding and acceptance.   Dental advisory given  Plan Discussed with: CRNA  Anesthesia Plan Comments: (Discussed spinal and general. She prefers spinal. Discussed risks/benefits of spinal including headache, backache, failure, bleeding, infection, and nerve damage. Patient consents to spinal. Questions answered. Coagulation studies and platelet count acceptable.)        Anesthesia Quick Evaluation

## 2014-01-07 NOTE — Op Note (Signed)
NAME:  Angel Waller                      MEDICAL RECORD NO.:  [id number]                             FACILITY:  [facility]      PHYSICIAN:  Pietro Cassis. Alvan Dame, M.D.  DATE OF BIRTH:  04/25/1945      DATE OF PROCEDURE:  01/07/2014                                     OPERATIVE REPORT         PREOPERATIVE DIAGNOSIS:  Right knee osteoarthritis.      POSTOPERATIVE DIAGNOSIS:  Right knee osteoarthritis.      FINDINGS:  The patient was noted to have complete loss of cartilage and   bone-on-bone arthritis with associated osteophytes in all three compartments of   the knee with a significant synovitis and associated effusion.      PROCEDURE:  Right total knee replacement.      COMPONENTS USED:  [employer] rotating platform posterior stabilized knee   system, a size 3 femur, 3 tibia, 15 mm PS insert, and 38 patellar   button.      SURGEON:  Pietro Cassis. Alvan Dame, M.D.      ASSISTANT:  Danae Orleans, PA-C.      ANESTHESIA:  Spinal.      SPECIMENS:  None.      COMPLICATION:  None.      DRAINS:  None.  EBL: <100cc      TOURNIQUET TIME:   Total Tourniquet Time Documented: Thigh (Right) - 28 minutes Total: Thigh (Right) - 28 minutes  .      The patient was stable to the recovery room.      INDICATION FOR PROCEDURE:  Angel Waller is a 68 y.o. female patient of   mine.  The patient had been seen, evaluated, and treated conservatively in the   office with medication, activity modification, and injections.  The patient had   radiographic changes of bone-on-bone arthritis with endplate sclerosis and osteophytes noted.      The patient failed conservative measures including medication, injections, and activity modification, and at this point was ready for more definitive measures.   Based on the radiographic changes and failed conservative measures, the patient   decided to proceed with total knee replacement.  Risks of infection,   DVT, component failure, need for revision surgery, postop  course, and   expectations were all   discussed and reviewed.  Consent was obtained for benefit of pain   relief.      PROCEDURE IN DETAIL:  The patient was brought to the operative theater.   Once adequate anesthesia, preoperative antibiotics, 2 gm of Ancef administered, the patient was positioned supine with the right thigh tourniquet placed.  The  right lower extremity was prepped and draped in sterile fashion.  A time-   out was performed identifying the patient, planned procedure, and   extremity.      The right lower extremity was placed in the [employer] leg holder.  The leg was   exsanguinated, tourniquet elevated to 250 mmHg.  A midline incision was   made followed by median parapatellar arthrotomy.  Following initial   exposure, attention was first directed  to the patella.  Precut   measurement was noted to be 21 mm.  I resected down to 13 mm and used a   38 patellar button to restore patellar height as well as cover the cut   surface.      The lug holes were drilled and a metal shim was placed to protect the   patella from retractors and saw blades.      At this point, attention was now directed to the femur.  The femoral   canal was opened with a drill, irrigated to try to prevent fat emboli.  An   intramedullary rod was passed at 3 degrees valgus, 9 mm of bone was   resected off the distal femur.  Following this resection, the tibia was   subluxated anteriorly.  Using the extramedullary guide, 3-4 mm of bone was resected off   the proximal medial tibia, the most effected.  We confirmed the gap would be   stable medially and laterally with a at least the 10 mm insert as well as confirmed   the cut was perpendicular in the coronal plane, checking with an alignment rod.      Once this was done, I sized the femur to be a size 3 in the anterior-   posterior dimension, chose a standard component based on medial and   lateral dimension.  The size 3 rotation block was then pinned in    position anterior referenced using the C-clamp to set rotation.  The   anterior, posterior, and  chamfer cuts were made without difficulty nor   notching making certain that I was along the anterior cortex to help   with flexion gap stability.      The final box cut was made off the lateral aspect of distal femur.      At this point, the tibia was sized to be a size 3, the size 3 tray was   then pinned in position through the medial third of the tubercle,   drilled, and keel punched.  Trial reduction was now carried with a 3 femur,  3 tibia, a 12.5 then 15 mm insert, and the 38 patella botton.  The knee was brought to   extension, full extension with good flexion stability with the patella   tracking through the trochlea without application of pressure.  Given   all these findings, the trial components removed.  Final components were   opened and cement was mixed.  The knee was irrigated with normal saline   solution and pulse lavage.  The synovial lining was   then injected with 30cc of 0.25% Marcaine with epinephrine and 1 cc of Toradol,   total of 40 cc.      The knee was irrigated.  Final implants were then cemented onto clean and   dried cut surfaces of bone with the knee brought to extension with a 15 mm trial insert.      Once the cement had fully cured, the excess cement was removed   throughout the knee.  I confirmed I was satisfied with the range of   motion and stability, and the final 15 mm insert was chosen.  It was   placed into the knee. We injected a topical based Tranexamic Acid 2gm intra-articularly once the capsule was closed.     The tourniquet had been let down at 27 minutes.  No significant   hemostasis required.  The   extensor mechanism was then reapproximated using #  1 Vicryl with the knee   in flexion.  The remaining wound was closed with 2-0 Vicryl and running 4-0 Monocryl.   The knee was cleaned, dried, dressed sterilely using Dermabond and   Aquacel  dressing.  The patient was then   brought to recovery room in stable condition, tolerating the procedure   well.   Please note that Physician Assistant, Danae Orleans, was present for the entirety of the case, and was utilized for pre-operative positioning, peri-operative retractor management, general facilitation of the procedure.  He was also utilized for primary wound closure at the end of the case.              Pietro Cassis Alvan Dame, M.D.    01/07/2014 4:51 PM

## 2014-01-08 ENCOUNTER — Encounter (HOSPITAL_COMMUNITY): Payer: Self-pay | Admitting: Orthopedic Surgery

## 2014-01-08 LAB — CBC
HCT: 34.9 % — ABNORMAL LOW (ref 36.0–46.0)
Hemoglobin: 11.7 g/dL — ABNORMAL LOW (ref 12.0–15.0)
MCH: 30.8 pg (ref 26.0–34.0)
MCHC: 33.5 g/dL (ref 30.0–36.0)
MCV: 91.8 fL (ref 78.0–100.0)
PLATELETS: 155 10*3/uL (ref 150–400)
RBC: 3.8 MIL/uL — AB (ref 3.87–5.11)
RDW: 12.9 % (ref 11.5–15.5)
WBC: 8.6 10*3/uL (ref 4.0–10.5)

## 2014-01-08 LAB — BASIC METABOLIC PANEL
ANION GAP: 13 (ref 5–15)
BUN: 12 mg/dL (ref 6–23)
CHLORIDE: 101 meq/L (ref 96–112)
CO2: 22 meq/L (ref 19–32)
Calcium: 8.6 mg/dL (ref 8.4–10.5)
Creatinine, Ser: 0.54 mg/dL (ref 0.50–1.10)
GFR calc Af Amer: >90 mL/min (ref >90)
GFR calc non Af Amer: >90 mL/min (ref >90)
Glucose, Bld: 191 mg/dL — ABNORMAL HIGH (ref 70–99)
Potassium: 4.3 mEq/L (ref 3.7–5.3)
SODIUM: 136 meq/L — AB (ref 137–147)

## 2014-01-08 MED ORDER — POLYETHYLENE GLYCOL 3350 17 G PO PACK: 17.0000 g | PACK | Freq: Every day | ORAL | Status: DC | PRN

## 2014-01-08 MED ORDER — METHOCARBAMOL 500 MG PO TABS: 500.0000 mg | ORAL_TABLET | Freq: Four times a day (QID) | ORAL | Status: DC | PRN

## 2014-01-08 MED ORDER — FERROUS SULFATE 325 (65 FE) MG PO TABS: 325.0000 mg | ORAL_TABLET | Freq: Three times a day (TID) | ORAL | Status: DC

## 2014-01-08 MED ORDER — DSS 100 MG PO CAPS: 100.0000 mg | ORAL_CAPSULE | Freq: Two times a day (BID) | ORAL | Status: DC

## 2014-01-08 MED ORDER — HYDROCODONE-ACETAMINOPHEN 7.5-325 MG PO TABS: 1.0000 | ORAL_TABLET | ORAL | Status: DC | PRN

## 2014-01-08 MED ORDER — ASPIRIN EC 325 MG PO TBEC: 325.0000 mg | DELAYED_RELEASE_TABLET | Freq: Two times a day (BID) | ORAL | Status: AC

## 2014-01-08 MED ORDER — RIVAROXABAN 10 MG PO TABS: 10.0000 mg | ORAL_TABLET | ORAL | Status: DC

## 2014-01-08 NOTE — Discharge Summary (Signed)
Physician Discharge Summary  Patient ID: Angel Waller MRN: [id number] DOB/AGE: [birth year] 68 y.o.  Admit date: 01/07/2014 Discharge date: 01/08/2014   Procedures:  Procedure(s) (LRB): RIGHT TOTAL KNEE ARTHROPLASTY (Right)  Attending Physician:  Dr. Paralee Cancel   Admission Diagnoses:   Right knee OA / pain  Discharge Diagnoses:  Principal Problem:   S/P right TKA  Past Medical History  Diagnosis Date  . Seasonal allergies   . Hyperlipemia   . S/P AVR (aortic valve replacement)     July 2010  . Migraines     "occasionally" (02/29/2012)  . Arthritis     "right knee" (02/29/2012)  . Complete heart block   . Pacemaker     HPI: Angel Waller, 68 y.o. female, has a history of pain and functional disability in the right knee due to arthritis and has failed non-surgical conservative treatments for greater than 12 weeks to includeNSAID's and/or analgesics, corticosteriod injections, viscosupplementation injections and activity modification. Onset of symptoms was gradual, starting 1.5+ years ago with gradually worsening course since that time. The patient noted prior procedures on the knee to include arthroscopy and menisectomy on the right knee(s). Patient currently rates pain in the right knee(s) at 7 out of 10 with activity. Patient has worsening of pain with activity and weight bearing, pain that interferes with activities of daily living, pain with passive range of motion, crepitus and joint swelling. Patient has evidence of periarticular osteophytes and joint space narrowing by imaging studies. There is no active infection. Risks, benefits and expectations were discussed with the patient. Risks including but not limited to the risk of anesthesia, blood clots, nerve damage, blood vessel damage, failure of the prosthesis, infection and up to and including death. Patient understand the risks, benefits and expectations and wishes to proceed with surgery.   PCP: Annye Asa, MD    Discharged Condition: good  Hospital Course:  Patient underwent the above stated procedure on 01/07/2014. Patient tolerated the procedure well and brought to the recovery room in good condition and subsequently to the floor.  POD #1 BP: 127/47 ; Pulse: 60 ; Temp: 98.3 F (36.8 C) ; Resp: 18  Patient reports pain as mild thus far. Had a good night. Ready for therapy. Dorsiflexion/plantar flexion intact, incision: dressing C/D/I, no cellulitis present and compartment soft.   LABS  Basename    HGB  11.7  HCT  34.9    Discharge Exam: General appearance: alert, cooperative and no distress Extremities: Homans sign is negative, no sign of DVT, no edema, redness or tenderness in the calves or thighs and no ulcers, gangrene or trophic changes  Disposition:    Home with follow up in 2 weeks   Follow-up Information   Follow up with Mauri Pole, MD. Schedule an appointment as soon as possible for a visit in 2 weeks.   Specialty:  Orthopedic Surgery   Contact information:   [address] [place] [address] [phone]       Follow up with [facility] (home health physical therapy)    Contact information:   [address] [place] [address] [phone]       Discharge Instructions   Call MD / Call 911    Complete by:  As directed   If you experience chest pain or shortness of breath, CALL 911 and be transported to the hospital emergency room.  If you develope a fever above 101 F, pus (white drainage) or increased  drainage or redness at the wound, or calf pain, call your surgeon's office.     Change dressing    Complete by:  As directed   Maintain surgical dressing for 10-14 days, or until follow up in the clinic.     Constipation Prevention    Complete by:  As directed   Drink plenty of fluids.  Prune juice may be helpful.  You may use a stool softener, such as Colace (over the counter) 100 mg twice a day.  Use MiraLax (over the  counter) for constipation as needed.     Diet - low sodium heart healthy    Complete by:  As directed      Discharge instructions    Complete by:  As directed   Maintain surgical dressing for 10-14 days, or until follow up in the clinic. Follow up in 2 weeks at [facility]. Call with any questions or concerns.     Driving restrictions    Complete by:  As directed   No driving for 4 weeks     Increase activity slowly as tolerated    Complete by:  As directed      TED hose    Complete by:  As directed   Use stockings (TED hose) for 2 weeks on both leg(s).  You may remove them at night for sleeping.     Weight bearing as tolerated    Complete by:  As directed   Laterality:  right  Extremity:  Lower             Medication List    STOP taking these medications       aspirin 81 MG tablet  Replaced by:  aspirin EC 325 MG tablet     ciprofloxacin 500 MG tablet  Commonly known as:  CIPRO      TAKE these medications       amoxicillin 500 MG capsule  Commonly known as:  AMOXIL  Take 2,000 mg by mouth as needed (before dental procedures.).     aspirin EC 325 MG tablet  Take 1 tablet (325 mg total) by mouth 2 (two) times daily. Take for 4 weeks.  Start taking on:  01/23/2014     atorvastatin 40 MG tablet  Commonly known as:  LIPITOR  Take 40 mg by mouth at bedtime.     Bee Pollen 550 MG Caps  Take 2 tablets by mouth at bedtime.     DSS 100 MG Caps  Take 100 mg by mouth 2 (two) times daily.     estradiol 2 MG tablet  Commonly known as:  ESTRACE  Take 2 mg by mouth at bedtime.     ferrous sulfate 325 (65 FE) MG tablet  Take 1 tablet (325 mg total) by mouth 3 (three) times daily after meals.     HYDROcodone-acetaminophen 7.5-325 MG per tablet  Commonly known as:  NORCO  Take 1-2 tablets by mouth every 4 (four) hours as needed for moderate pain.     LOVAZA 1 G capsule  Generic drug:  omega-3 acid ethyl esters  Take 2 g by mouth at bedtime.      methocarbamol 500 MG tablet  Commonly known as:  ROBAXIN  Take 1 tablet (500 mg total) by mouth every 6 (six) hours as needed for muscle spasms.     norethindrone 5 MG tablet  Commonly known as:  AYGESTIN  Take 2.5 mg by mouth. Once Every 4 months for 10 days  polyethylene glycol packet  Commonly known as:  MIRALAX / GLYCOLAX  Take 17 g by mouth daily as needed for mild constipation.     rivaroxaban 10 MG Tabs tablet  Commonly known as:  XARELTO  Take 1 tablet (10 mg total) by mouth daily.     SOLUBLE FIBER/PROBIOTICS PO  Take 1 capsule by mouth at bedtime.     tretinoin microspheres 0.1 % gel  Commonly known as:  RETIN-A MICRO  Apply 1 application topically at bedtime.     vitamin C 500 MG tablet  Commonly known as:  ASCORBIC ACID  Take 500 mg by mouth at bedtime.     Vitamin D3 2000 UNITS Tabs  Take 1 tablet by mouth at bedtime.     vitamin E 1000 UNIT capsule  Take 1,000 Units by mouth daily.         Signed: West Pugh. Teiara Baria   PA-C  01/08/2014, 2:48 PM

## 2014-01-08 NOTE — Progress Notes (Signed)
Patient ID: Angel Waller, female   DOB: 1945-08-28, 68 y.o.   MRN: [id number] Subjective: 1 Day Post-Op Procedure(s) (LRB): RIGHT TOTAL KNEE ARTHROPLASTY (Right)    Patient reports pain as mild thus far.  Had a good night.  Ready for therapy  Objective:   VITALS:   Filed Vitals:   01/08/14 0555  BP: 127/47  Pulse: 60  Temp: 98.3 F (36.8 C)  Resp: 18    Neurovascular intact Incision: dressing C/D/I  LABS  Recent Labs  01/08/14 0505  HGB 11.7*  HCT 34.9*  WBC 8.6  PLT 155     Recent Labs  01/08/14 0505  NA 136*  K 4.3  BUN 12  CREATININE 0.54  GLUCOSE 191*    No results found for this basename: LABPT, INR,  in the last 72 hours   Assessment/Plan: 1 Day Post-Op Procedure(s) (LRB): RIGHT TOTAL KNEE ARTHROPLASTY (Right)   Advance diet Up with therapy Discharge home with home health today after therapy sessions

## 2014-01-08 NOTE — Progress Notes (Signed)
Pt d/c home with [facility]. No DME needs. AVS reviewed and "My Chart" discussed with pt. Pt capable of verbalizing medications, signs and symptoms of infection, and follow-up appointments. Remains hemodynamically stable. No signs and symptoms of distress. Educated pt to return to ER in the case of SOB, dizziness, or chest pain.

## 2014-01-08 NOTE — Evaluation (Signed)
Occupational Therapy Evaluation Patient Details Name: Angel Waller MRN: [id number] DOB: Dec 31, 1945 Today's Date: 01/08/2014    History of Present Illness pt is s/p R TKA   Clinical Impression   This 68 year old female was admitted for the above surgery.  All education was completed and no further OT is needed at this time.      Follow Up Recommendations  No OT follow up    Equipment Recommendations  None recommended by OT    Recommendations for Other Services       Precautions / Restrictions Precautions Precautions: Knee Restrictions Weight Bearing Restrictions: No      Mobility Bed Mobility               General bed mobility comments: supervision  Transfers Overall transfer level: Needs assistance Equipment used: Rolling walker (2 wheeled) Transfers: Sit to/from Stand Sit to Stand: Supervision         General transfer comment: cues for UE placement    Balance                                            ADL Overall ADL's : Needs assistance/impaired                         Toilet Transfer: Supervision/safety;Ambulation;Comfort height toilet             General ADL Comments: pt is able to get up from high commode with supervision.  She can complete ADLs with set up (except for ted hose. She has borrowed a tub bench:  explained getting on/off and using extra shower curtain liner or tucking existing one under her to decrease water on floor.  She has no further questions/concerns for OT     Vision                     Perception     Praxis      Pertinent Vitals/Pain Pain Assessment: 0-10 Pain Score: 2  Pain Location: R knee Pain Descriptors / Indicators: Sore Pain Intervention(s): Repositioned;Ice applied     Hand Dominance     Extremity/Trunk Assessment Upper Extremity Assessment Upper Extremity Assessment: Overall WFL for tasks assessed           Communication Communication Communication:  No difficulties   Cognition Arousal/Alertness: Awake/alert Behavior During Therapy: WFL for tasks assessed/performed Overall Cognitive Status: Within Functional Limits for tasks assessed                     General Comments       Exercises       Shoulder Instructions      Home Living Family/patient expects to be discharged to:: Private residence Living Arrangements: Spouse/significant other                 Bathroom Shower/Tub: Tub/shower unit (downstairs) Shower/tub characteristics: Architectural technologist: Handicapped height         Additional Comments: has borrowed tub transfer bench      Prior Functioning/Environment Level of Independence: Independent             OT Diagnosis: Generalized weakness   OT Problem List:     OT Treatment/Interventions:      OT Goals(Current goals can be found in the care plan section)    OT Frequency:  Barriers to D/C:            Co-evaluation              End of Session    Activity Tolerance: Patient tolerated treatment well Patient left: in bed;with call bell/phone within reach;with family/visitor present   Time: 2229-7989 OT Time Calculation (min): 10 min Charges:  OT General Charges $OT Visit: 1 Procedure OT Evaluation $Initial OT Evaluation Tier I: 1 Procedure G-Codes:    Angel Waller 2014-02-03, 11:39 AM   Lesle Chris, OTR/L [phone] 02/03/2014

## 2014-01-08 NOTE — Care Management Note (Signed)
Page 1 of 2   01/08/2014     11:28:25 AM  Mauri Pole, MD Physician Signed Orthopedics Progress Notes Service date: 01/08/2014 7:11 AM  Patient ID: [name], female DOB: [birth year], 68 y.o. MRN: [id number]  Subjective:  1 Day Post-Op Procedure(s) (LRB):  RIGHT TOTAL KNEE ARTHROPLASTY (Right)  Patient reports pain as mild thus far. Had a good night. Ready for therapy  Objective:  VITALS:    Filed Vitals:     01/08/14 0555    BP:  127/47    Pulse:  60    Temp:  98.3 F (36.8 C)    Resp:  18     Neurovascular intact  Incision: dressing C/D/I  LABS   Recent Labs      01/08/14 0505     HGB  11.7*     HCT  34.9*     WBC  8.6     PLT  155       Recent Labs      01/08/14 0505     NA  136*     K  4.3     BUN  12     CREATININE  0.54     GLUCOSE  191*      No results found for this basename: LABPT, INR, in the last 72 hours  Assessment/Plan:  1 Day Post-Op Procedure(s) (LRB):  RIGHT TOTAL KNEE ARTHROPLASTY (Right)  Advance diet  Up with therapy  Discharge home with home health today after therapy sessions            Mauri Pole, MD Physician Signed Orthopedics Progress Notes Service date: 01/08/2014 7:11 AM  Patient ID: [name], female DOB: 07-07-1945, 68 y.o. MRN: [id number]  Subjective:  1 Day Post-Op Procedure(s) (LRB):  RIGHT TOTAL KNEE ARTHROPLASTY (Right)  Patient reports pain as mild thus far. Had a good night. Ready for therapy  Objective:  VITALS:    Filed Vitals:     01/08/14 0555    BP:  127/47    Pulse:  60    Temp:  98.3 F (36.8 C)    Resp:  18     Neurovascular intact  Incision: dressing C/D/I  LABS   Recent Labs      01/08/14 0505     HGB  11.7*     HCT  34.9*     WBC  8.6     PLT  155       Recent Labs      01/08/14 0505     NA  136*     K  4.3     BUN  12     CREATININE  0.54     GLUCOSE  191*      No results found for this basename: LABPT, INR, in the last 72 hours  Assessment/Plan:  1 Day  Post-Op Procedure(s) (LRB):  RIGHT TOTAL KNEE ARTHROPLASTY (Right)  Advance diet  Up with therapy  Discharge home with home health today after therapy sessions           CARE MANAGEMENT NOTE 01/08/2014  Patient:  JALANI, CULLIFER   Account Number:  [id number]  Date Initiated:  01/08/2014  Documentation initiated by:  [facility]  Subjective/Objective Assessment:   adm: RIGHT TOTAL KNEE ARTHROPLASTY (Right)     Action/Plan:   discharge planning   Anticipated DC Date:  01/08/2014   Anticipated DC Plan:  [place]  DC Planning Services  CM consult      PAC Choice  HOME HEALTH   Choice offered to / List presented to:  C-1 Patient   DME arranged  NA      DME agency  NA        HH agency  [facility]   Status of service:  Completed, signed off Medicare Important Message given?   (If response is "NO", the following Medicare IM given date fields will be blank) Date Medicare IM given:   Medicare IM given by:   Date Additional Medicare IM given:   Additional Medicare IM given by:    Discharge Disposition:  HOME W HOME HEALTH SERVICES  Per UR Regulation:  Reviewed for med. necessity/level of care/duration of stay  If discussed at Long Length of Stay Meetings, dates discussed:    Comments:  01/08/14 11:00 CM met with pt in room to offer choice of home health agency.  Pt chooses [facility] to render HHPT. [facility] on unit and is aware of referral.  Address and contact information verified by pt.  No DME needed. DME delivery rep aware no needs. No other CM needs were communicated.  Sarah Jeffries, BSN, CM [phone].   

## 2014-01-08 NOTE — Evaluation (Signed)
Physical Therapy Evaluation Patient Details Name: Angel Waller MRN: [id number] DOB: [birth year] Today's Date: 01/08/2014   History of Present Illness  pt is s/p R TKA  Clinical Impression  Patient is recovering exceptionally well. All education completed.    Follow Up Recommendations Home health PT;Supervision - Intermittent    Equipment Recommendations  None recommended by PT    Recommendations for Other Services       Precautions / Restrictions Precautions Precautions: Knee Restrictions Weight Bearing Restrictions: No      Mobility  Bed Mobility               General bed mobility comments: supervision  Transfers Overall transfer level: Needs assistance Equipment used: Rolling walker (2 wheeled) Transfers: Sit to/from Stand Sit to Stand: Supervision         General transfer comment: cues for UE placement  Ambulation/Gait Ambulation/Gait assistance: Supervision Ambulation Distance (Feet): 150 Feet Assistive device: Rolling walker (2 wheeled) Gait Pattern/deviations: Step-through pattern;Decreased stance time - right     General Gait Details: cues for sequence and posture  Stairs Stairs: Yes Stairs assistance: Min guard Stair Management: Step to pattern;One rail Left;With cane      Wheelchair Mobility    Modified Rankin (Stroke Patients Only)       Balance                                             Pertinent Vitals/Pain Pain Assessment: 0-10 Pain Score: 0-No pain Pain Location: r knee Pain Descriptors / Indicators: Sore Pain Intervention(s): Ice applied;Monitored during session    Home Living Family/patient expects to be discharged to:: Private residence Living Arrangements: Spouse/significant other Available Help at Discharge: Family Type of Home: House Home Access: Stairs to enter Entrance Stairs-Rails: Psychiatric nurse of Steps: 5 Home Layout: Two level;Full bath on main level Home  Equipment: Walker - 2 wheels;Cane - single point Additional Comments: has borrowed tub transfer bench    Prior Function Level of Independence: Independent               Hand Dominance        Extremity/Trunk Assessment   Upper Extremity Assessment: Defer to OT evaluation           Lower Extremity Assessment: RLE deficits/detail RLE Deficits / Details: knee flexion 80deghrees, slr +    Cervical / Trunk Assessment: Normal  Communication   Communication: No difficulties  Cognition Arousal/Alertness: Awake/alert Behavior During Therapy: WFL for tasks assessed/performed Overall Cognitive Status: Within Functional Limits for tasks assessed                      General Comments      Exercises Total Joint Exercises Quad Sets: AROM;Right;10 reps Short Arc Quad: AROM;Right;10 reps Heel Slides: AROM;Right;10 reps Hip ABduction/ADduction: AROM;Right;10 reps Straight Leg Raises: AROM;Right;10 reps Long Arc Quad: AROM;Right;10 reps Goniometric ROM: 0-60 R knee      Assessment/Plan    PT Assessment All further PT needs can be met in the next venue of care  PT Diagnosis Difficulty walking   PT Problem List    PT Treatment Interventions     PT Goals (Current goals can be found in the Care Plan section) Acute Rehab PT Goals PT Goal Formulation: No goals set, d/c therapy    Frequency     Barriers to discharge  Co-evaluation               End of Session   Activity Tolerance: Patient tolerated treatment well Patient left: in bed;with call bell/phone within reach;with family/visitor present Nurse Communication: Mobility status         Time: 1035-1105 PT Time Calculation (min): 30 min   Charges:   PT Evaluation $Initial PT Evaluation Tier I: 1 Procedure PT Treatments $Gait Training: 8-22 mins $Therapeutic Exercise: 8-22 mins   PT G Codes:          Claretha Cooper 01/08/2014, 1:21 PM

## 2014-01-08 NOTE — Anesthesia Postprocedure Evaluation (Signed)
  Anesthesia Post-op Note  Patient: Angel Waller  Procedure(s) Performed: Procedure(s) (LRB): RIGHT TOTAL KNEE ARTHROPLASTY (Right)  Patient Location: PACU  Anesthesia Type: Spinal  Level of Consciousness: awake and alert   Airway and Oxygen Therapy: Patient Spontanous Breathing  Post-op Pain: mild  Post-op Assessment: Post-op Vital signs reviewed, Patient's Cardiovascular Status Stable, Respiratory Function Stable, Patent Airway and No signs of Nausea or vomiting  Last Vitals:  Filed Vitals:   01/08/14 0555  BP: 127/47  Pulse: 60  Temp: 36.8 C  Resp: 18    Post-op Vital Signs: stable   Complications: No apparent anesthesia complications

## 2014-01-08 NOTE — Discharge Instructions (Signed)
Information on my medicine - XARELTO (Rivaroxaban)  This medication education was reviewed with me or my healthcare representative as part of my discharge preparation.  The pharmacist that spoke with me during my hospital stay was:  Emiliano Dyer, RPH  Why was Xarelto prescribed for you? Xarelto was prescribed for you to reduce the risk of blood clots forming after orthopedic surgery. The medical term for these abnormal blood clots is venous thromboembolism (VTE).  What do you need to know about xarelto ? Take your Xarelto ONCE DAILY at the same time every day. You may take it either with or without food.  If you have difficulty swallowing the tablet whole, you may crush it and mix in applesauce just prior to taking your dose.  Take Xarelto exactly as prescribed by your doctor and DO NOT stop taking Xarelto without talking to the doctor who prescribed the medication.  Stopping without other VTE prevention medication to take the place of Xarelto may increase your risk of developing a clot.  After discharge, you should have regular check-up appointments with your healthcare provider that is prescribing your Xarelto.    What do you do if you miss a dose? If you miss a dose, take it as soon as you remember on the same day then continue your regularly scheduled once daily regimen the next day. Do not take two doses of Xarelto on the same day.   Important Safety Information A possible side effect of Xarelto is bleeding. You should call your healthcare provider right away if you experience any of the following:   Bleeding from an injury or your nose that does not stop.   Unusual colored urine (red or dark brown) or unusual colored stools (red or black).   Unusual bruising for unknown reasons.   A serious fall or if you hit your head (even if there is no bleeding).  Some medicines may interact with Xarelto and might increase your risk of bleeding while on Xarelto. To help avoid  this, consult your healthcare provider or pharmacist prior to using any new prescription or non-prescription medications, including herbals, vitamins, non-steroidal anti-inflammatory drugs (NSAIDs) and supplements.  This website has more information on Xarelto: [email or web address]

## 2014-01-09 NOTE — Progress Notes (Signed)
Discharge summary sent to payer through [facility]  

## 2014-01-31 ENCOUNTER — Ambulatory Visit: Payer: Medicare Other | Attending: Orthopedic Surgery | Admitting: Physical Therapy

## 2014-01-31 DIAGNOSIS — M1990 Unspecified osteoarthritis, unspecified site: Secondary | ICD-10-CM | POA: Insufficient documentation

## 2014-01-31 DIAGNOSIS — Z5189 Encounter for other specified aftercare: Secondary | ICD-10-CM | POA: Insufficient documentation

## 2014-01-31 DIAGNOSIS — R2689 Other abnormalities of gait and mobility: Secondary | ICD-10-CM | POA: Insufficient documentation

## 2014-01-31 DIAGNOSIS — Z950 Presence of cardiac pacemaker: Secondary | ICD-10-CM | POA: Insufficient documentation

## 2014-01-31 DIAGNOSIS — M25561 Pain in right knee: Secondary | ICD-10-CM | POA: Diagnosis not present

## 2014-01-31 DIAGNOSIS — M6281 Muscle weakness (generalized): Secondary | ICD-10-CM | POA: Insufficient documentation

## 2014-01-31 DIAGNOSIS — Z952 Presence of prosthetic heart valve: Secondary | ICD-10-CM | POA: Diagnosis not present

## 2014-01-31 DIAGNOSIS — Z96651 Presence of right artificial knee joint: Secondary | ICD-10-CM | POA: Insufficient documentation

## 2014-01-31 DIAGNOSIS — M25661 Stiffness of right knee, not elsewhere classified: Secondary | ICD-10-CM | POA: Insufficient documentation

## 2014-02-04 ENCOUNTER — Ambulatory Visit: Payer: Medicare Other

## 2014-02-05 ENCOUNTER — Ambulatory Visit: Payer: Medicare Other | Admitting: Rehabilitation

## 2014-02-05 DIAGNOSIS — Z5189 Encounter for other specified aftercare: Secondary | ICD-10-CM | POA: Diagnosis not present

## 2014-02-07 ENCOUNTER — Ambulatory Visit: Payer: Medicare Other | Admitting: Rehabilitation

## 2014-02-07 DIAGNOSIS — Z5189 Encounter for other specified aftercare: Secondary | ICD-10-CM | POA: Diagnosis not present

## 2014-02-12 ENCOUNTER — Ambulatory Visit: Payer: Medicare Other | Admitting: Rehabilitation

## 2014-02-12 DIAGNOSIS — Z5189 Encounter for other specified aftercare: Secondary | ICD-10-CM | POA: Diagnosis not present

## 2014-02-13 ENCOUNTER — Ambulatory Visit: Payer: Medicare Other | Admitting: Rehabilitation

## 2014-02-13 DIAGNOSIS — Z5189 Encounter for other specified aftercare: Secondary | ICD-10-CM | POA: Diagnosis not present

## 2014-02-15 ENCOUNTER — Ambulatory Visit: Payer: Medicare Other | Admitting: Rehabilitation

## 2014-02-15 DIAGNOSIS — Z5189 Encounter for other specified aftercare: Secondary | ICD-10-CM | POA: Diagnosis not present

## 2014-02-19 ENCOUNTER — Ambulatory Visit: Payer: Medicare Other | Admitting: Physical Therapy

## 2014-02-19 DIAGNOSIS — Z5189 Encounter for other specified aftercare: Secondary | ICD-10-CM | POA: Diagnosis not present

## 2014-02-20 ENCOUNTER — Ambulatory Visit: Payer: Medicare Other | Admitting: Rehabilitation

## 2014-02-20 DIAGNOSIS — Z5189 Encounter for other specified aftercare: Secondary | ICD-10-CM | POA: Diagnosis not present

## 2014-02-25 ENCOUNTER — Ambulatory Visit: Payer: Medicare Other | Admitting: Physical Therapy

## 2014-02-25 DIAGNOSIS — Z5189 Encounter for other specified aftercare: Secondary | ICD-10-CM | POA: Diagnosis not present

## 2014-02-26 ENCOUNTER — Encounter: Payer: Self-pay | Admitting: Medical

## 2014-02-26 ENCOUNTER — Ambulatory Visit (INDEPENDENT_AMBULATORY_CARE_PROVIDER_SITE_OTHER): Payer: 59 | Admitting: Medical

## 2014-02-26 VITALS — BP 136/78 | HR 70 | Temp 97.8°F | Ht 62.5 in | Wt 135.8 lb

## 2014-02-26 DIAGNOSIS — N3001 Acute cystitis with hematuria: Secondary | ICD-10-CM

## 2014-02-26 DIAGNOSIS — N390 Urinary tract infection, site not specified: Secondary | ICD-10-CM | POA: Insufficient documentation

## 2014-02-26 DIAGNOSIS — R82998 Other abnormal findings in urine: Secondary | ICD-10-CM

## 2014-02-26 DIAGNOSIS — R350 Frequency of micturition: Secondary | ICD-10-CM

## 2014-02-26 DIAGNOSIS — Z23 Encounter for immunization: Secondary | ICD-10-CM

## 2014-02-26 LAB — POCT URINALYSIS DIPSTICK
BILIRUBIN UA: NEGATIVE
Glucose, UA: NEGATIVE
KETONES UA: NEGATIVE
Nitrite, UA: NEGATIVE
PH UA: 6
Spec Grav, UA: 1.015
Urobilinogen, UA: 0.2

## 2014-02-26 MED ORDER — FLUCONAZOLE 150 MG PO TABS: 150.0000 mg | ORAL_TABLET | Freq: Once | ORAL | Status: DC

## 2014-02-26 MED ORDER — CIPROFLOXACIN HCL 500 MG PO TABS: 500.0000 mg | ORAL_TABLET | Freq: Two times a day (BID) | ORAL | Status: DC

## 2014-02-26 NOTE — Patient Instructions (Addendum)
Your appear to have a urinary tract infection. I am prescribing antibiotic for the probable infection. Hydrate well. I am sending out a urine culture. During the interim if your signs and symptoms worsen rather than improving please notify us. We will notify your when the culture results are back.  Follow up in 10 days or as needed.  I do want to check your urine for blood in 10 days.Make very early am appointment or early afternoon appointment.  Explained to pt with her history of smoking and blood in urine today that I want to recheck and make sure that on follow up post infection no blood is present.

## 2014-02-26 NOTE — Progress Notes (Signed)
Subjective:    Patient ID: Angel Waller, female    DOB: [birth year], 68 y.o.   MRN: [id number]  HPI   Pt in today reporting urinary symptoms x 2 days. No hx of in the past except only one. Just one uti before knee surgery 7 wks ago.Recently just got mild odor to urine with slight hesitant and frequent urination.  Dysuria-  No.Maybe mild odor Frequent urination-yes Hesitancy-yes Suprapubic pressure-no Fever-no chills-no Nausea-no Vomiting-no CVA pain-none History of UTI-very rare. Gross hematuria-no  Past Medical History  Diagnosis Date  . Seasonal allergies   . Hyperlipemia   . S/P AVR (aortic valve replacement)     July 2010  . Migraines     "occasionally" (02/29/2012)  . Arthritis     "right knee" (02/29/2012)  . Complete heart block   . Pacemaker     History   Social History  . Marital Status: Married    Spouse Name: N/A    Number of Children: N/A  . Years of Education: N/A   Occupational History  . Special Education Teacher    Social History Main Topics  . Smoking status: Former Smoker -- 0.50 packs/day for 42 years    Types: Cigarettes    Quit date: 03/30/2007  . Smokeless tobacco: Never Used     Comment: smoked ages [age], up to 1 pp WEEK  . Alcohol Use: No  . Drug Use: No  . Sexual Activity: Yes   Other Topics Concern  . Not on file   Social History Narrative    Past Surgical History  Procedure Laterality Date  . Colonoscopy  2005    negative; [place], [place]  . Tonsillectomy and adenoidectomy  1962  . Cardiac valve replacement  2010    tissue aortic valve; [facility]  . Permanent pacemaker insertion  03/02/2012    Medtronic Adapta L implanted by Dr Rayann Heman  . Knee arthroscopy Right   . Insert / replace / remove pacemaker    . Total knee arthroplasty Right 01/07/2014    Procedure: RIGHT TOTAL KNEE ARTHROPLASTY;  Surgeon: Mauri Pole, MD;  Location: [facility];  Service: Orthopedics;  Laterality: Right;    Family History  Problem Relation Age  of Onset  . Heart attack Mother 52    CABG  . Heart attack Father 71    aneurysm post MI  . Colon polyps Father   . Heart disease      in both M & P uncles & aunts; no premature MI  . Diabetes Neg Hx   . Stroke Neg Hx   . Colon cancer Neg Hx   . Pancreatic cancer Neg Hx   . Rectal cancer Neg Hx   . Stomach cancer Neg Hx     No Known Allergies  Current Outpatient Prescriptions on File Prior to Visit  Medication Sig Dispense Refill  . amoxicillin (AMOXIL) 500 MG capsule Take 2,000 mg by mouth as needed (before dental procedures.).     Marland Kitchen atorvastatin (LIPITOR) 40 MG tablet Take 40 mg by mouth at bedtime.    Raelyn Ensign Pollen 550 MG CAPS Take 2 tablets by mouth at bedtime.     . Cholecalciferol (VITAMIN D3) 2000 UNITS TABS Take 1 tablet by mouth at bedtime.     . docusate sodium 100 MG CAPS Take 100 mg by mouth 2 (two) times daily. 10 capsule 0  . estradiol (ESTRACE) 2 MG tablet Take 2 mg by mouth at bedtime.     Marland Kitchen  HYDROcodone-acetaminophen (NORCO) 7.5-325 MG per tablet Take 1-2 tablets by mouth every 4 (four) hours as needed for moderate pain. 100 tablet 0  . norethindrone (AYGESTIN) 5 MG tablet Take 2.5 mg by mouth. Once Every 4 months for 10 days    . omega-3 acid ethyl esters (LOVAZA) 1 G capsule Take 2 g by mouth at bedtime.    . polyethylene glycol (MIRALAX / GLYCOLAX) packet Take 17 g by mouth daily as needed for mild constipation. 14 each 0  . Probiotic Product (SOLUBLE FIBER/PROBIOTICS PO) Take 1 capsule by mouth at bedtime.     . rivaroxaban (XARELTO) 10 MG TABS tablet Take 1 tablet (10 mg total) by mouth daily. 14 tablet 0  . tretinoin microspheres (RETIN-A MICRO) 0.1 % gel Apply 1 application topically at bedtime.      No current facility-administered medications on file prior to visit.    BP 136/78 mmHg  Pulse 70  Temp(Src) 97.8 F (36.6 C) (Oral)  Ht 5' 2.5" (1.588 m)  Wt 135 lb 12.8 oz (61.598 kg)  BMI 24.43 kg/m2  SpO2 97%       Review of Systems    Constitutional: Negative for fever, chills, diaphoresis and fatigue.  Respiratory: Negative for cough, shortness of breath and wheezing.   Cardiovascular: Negative for chest pain and palpitations.  Gastrointestinal: Negative for nausea, vomiting, abdominal pain, diarrhea, constipation and blood in stool.  Genitourinary: Positive for urgency and frequency. Negative for dysuria, hematuria, flank pain, vaginal bleeding and difficulty urinating.       Hesitant urine flow at times.  Musculoskeletal: Negative for back pain.  Neurological: Negative.          Objective:   Physical Exam   General  Mental Status- Alert. Orientation- Orientation x 4.   Skin General:- Normal. Moisture- Dry. Temperature- Warm.  HEENT Head- normal.  Neck Neck- Supple.  Heart Ausculation-RRR  Lungs Ausculation- Clear, even, unlabored bilaterlly.    Abdomen Palpation/Percussion: Palpation and Percussion of the abdomen reveal- Non Tender, No Rebound tenderness, No Rigidity(guarding), No Palpable abdominal masses and No jar tenderness. No suprapubic tenderness. Liver:-Normal. Spleen:- Normal. Other Characteristics- No Costovertebral angle tenderness- Left or Costovertebral angle tenderness- Right.  Auscultation: Auscultation of the abdomen reveals- Bowel Sounds normal.        Assessment & Plan:

## 2014-02-26 NOTE — Assessment & Plan Note (Signed)
Your appear to have a urinary tract infection. I am prescribing antibiotic for the probable infection. Hydrate well. I am sending out a urine culture. During the interim if your signs and symptoms worsen rather than improving please notify us. We will notify your when the culture results are back.  Follow up in 10 days or as needed.  I do want to check your urine for blood in 10 days.Make very early am appointment or early afternoon appointment.

## 2014-02-26 NOTE — Progress Notes (Signed)
Pre visit review using our clinic review tool, if applicable. No additional management support is needed unless otherwise documented below in the visit note. 

## 2014-02-28 ENCOUNTER — Ambulatory Visit: Payer: Medicare Other | Attending: Orthopedic Surgery | Admitting: Rehabilitation

## 2014-02-28 DIAGNOSIS — Z952 Presence of prosthetic heart valve: Secondary | ICD-10-CM | POA: Diagnosis not present

## 2014-02-28 DIAGNOSIS — Z96651 Presence of right artificial knee joint: Secondary | ICD-10-CM | POA: Insufficient documentation

## 2014-02-28 DIAGNOSIS — M25661 Stiffness of right knee, not elsewhere classified: Secondary | ICD-10-CM | POA: Insufficient documentation

## 2014-02-28 DIAGNOSIS — M1990 Unspecified osteoarthritis, unspecified site: Secondary | ICD-10-CM | POA: Insufficient documentation

## 2014-02-28 DIAGNOSIS — M6281 Muscle weakness (generalized): Secondary | ICD-10-CM | POA: Insufficient documentation

## 2014-02-28 DIAGNOSIS — M25561 Pain in right knee: Secondary | ICD-10-CM | POA: Diagnosis not present

## 2014-02-28 DIAGNOSIS — R2689 Other abnormalities of gait and mobility: Secondary | ICD-10-CM | POA: Insufficient documentation

## 2014-02-28 DIAGNOSIS — Z950 Presence of cardiac pacemaker: Secondary | ICD-10-CM | POA: Insufficient documentation

## 2014-02-28 DIAGNOSIS — Z5189 Encounter for other specified aftercare: Secondary | ICD-10-CM | POA: Diagnosis not present

## 2014-02-28 LAB — URINE CULTURE: Colony Count: 70000

## 2014-03-01 ENCOUNTER — Other Ambulatory Visit: Payer: Self-pay | Admitting: Medical

## 2014-03-01 DIAGNOSIS — N390 Urinary tract infection, site not specified: Secondary | ICD-10-CM

## 2014-03-01 MED ORDER — CEPHALEXIN 500 MG PO CAPS: 500.0000 mg | ORAL_CAPSULE | Freq: Three times a day (TID) | ORAL | Status: DC

## 2014-03-04 ENCOUNTER — Encounter: Payer: Self-pay | Admitting: Internal Medicine

## 2014-03-04 ENCOUNTER — Ambulatory Visit (INDEPENDENT_AMBULATORY_CARE_PROVIDER_SITE_OTHER): Payer: 59 | Admitting: Internal Medicine

## 2014-03-04 VITALS — BP 140/60 | HR 72 | Ht 63.5 in | Wt 136.4 lb

## 2014-03-04 DIAGNOSIS — I4430 Unspecified atrioventricular block: Secondary | ICD-10-CM

## 2014-03-04 DIAGNOSIS — E785 Hyperlipidemia, unspecified: Secondary | ICD-10-CM

## 2014-03-04 DIAGNOSIS — I455 Other specified heart block: Secondary | ICD-10-CM

## 2014-03-04 DIAGNOSIS — I442 Atrioventricular block, complete: Secondary | ICD-10-CM

## 2014-03-04 LAB — MDC_IDC_ENUM_SESS_TYPE_INCLINIC
Battery Impedance: 110 Ohm
Battery Voltage: 2.8 V
Brady Statistic AP VP Percent: 19 %
Brady Statistic AP VS Percent: 0 %
Date Time Interrogation Session: 20151207144903
Lead Channel Impedance Value: 399 Ohm
Lead Channel Pacing Threshold Amplitude: 0.5 V
Lead Channel Pacing Threshold Amplitude: 0.5 V
Lead Channel Pacing Threshold Pulse Width: 0.4 ms
Lead Channel Setting Pacing Amplitude: 2.5 V
Lead Channel Setting Pacing Pulse Width: 0.4 ms
Lead Channel Setting Sensing Sensitivity: 2.8 mV
MDC IDC MSMT BATTERY REMAINING LONGEVITY: 146 mo
MDC IDC MSMT LEADCHNL RA PACING THRESHOLD PULSEWIDTH: 0.4 ms
MDC IDC MSMT LEADCHNL RA SENSING INTR AMPL: 2 mV
MDC IDC MSMT LEADCHNL RV IMPEDANCE VALUE: 676 Ohm
MDC IDC SET LEADCHNL RA PACING AMPLITUDE: 2 V
MDC IDC STAT BRADY AS VP PERCENT: 80 %
MDC IDC STAT BRADY AS VS PERCENT: 1 %

## 2014-03-04 NOTE — Progress Notes (Signed)
PCP: Annye Asa, MD Primary Cardiologist:  Dr Gilmer Mor at [facility] (primary)  Angel Waller is a 68 y.o. female who presents today for routine electrophysiology followup.  Since her last visit to our clinic, the patient reports doing very well.  Today, she denies symptoms of palpitations, chest pain, shortness of breath,  lower extremity edema, dizziness, presyncope, or syncope.  She is recovering from knee surgery and has a little swelling in her R leg which has not changed.  She denies calf pain.  The patient is otherwise without complaint today.   Past Medical History  Diagnosis Date  . Seasonal allergies   . Hyperlipemia   . S/P AVR (aortic valve replacement)     July 2010  . Migraines     "occasionally" (02/29/2012)  . Arthritis     "right knee" (02/29/2012)  . Complete heart block   . Pacemaker    Past Surgical History  Procedure Laterality Date  . Colonoscopy  2005    negative; [place], [place]  . Tonsillectomy and adenoidectomy  1962  . Cardiac valve replacement  2010    tissue aortic valve; [facility]  . Permanent pacemaker insertion  03/02/2012    Medtronic Adapta L implanted by Dr Rayann Heman  . Knee arthroscopy Right   . Insert / replace / remove pacemaker    . Total knee arthroplasty Right 01/07/2014    Procedure: RIGHT TOTAL KNEE ARTHROPLASTY;  Surgeon: Mauri Pole, MD;  Location: [facility];  Service: Orthopedics;  Laterality: Right;    Current Outpatient Prescriptions  Medication Sig Dispense Refill  . amoxicillin (AMOXIL) 500 MG capsule Take 2,000 mg by mouth as needed (before dental procedures.).     Marland Kitchen atorvastatin (LIPITOR) 40 MG tablet Take 40 mg by mouth at bedtime.    Raelyn Ensign Pollen 550 MG CAPS Take 2 tablets by mouth every morning.     . cephALEXin (KEFLEX) 500 MG capsule Take 1 capsule (500 mg total) by mouth 3 (three) times daily. 14 capsule 0  . Cholecalciferol (VITAMIN D3) 2000 UNITS TABS Take 1 tablet by mouth at bedtime.     . docusate sodium 100 MG CAPS  Take 100 mg by mouth 2 (two) times daily. 10 capsule 0  . estradiol (ESTRACE) 2 MG tablet Take 2 mg by mouth at bedtime.     . fluconazole (DIFLUCAN) 150 MG tablet Take 1 tablet (150 mg total) by mouth once. 1 tablet 0  . HYDROcodone-acetaminophen (NORCO) 7.5-325 MG per tablet Take 1-2 tablets by mouth every 4 (four) hours as needed for moderate pain. 100 tablet 0  . norethindrone (AYGESTIN) 5 MG tablet Take 2.5 mg by mouth. Once Every 4 months for 10 days    . omega-3 acid ethyl esters (LOVAZA) 1 G capsule Take 2 g by mouth at bedtime.    . polyethylene glycol (MIRALAX / GLYCOLAX) packet Take 17 g by mouth daily as needed for mild constipation. 14 each 0  . Probiotic Product (SOLUBLE FIBER/PROBIOTICS PO) Take 1 capsule by mouth at bedtime.     . tretinoin microspheres (RETIN-A MICRO) 0.1 % gel Apply 1 application topically at bedtime.      No current facility-administered medications for this visit.    Physical Exam: Filed Vitals:   03/04/14 0944  BP: 140/60  Pulse: 72  Height: 5' 3.5" (1.613 m)  Weight: 136 lb 6.4 oz (61.871 kg)    GEN- The patient is well appearing, alert and oriented x 3 today.  Head- normocephalic, atraumatic Eyes-  Sclera clear, conjunctiva pink Ears- hearing intact Oropharynx- clear Lungs- Clear to ausculation bilaterally, normal work of breathing Chest- pacemaker pocket is well healed Heart- Regular rate and rhythm,3/6 SEM LUSB with radiation to the carotids, 2/3 diastolic murmur LUSB GI- soft, NT, ND, + BS Extremities- no clubbing, cyanosis, +1 R leg swelling, no homans or cords  Pacemaker interrogation- reviewed in detail today,  See PACEART report  Assessment and Plan:  1. Complete heart block Normal pacemaker function See Pace Art report No changes today  2.  Aortic valve disease I referred her previously to Dr Stanford Breed.  Due to the complexity of her valve disease, she primarily follows with Dr Michaelle Birks.  3. HL Stable No change required  today   [facility] Return to the device clinic in 12 months

## 2014-03-04 NOTE — Patient Instructions (Signed)
Your physician wants you to follow-up in: 12 months with Dr. Vallery Ridge will receive a reminder letter in the mail two months in advance. If you don't receive a letter, please call our office to schedule the follow-up appointment.  Remote monitoring is used to monitor your Pacemaker or ICD from home. This monitoring reduces the number of office visits required to check your device to one time per year. It allows [place] to keep an eye on the functioning of your device to ensure it is working properly. You are scheduled for a device check from home on 06/03/14. You may send your transmission at any time that day. If you have a wireless device, the transmission will be sent automatically. After your physician reviews your transmission, you will receive a postcard with your next transmission date.

## 2014-03-05 ENCOUNTER — Ambulatory Visit: Payer: Medicare Other | Admitting: Rehabilitation

## 2014-03-07 ENCOUNTER — Encounter (HOSPITAL_COMMUNITY): Payer: Self-pay | Admitting: Cardiovascular Disease

## 2014-03-08 ENCOUNTER — Ambulatory Visit (INDEPENDENT_AMBULATORY_CARE_PROVIDER_SITE_OTHER): Payer: 59 | Admitting: Medical

## 2014-03-08 ENCOUNTER — Ambulatory Visit: Payer: Medicare Other | Admitting: Physical Therapy

## 2014-03-08 VITALS — BP 127/68 | HR 73 | Temp 97.6°F | Wt 135.4 lb

## 2014-03-08 DIAGNOSIS — R319 Hematuria, unspecified: Secondary | ICD-10-CM

## 2014-03-08 DIAGNOSIS — N390 Urinary tract infection, site not specified: Secondary | ICD-10-CM

## 2014-03-08 DIAGNOSIS — R82998 Other abnormal findings in urine: Secondary | ICD-10-CM | POA: Insufficient documentation

## 2014-03-08 LAB — POCT URINALYSIS DIPSTICK
BILIRUBIN UA: NEGATIVE
Glucose, UA: NEGATIVE
Ketones, UA: NEGATIVE
NITRITE UA: NEGATIVE
PH UA: 5.5
Spec Grav, UA: >=1.03
UROBILINOGEN UA: NEGATIVE

## 2014-03-08 LAB — URINALYSIS, MICROSCOPIC ONLY

## 2014-03-08 NOTE — Patient Instructions (Addendum)
Pt her for follow up on uti that had moderate blood. I wanted to repeat UA and make sure no blood present. If present on our ua then will send out for microscopic analysis.(Then based on that decide if further work up needed)  If no blood present then will not send out urine  and follow up  as needed regarding any future genitourinary type complaints.

## 2014-03-08 NOTE — Assessment & Plan Note (Signed)
Pt her for follow up on uti that had moderate blood. I wanted to repeat UA and make sure no blood present. If present on our ua then will send out for microscopic analysis.(Then based on that decide if further work up needed)  If no blood present then will not send out urine  and follow up  as needed regarding any future genitourinary type complaints.  Urine had some blood so sending out for microscopy and getting culture for leukocytes in urine.
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
## 2014-03-08 NOTE — Progress Notes (Signed)
Pre visit review using our clinic review tool, if applicable. No additional management support is needed unless otherwise documented below in the visit note. 

## 2014-03-08 NOTE — Progress Notes (Signed)
Subjective:     Patient ID: Angel Waller, female   DOB: [birth year], 68 y.o.   MRN: [id number]  HPI see below hpi   Review of Systems  see below ros Objective:  Physical Exam See below pd  Assessment:   Plan:   SEE plan belos

## 2014-03-09 ENCOUNTER — Telehealth: Payer: Self-pay | Admitting: Medical

## 2014-03-09 DIAGNOSIS — R319 Hematuria, unspecified: Secondary | ICD-10-CM

## 2014-03-09 NOTE — Telephone Encounter (Signed)
Blood in urine again and post uti treatment. Hx of smoking so will refer to urologist.

## 2014-03-10 LAB — URINE CULTURE
COLONY COUNT: NO GROWTH
ORGANISM ID, BACTERIA: NO GROWTH

## 2014-03-11 ENCOUNTER — Ambulatory Visit: Payer: Medicare Other | Admitting: Rehabilitation

## 2014-03-11 DIAGNOSIS — Z5189 Encounter for other specified aftercare: Secondary | ICD-10-CM | POA: Diagnosis not present

## 2014-03-11 NOTE — Telephone Encounter (Signed)
Pt has agreed to see a urologist.  Urology referral placed.

## 2014-03-14 ENCOUNTER — Ambulatory Visit: Payer: Medicare Other | Admitting: Physical Therapy

## 2014-03-18 ENCOUNTER — Emergency Department (HOSPITAL_BASED_OUTPATIENT_CLINIC_OR_DEPARTMENT_OTHER): Payer: Medicare Other

## 2014-03-18 ENCOUNTER — Emergency Department (HOSPITAL_BASED_OUTPATIENT_CLINIC_OR_DEPARTMENT_OTHER)
Admission: EM | Admit: 2014-03-18 | Discharge: 2014-03-19 | Disposition: A | Discharge status: Home / Self Care | Payer: Medicare Other | Attending: Emergency Medicine | Admitting: Emergency Medicine

## 2014-03-18 ENCOUNTER — Encounter (HOSPITAL_BASED_OUTPATIENT_CLINIC_OR_DEPARTMENT_OTHER): Payer: Self-pay | Admitting: *Deleted

## 2014-03-18 DIAGNOSIS — Y998 Other external cause status: Secondary | ICD-10-CM | POA: Diagnosis not present

## 2014-03-18 DIAGNOSIS — Z792 Long term (current) use of antibiotics: Secondary | ICD-10-CM | POA: Insufficient documentation

## 2014-03-18 DIAGNOSIS — S0990XA Unspecified injury of head, initial encounter: Secondary | ICD-10-CM | POA: Diagnosis not present

## 2014-03-18 DIAGNOSIS — Z87891 Personal history of nicotine dependence: Secondary | ICD-10-CM | POA: Insufficient documentation

## 2014-03-18 DIAGNOSIS — Z8679 Personal history of other diseases of the circulatory system: Secondary | ICD-10-CM | POA: Diagnosis not present

## 2014-03-18 DIAGNOSIS — Y9389 Activity, other specified: Secondary | ICD-10-CM | POA: Insufficient documentation

## 2014-03-18 DIAGNOSIS — M1990 Unspecified osteoarthritis, unspecified site: Secondary | ICD-10-CM | POA: Insufficient documentation

## 2014-03-18 DIAGNOSIS — E785 Hyperlipidemia, unspecified: Secondary | ICD-10-CM | POA: Diagnosis not present

## 2014-03-18 DIAGNOSIS — Z954 Presence of other heart-valve replacement: Secondary | ICD-10-CM | POA: Insufficient documentation

## 2014-03-18 DIAGNOSIS — S20219A Contusion of unspecified front wall of thorax, initial encounter: Secondary | ICD-10-CM

## 2014-03-18 DIAGNOSIS — Z950 Presence of cardiac pacemaker: Secondary | ICD-10-CM | POA: Insufficient documentation

## 2014-03-18 DIAGNOSIS — S20212A Contusion of left front wall of thorax, initial encounter: Secondary | ICD-10-CM | POA: Insufficient documentation

## 2014-03-18 DIAGNOSIS — Y92410 Unspecified street and highway as the place of occurrence of the external cause: Secondary | ICD-10-CM | POA: Diagnosis not present

## 2014-03-18 DIAGNOSIS — S299XXA Unspecified injury of thorax, initial encounter: Secondary | ICD-10-CM | POA: Diagnosis present

## 2014-03-18 MED ORDER — HYDROCODONE-ACETAMINOPHEN 5-325 MG PO TABS
1.0000 | ORAL_TABLET | Freq: Once | ORAL | Status: AC
  Administered 2014-03-18: 1 via ORAL
  Filled 2014-03-18: qty 1

## 2014-03-18 MED ORDER — HYDROCODONE-ACETAMINOPHEN 5-325 MG PO TABS: 1.0000 | ORAL_TABLET | Freq: Four times a day (QID) | ORAL | Status: DC | PRN

## 2014-03-18 NOTE — Discharge Instructions (Signed)
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
## 2014-03-19 ENCOUNTER — Ambulatory Visit: Payer: Medicare Other | Admitting: Rehabilitation

## 2014-03-19 DIAGNOSIS — Z5189 Encounter for other specified aftercare: Secondary | ICD-10-CM | POA: Diagnosis not present

## 2014-03-21 ENCOUNTER — Ambulatory Visit: Payer: Medicare Other

## 2014-03-21 DIAGNOSIS — Z5189 Encounter for other specified aftercare: Secondary | ICD-10-CM | POA: Diagnosis not present

## 2014-03-25 ENCOUNTER — Other Ambulatory Visit: Payer: Self-pay | Admitting: Family Medicine

## 2014-03-25 NOTE — Telephone Encounter (Signed)
atorvastatin (LIPITOR) 40 MG tablet [[id number]]    Dose, Route, Frequency: As Directed    Dispense Quantity:  30 tablet Refills:  6 Fills Remaining:  6 12/24/2013         Sig: TAKE 1 TABLET BY MOUTH EVERY DAY   Ordering Provider:  Midge Minium, MD Authorizing Provider:  Midge Minium, MD Ordering User:  Kris Hartmann, CMA             Denied--Too Soon for request/SLS

## 2014-03-26 ENCOUNTER — Ambulatory Visit: Payer: Medicare Other | Admitting: Rehabilitation

## 2014-03-26 DIAGNOSIS — Z5189 Encounter for other specified aftercare: Secondary | ICD-10-CM | POA: Diagnosis not present

## 2014-03-28 ENCOUNTER — Ambulatory Visit: Payer: Medicare Other | Admitting: Rehabilitation

## 2014-03-28 DIAGNOSIS — Z5189 Encounter for other specified aftercare: Secondary | ICD-10-CM | POA: Diagnosis not present

## 2014-04-01 ENCOUNTER — Other Ambulatory Visit: Payer: Self-pay | Admitting: Family Medicine

## 2014-04-01 ENCOUNTER — Encounter: Payer: Self-pay | Admitting: Family Medicine

## 2014-04-01 ENCOUNTER — Ambulatory Visit: Payer: Medicare Other | Attending: Medical

## 2014-04-01 ENCOUNTER — Ambulatory Visit (INDEPENDENT_AMBULATORY_CARE_PROVIDER_SITE_OTHER): Payer: 59 | Admitting: Family Medicine

## 2014-04-01 VITALS — BP 123/75 | HR 79 | Temp 97.7°F | Resp 16 | Wt 138.4 lb

## 2014-04-01 DIAGNOSIS — Z96651 Presence of right artificial knee joint: Secondary | ICD-10-CM | POA: Diagnosis not present

## 2014-04-01 DIAGNOSIS — M1990 Unspecified osteoarthritis, unspecified site: Secondary | ICD-10-CM | POA: Insufficient documentation

## 2014-04-01 DIAGNOSIS — R2689 Other abnormalities of gait and mobility: Secondary | ICD-10-CM | POA: Diagnosis not present

## 2014-04-01 DIAGNOSIS — M25661 Stiffness of right knee, not elsewhere classified: Secondary | ICD-10-CM | POA: Diagnosis not present

## 2014-04-01 DIAGNOSIS — Z5189 Encounter for other specified aftercare: Secondary | ICD-10-CM | POA: Insufficient documentation

## 2014-04-01 DIAGNOSIS — M25561 Pain in right knee: Secondary | ICD-10-CM | POA: Diagnosis not present

## 2014-04-01 DIAGNOSIS — M6281 Muscle weakness (generalized): Secondary | ICD-10-CM | POA: Insufficient documentation

## 2014-04-01 DIAGNOSIS — Z952 Presence of prosthetic heart valve: Secondary | ICD-10-CM | POA: Insufficient documentation

## 2014-04-01 DIAGNOSIS — S2000XA Contusion of breast, unspecified breast, initial encounter: Secondary | ICD-10-CM | POA: Insufficient documentation

## 2014-04-01 DIAGNOSIS — S2002XA Contusion of left breast, initial encounter: Secondary | ICD-10-CM

## 2014-04-01 DIAGNOSIS — Z950 Presence of cardiac pacemaker: Secondary | ICD-10-CM | POA: Insufficient documentation

## 2014-04-01 DIAGNOSIS — S93402A Sprain of unspecified ligament of left ankle, initial encounter: Secondary | ICD-10-CM | POA: Insufficient documentation

## 2014-04-01 MED ORDER — HYDROCODONE-ACETAMINOPHEN 5-325 MG PO TABS: 1.0000 | ORAL_TABLET | Freq: Four times a day (QID) | ORAL | Status: DC | PRN

## 2014-04-01 NOTE — Progress Notes (Signed)
Pre visit review using our clinic review tool, if applicable. No additional management support is needed unless otherwise documented below in the visit note. 
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
## 2014-04-01 NOTE — Patient Instructions (Signed)
Follow up in 2 weeks to recheck bruising Continue to alternate ice and heat for the bruising Use the Hydrocodone for pain Ibuprofen as needed for pain/inflammation Wear good supportive shoes, ice, elevate for the sprain If you develop redness, increased pain, or other concerns- please call me! Call with any questions or concerns Happy New Year- it has to get better!!

## 2014-04-01 NOTE — Telephone Encounter (Signed)
Med filled.  

## 2014-04-02 ENCOUNTER — Encounter: Payer: Self-pay | Admitting: Family Medicine

## 2014-04-03 ENCOUNTER — Ambulatory Visit: Payer: Medicare Other

## 2014-04-03 DIAGNOSIS — Z5189 Encounter for other specified aftercare: Secondary | ICD-10-CM | POA: Diagnosis not present

## 2014-04-04 ENCOUNTER — Ambulatory Visit: Payer: Medicare Other | Admitting: Rehabilitation

## 2014-04-05 ENCOUNTER — Telehealth: Payer: Self-pay | Admitting: Family Medicine

## 2014-04-05 NOTE — Telephone Encounter (Signed)
Yes this would be fine
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
## 2014-04-07 NOTE — Assessment & Plan Note (Addendum)
New.  Pt's bruising and swelling of L breast is extensive and severe.  Pt is in a lot of pain.  Pain meds refilled.  Reviewed risk and sxs of infection w/ hematoma.  Reviewed supportive care and red flags that should prompt return.  Pt expressed understanding and is in agreement w/ plan.

## 2014-04-07 NOTE — Assessment & Plan Note (Signed)
New.  Pt w/ visible bruising but ROM WNL.  No joint line tenderness or apparent disruption of TKR.  Encouraged ice, NSAIDs.
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
## 2014-04-08 ENCOUNTER — Ambulatory Visit: Payer: Medicare Other

## 2014-04-08 DIAGNOSIS — Z5189 Encounter for other specified aftercare: Secondary | ICD-10-CM | POA: Diagnosis not present

## 2014-04-10 ENCOUNTER — Telehealth: Payer: Self-pay | Admitting: Internal Medicine
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
## 2014-04-11 ENCOUNTER — Ambulatory Visit: Payer: Medicare Other | Admitting: Rehabilitation

## 2014-04-11 DIAGNOSIS — Z5189 Encounter for other specified aftercare: Secondary | ICD-10-CM | POA: Diagnosis not present

## 2014-04-12 NOTE — Telephone Encounter (Signed)
Remote received. All diagnostics normal. Pt aware. Follow up as planned.

## 2014-04-17 NOTE — Telephone Encounter (Signed)
I contacted the mother and informed her to call us to make appt for daughter.

## 2014-04-22 ENCOUNTER — Encounter: Payer: Self-pay | Admitting: Family Medicine

## 2014-04-22 ENCOUNTER — Ambulatory Visit (INDEPENDENT_AMBULATORY_CARE_PROVIDER_SITE_OTHER): Payer: 59 | Admitting: Family Medicine

## 2014-04-22 VITALS — BP 120/74 | HR 76 | Temp 98.0°F | Resp 16 | Wt 132.5 lb

## 2014-04-22 DIAGNOSIS — S2002XD Contusion of left breast, subsequent encounter: Secondary | ICD-10-CM

## 2014-04-22 MED ORDER — HYDROCODONE-ACETAMINOPHEN 5-325 MG PO TABS: 1.0000 | ORAL_TABLET | Freq: Four times a day (QID) | ORAL | Status: DC | PRN
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
## 2014-04-22 NOTE — Progress Notes (Signed)
Pre visit review using our clinic review tool, if applicable. No additional management support is needed unless otherwise documented below in the visit note. 

## 2014-04-22 NOTE — Assessment & Plan Note (Signed)
Bruising is improving but pt still w/ hard lump on L breast.  Likely due to hematoma or fat necrosis but will [place] to be sure.  Pt expressed understanding and is in agreement w/ plan.  Refill on hydrocodone provided.

## 2014-05-07 ENCOUNTER — Other Ambulatory Visit: Payer: Self-pay | Admitting: Family Medicine

## 2014-05-07 DIAGNOSIS — S2002XD Contusion of left breast, subsequent encounter: Secondary | ICD-10-CM

## 2014-05-08 DIAGNOSIS — Z953 Presence of xenogenic heart valve: Secondary | ICD-10-CM | POA: Insufficient documentation

## 2014-05-24 ENCOUNTER — Other Ambulatory Visit: Payer: Self-pay

## 2014-05-24 ENCOUNTER — Other Ambulatory Visit: Payer: Self-pay | Admitting: Family Medicine

## 2014-05-24 DIAGNOSIS — S2002XD Contusion of left breast, subsequent encounter: Secondary | ICD-10-CM

## 2014-05-27 ENCOUNTER — Other Ambulatory Visit: Payer: 59

## 2014-05-31 ENCOUNTER — Ambulatory Visit
Admission: RE | Admit: 2014-05-31 | Discharge: 2014-05-31 | Disposition: A | Discharge status: Home / Self Care | Payer: Medicare Other | Source: Ambulatory Visit | Attending: Family Medicine | Admitting: Family Medicine

## 2014-05-31 DIAGNOSIS — S2002XD Contusion of left breast, subsequent encounter: Secondary | ICD-10-CM

## 2014-06-03 ENCOUNTER — Ambulatory Visit (INDEPENDENT_AMBULATORY_CARE_PROVIDER_SITE_OTHER): Payer: Medicare Other | Admitting: *Deleted

## 2014-06-03 DIAGNOSIS — I442 Atrioventricular block, complete: Secondary | ICD-10-CM

## 2014-06-03 NOTE — Progress Notes (Signed)
Remote pacemaker transmission.   

## 2014-06-05 LAB — MDC_IDC_ENUM_SESS_TYPE_REMOTE
Battery Impedance: 134 Ohm
Battery Remaining Longevity: 129 mo
Battery Voltage: 2.8 V
Brady Statistic AP VP Percent: 9 %
Brady Statistic AP VS Percent: 0 %
Brady Statistic AS VS Percent: 0 %
Date Time Interrogation Session: 20160307125817
Lead Channel Impedance Value: 645 Ohm
Lead Channel Pacing Threshold Pulse Width: 0.4 ms
Lead Channel Setting Pacing Amplitude: 2 V
Lead Channel Setting Pacing Pulse Width: 0.4 ms
MDC IDC MSMT LEADCHNL RA IMPEDANCE VALUE: 389 Ohm
MDC IDC MSMT LEADCHNL RA PACING THRESHOLD AMPLITUDE: 0.5 V
MDC IDC MSMT LEADCHNL RA SENSING INTR AMPL: 1 mV
MDC IDC MSMT LEADCHNL RV PACING THRESHOLD AMPLITUDE: 0.375 V
MDC IDC MSMT LEADCHNL RV PACING THRESHOLD PULSEWIDTH: 0.4 ms
MDC IDC SET LEADCHNL RV PACING AMPLITUDE: 2.5 V
MDC IDC SET LEADCHNL RV SENSING SENSITIVITY: 2.8 mV
MDC IDC STAT BRADY AS VP PERCENT: 91 %

## 2014-06-10 ENCOUNTER — Encounter: Payer: Self-pay | Admitting: Cardiology

## 2014-06-13 ENCOUNTER — Telehealth: Payer: Self-pay | Admitting: Family Medicine

## 2014-06-13 NOTE — Telephone Encounter (Signed)
CPE letter sent °

## 2014-06-17 ENCOUNTER — Encounter: Payer: Self-pay | Admitting: Internal Medicine

## 2014-06-19 ENCOUNTER — Encounter: Payer: 59 | Admitting: Family Medicine

## 2014-07-01 ENCOUNTER — Ambulatory Visit (INDEPENDENT_AMBULATORY_CARE_PROVIDER_SITE_OTHER): Payer: Medicare Other | Admitting: Family Medicine

## 2014-07-01 ENCOUNTER — Encounter: Payer: Self-pay | Admitting: Family Medicine

## 2014-07-01 VITALS — BP 118/76 | HR 69 | Temp 98.1°F | Resp 16 | Ht 63.75 in | Wt 132.2 lb

## 2014-07-01 DIAGNOSIS — Z0000 Encounter for general adult medical examination without abnormal findings: Secondary | ICD-10-CM

## 2014-07-01 DIAGNOSIS — Z23 Encounter for immunization: Secondary | ICD-10-CM

## 2014-07-01 DIAGNOSIS — Z952 Presence of prosthetic heart valve: Secondary | ICD-10-CM

## 2014-07-01 DIAGNOSIS — Z954 Presence of other heart-valve replacement: Secondary | ICD-10-CM | POA: Diagnosis not present

## 2014-07-01 DIAGNOSIS — E785 Hyperlipidemia, unspecified: Secondary | ICD-10-CM

## 2014-07-01 LAB — LIPID PANEL
CHOLESTEROL: 155 mg/dL (ref 0–200)
HDL: 52.4 mg/dL (ref >39.00)
LDL Cholesterol: 87 mg/dL (ref 0–99)
NonHDL: 102.6
TRIGLYCERIDES: 78 mg/dL (ref 0.0–149.0)
Total CHOL/HDL Ratio: 3
VLDL: 15.6 mg/dL (ref 0.0–40.0)

## 2014-07-01 LAB — CBC WITH DIFFERENTIAL/PLATELET
BASOS PCT: 0.5 % (ref 0.0–3.0)
Basophils Absolute: 0 10*3/uL (ref 0.0–0.1)
EOS PCT: 3.7 % (ref 0.0–5.0)
Eosinophils Absolute: 0.2 10*3/uL (ref 0.0–0.7)
HEMATOCRIT: 39.4 % (ref 36.0–46.0)
Hemoglobin: 13.3 g/dL (ref 12.0–15.0)
LYMPHS ABS: 1.9 10*3/uL (ref 0.7–4.0)
Lymphocytes Relative: 32.7 % (ref 12.0–46.0)
MCHC: 33.7 g/dL (ref 30.0–36.0)
MCV: 91.1 fl (ref 78.0–100.0)
MONO ABS: 0.4 10*3/uL (ref 0.1–1.0)
MONOS PCT: 6.4 % (ref 3.0–12.0)
Neutro Abs: 3.3 10*3/uL (ref 1.4–7.7)
Neutrophils Relative %: 56.7 % (ref 43.0–77.0)
PLATELETS: 167 10*3/uL (ref 150.0–400.0)
RBC: 4.33 Mil/uL (ref 3.87–5.11)
RDW: 13.9 % (ref 11.5–15.5)
WBC: 5.8 10*3/uL (ref 4.0–10.5)

## 2014-07-01 LAB — BASIC METABOLIC PANEL
BUN: 18 mg/dL (ref 6–23)
CALCIUM: 9.3 mg/dL (ref 8.4–10.5)
CO2: 28 mEq/L (ref 19–32)
Chloride: 106 mEq/L (ref 96–112)
Creatinine, Ser: 0.68 mg/dL (ref 0.40–1.20)
GFR: 91.07 mL/min (ref >60.00)
Glucose, Bld: 77 mg/dL (ref 70–99)
Potassium: 3.8 mEq/L (ref 3.5–5.1)
SODIUM: 139 meq/L (ref 135–145)

## 2014-07-01 LAB — HEPATIC FUNCTION PANEL
ALK PHOS: 50 U/L (ref 39–117)
ALT: 12 U/L (ref 0–35)
AST: 20 U/L (ref 0–37)
Albumin: 3.9 g/dL (ref 3.5–5.2)
Bilirubin, Direct: 0.1 mg/dL (ref 0.0–0.3)
Total Bilirubin: 0.6 mg/dL (ref 0.2–1.2)
Total Protein: 6.8 g/dL (ref 6.0–8.3)

## 2014-07-01 LAB — TSH: TSH: 1.91 u[IU]/mL (ref 0.35–4.50)

## 2014-07-01 MED ORDER — FLUCONAZOLE 150 MG PO TABS: 150.0000 mg | ORAL_TABLET | Freq: Once | ORAL | Status: DC

## 2014-07-01 MED ORDER — ATORVASTATIN CALCIUM 40 MG PO TABS: 40.0000 mg | ORAL_TABLET | Freq: Every day | ORAL | Status: DC

## 2014-07-01 NOTE — Assessment & Plan Note (Signed)
Chronic problem.  Tolerating statin w/o difficulty.  Check labs.  Adjust meds prn  

## 2014-07-01 NOTE — Assessment & Plan Note (Signed)
Pt's PE WNL w/ exception of known aortic murmur and mechanical valve click.  UTD on colonoscopy, has GYN appt scheduled.  Written screening schedule updated and given to pt.  prevnar updated today.  Check labs.  Anticipatory guidance provided.

## 2014-07-01 NOTE — Patient Instructions (Signed)
Follow up in 6 months to recheck cholesterol We'll notify you of your lab results and make any changes if needed Keep up the good work on healthy diet and regular exercise- you look great! Call with any questions or concerns Happy Spring!! 

## 2014-07-01 NOTE — Assessment & Plan Note (Signed)
Chronic problem.  Pt continues to follow w/ Dr Michaelle Birks at [facility].  Reviewed his most recent note.  Pt has large valvular leak and they are worried about hemolysis.  Will follow along and assist as able.

## 2014-07-01 NOTE — Progress Notes (Signed)
Pre visit review using our clinic review tool, if applicable. No additional management support is needed unless otherwise documented below in the visit note. 

## 2014-07-01 NOTE — Progress Notes (Signed)
   Subjective:    Patient ID: Angel Waller, female    DOB: May 03, 1945, 69 y.o.   MRN: [id number]  HPI Here today for CPE.  Risk Factors: Hyperlipidemia- chronic problem, on lipitor daily. Physical Activity: exercising regularly[facility] Risk: low Depression: denies current sxs Hearing: normal to conversational tones and whispered voice at 6 ft ADL's: independent Cognitive: normal linear thought process, memory and attention intact Home Safety: safe at home Height, Weight, BMI, Visual Acuity: see vitals, vision corrected to 20/20 w/ glasses Counseling: UTD on colonoscopy, mammo, has pap and DEXA scheduled w/ GYN ([facility]) Labs Ordered: See A&P Care Plan: See A&P    Review of Systems Patient reports no vision/ hearing changes, adenopathy,fever, weight change,  persistant/recurrent hoarseness , swallowing issues, chest pain, palpitations, edema, persistant/recurrent cough, hemoptysis, dyspnea (rest/exertional/paroxysmal nocturnal), gastrointestinal bleeding (melena, rectal bleeding), abdominal pain, significant heartburn, bowel changes, GU symptoms (dysuria, hematuria, incontinence), Gyn symptoms (abnormal  bleeding, pain),  syncope, focal weakness, memory loss, numbness & tingling, skin/hair/nail changes, abnormal bruising or bleeding, anxiety, or depression.     Objective:   Physical Exam General Appearance:    Alert, cooperative, no distress, appears stated age  Head:    Normocephalic, without obvious abnormality, atraumatic  Eyes:    PERRL, conjunctiva/corneas clear, EOM's intact, fundi    benign, both eyes  Ears:    Normal TM's and external ear canals, both ears  Nose:   Nares normal, septum midline, mucosa normal, no drainage    or sinus tenderness  Throat:   Lips, mucosa, and tongue normal; teeth and gums normal  Neck:   Supple, symmetrical, trachea midline, no adenopathy;    Thyroid: no enlargement/tenderness/nodules  Back:     Symmetric, no curvature, ROM normal,  no CVA tenderness  Lungs:     Clear to auscultation bilaterally, respirations unlabored  Chest Wall:    No tenderness or deformity   Heart:    Regular rate and rhythm, mechanical valve click w/ III/VI SEM  Breast Exam:    Deferred to GYN  Abdomen:     Soft, non-tender, bowel sounds active all four quadrants,    no masses, no organomegaly  Genitalia:    Deferred to GYN  Rectal:    Extremities:   Extremities normal, atraumatic, no cyanosis or edema  Pulses:   2+ and symmetric all extremities  Skin:   Skin color, texture, turgor normal, no rashes or lesions  Lymph nodes:   Cervical, supraclavicular, and axillary nodes normal  Neurologic:   CNII-XII intact, normal strength, sensation and reflexes    throughout          Assessment & Plan:

## 2014-07-28 ENCOUNTER — Other Ambulatory Visit: Payer: Self-pay | Admitting: Family Medicine

## 2014-07-28 LAB — HM PAP SMEAR

## 2014-07-29 NOTE — Telephone Encounter (Signed)
Med filled.  

## 2014-09-02 ENCOUNTER — Ambulatory Visit (INDEPENDENT_AMBULATORY_CARE_PROVIDER_SITE_OTHER): Payer: Medicare Other | Admitting: *Deleted

## 2014-09-02 ENCOUNTER — Telehealth: Payer: Self-pay | Admitting: Cardiology

## 2014-09-02 DIAGNOSIS — I442 Atrioventricular block, complete: Secondary | ICD-10-CM

## 2014-09-02 NOTE — Telephone Encounter (Signed)
Spoke with pt and reminded pt of remote transmission that is due today. Pt verbalized understanding.   

## 2014-09-03 NOTE — Progress Notes (Signed)
Remote pacemaker transmission.   

## 2014-09-07 LAB — CUP PACEART REMOTE DEVICE CHECK
Battery Impedance: 134 Ohm
Battery Remaining Longevity: 129 mo
Battery Voltage: 2.8 V
Brady Statistic AP VS Percent: 0 %
Brady Statistic AS VP Percent: 87 %
Date Time Interrogation Session: 20160606182213
Lead Channel Impedance Value: 390 Ohm
Lead Channel Pacing Threshold Amplitude: 0.375 V
Lead Channel Pacing Threshold Pulse Width: 0.4 ms
Lead Channel Pacing Threshold Pulse Width: 0.4 ms
Lead Channel Sensing Intrinsic Amplitude: 1 mV
Lead Channel Setting Pacing Amplitude: 2 V
Lead Channel Setting Pacing Pulse Width: 0.4 ms
Lead Channel Setting Sensing Sensitivity: 2.8 mV
MDC IDC MSMT LEADCHNL RA PACING THRESHOLD AMPLITUDE: 0.5 V
MDC IDC MSMT LEADCHNL RV IMPEDANCE VALUE: 628 Ohm
MDC IDC SET LEADCHNL RV PACING AMPLITUDE: 2.5 V
MDC IDC STAT BRADY AP VP PERCENT: 13 %
MDC IDC STAT BRADY AS VS PERCENT: 0 %

## 2014-09-16 ENCOUNTER — Encounter: Payer: Self-pay | Admitting: Cardiology

## 2014-09-20 ENCOUNTER — Encounter: Payer: Self-pay | Admitting: Internal Medicine

## 2014-10-04 ENCOUNTER — Telehealth: Payer: Self-pay | Admitting: *Deleted

## 2014-10-04 NOTE — Telephone Encounter (Signed)
Pt signed ROI received via fax from [employer] [facility] [place]. Forwarded to [place] to scan/email to medical records. JG//CMA

## 2014-11-21 ENCOUNTER — Ambulatory Visit (INDEPENDENT_AMBULATORY_CARE_PROVIDER_SITE_OTHER): Payer: Medicare Other | Admitting: Medical

## 2014-11-21 ENCOUNTER — Encounter: Payer: Self-pay | Admitting: Medical

## 2014-11-21 VITALS — BP 116/74 | HR 87 | Temp 98.7°F | Resp 16 | Ht 63.75 in | Wt 135.8 lb

## 2014-11-21 DIAGNOSIS — H6122 Impacted cerumen, left ear: Secondary | ICD-10-CM

## 2014-11-21 NOTE — Progress Notes (Signed)
Pre visit review using our clinic review tool, if applicable. No additional management support is needed unless otherwise documented below in the visit note. 

## 2014-11-21 NOTE — Patient Instructions (Addendum)
Cerumen impaction cleared partially by lavage by lpn and I removed remaining portion with curette..   If re-occurs in future then soften wax as you did this time and can wash out again.  Follow up as needed
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
## 2014-12-03 ENCOUNTER — Ambulatory Visit (INDEPENDENT_AMBULATORY_CARE_PROVIDER_SITE_OTHER): Payer: Medicare Other | Admitting: *Deleted

## 2014-12-03 ENCOUNTER — Encounter: Payer: Self-pay | Admitting: Internal Medicine

## 2014-12-03 ENCOUNTER — Telehealth: Payer: Self-pay | Admitting: Internal Medicine

## 2014-12-03 DIAGNOSIS — I442 Atrioventricular block, complete: Secondary | ICD-10-CM

## 2014-12-03 NOTE — Telephone Encounter (Signed)
Pt made aware 12/02/14 transmission was received and she doesn't need to send one today.

## 2014-12-03 NOTE — Telephone Encounter (Signed)
New Message  Pt called to see if remote check was necessary today, since she sent in a signal yesterday 9/5. Please call back and discuss.

## 2014-12-06 NOTE — Progress Notes (Signed)
Remote pacemaker transmission.   

## 2014-12-13 LAB — CUP PACEART REMOTE DEVICE CHECK
Brady Statistic AP VP Percent: 17.3 %
Brady Statistic AS VP Percent: 82.7 %
Date Time Interrogation Session: 20160916091414
Lead Channel Impedance Value: 674 Ohm
Lead Channel Pacing Threshold Amplitude: 0.375 V
Lead Channel Pacing Threshold Amplitude: 0.5 V
Lead Channel Pacing Threshold Pulse Width: 0.4 ms
Lead Channel Sensing Intrinsic Amplitude: 1 mV
Lead Channel Setting Pacing Pulse Width: 0.4 ms
MDC IDC MSMT LEADCHNL RA IMPEDANCE VALUE: 405 Ohm
MDC IDC MSMT LEADCHNL RV PACING THRESHOLD PULSEWIDTH: 0.4 ms
MDC IDC SET LEADCHNL RA PACING AMPLITUDE: 2 V
MDC IDC SET LEADCHNL RV PACING AMPLITUDE: 2.5 V
MDC IDC SET LEADCHNL RV SENSING SENSITIVITY: 2.8 mV
MDC IDC STAT BRADY AP VS PERCENT: 0.1 % — AB
MDC IDC STAT BRADY AS VS PERCENT: 0.1 % — AB

## 2014-12-16 ENCOUNTER — Encounter: Payer: Self-pay | Admitting: *Deleted

## 2014-12-17 ENCOUNTER — Encounter: Payer: Self-pay | Admitting: Cardiology

## 2014-12-31 ENCOUNTER — Ambulatory Visit (INDEPENDENT_AMBULATORY_CARE_PROVIDER_SITE_OTHER): Payer: Medicare Other | Admitting: Family Medicine

## 2014-12-31 ENCOUNTER — Encounter: Payer: Self-pay | Admitting: Family Medicine

## 2014-12-31 VITALS — BP 116/74 | HR 72 | Temp 98.0°F | Resp 16 | Ht 64.0 in | Wt 135.2 lb

## 2014-12-31 DIAGNOSIS — E785 Hyperlipidemia, unspecified: Secondary | ICD-10-CM

## 2014-12-31 DIAGNOSIS — Z23 Encounter for immunization: Secondary | ICD-10-CM | POA: Diagnosis not present

## 2014-12-31 LAB — HEPATIC FUNCTION PANEL
ALK PHOS: 46 U/L (ref 39–117)
ALT: 11 U/L (ref 0–35)
AST: 22 U/L (ref 0–37)
Albumin: 3.8 g/dL (ref 3.5–5.2)
BILIRUBIN TOTAL: 0.6 mg/dL (ref 0.2–1.2)
Bilirubin, Direct: 0.1 mg/dL (ref 0.0–0.3)
Total Protein: 6.8 g/dL (ref 6.0–8.3)

## 2014-12-31 LAB — LIPID PANEL
Cholesterol: 153 mg/dL (ref 0–200)
HDL: 52.8 mg/dL (ref >39.00)
LDL Cholesterol: 82 mg/dL (ref 0–99)
NONHDL: 99.81
Total CHOL/HDL Ratio: 3
Triglycerides: 90 mg/dL (ref 0.0–149.0)
VLDL: 18 mg/dL (ref 0.0–40.0)

## 2014-12-31 NOTE — Assessment & Plan Note (Signed)
Chronic problem.  Pt is tolerating statin w/o difficulty.  Stressed need for healthy diet and regular exercise.  Check labs.  Adjust meds prn

## 2014-12-31 NOTE — Progress Notes (Signed)
Pre visit review using our clinic review tool, if applicable. No additional management support is needed unless otherwise documented below in the visit note. 

## 2014-12-31 NOTE — Progress Notes (Signed)
   Subjective:    Patient ID: Angel Waller, female    DOB: 19-Apr-1945, 70 y.o.   MRN: [id number]  HPI Hyperlipidemia- chronic problem, on lipitor.  Pt was exercising regularly until summer and then stopped due to excessive heat.  Plans to resume.  No CP, SOB, HAs, visual changes, abd pain, N/V, myalgias.   Review of Systems For ROS see HPI     Objective:   Physical Exam  Constitutional: She is oriented to person, place, and time. She appears well-developed and well-nourished. No distress.  HENT:  Head: Normocephalic and atraumatic.  Eyes: Conjunctivae and EOM are normal. Pupils are equal, round, and reactive to light.  Neck: Normal range of motion. Neck supple. No thyromegaly present.  Cardiovascular: Normal rate, regular rhythm and intact distal pulses.   Murmur (III/VI SEM at upper sternal border) heard. Pulmonary/Chest: Effort normal and breath sounds normal. No respiratory distress.  Abdominal: Soft. She exhibits no distension. There is no tenderness.  Musculoskeletal: She exhibits no edema.  Lymphadenopathy:    She has no cervical adenopathy.  Neurological: She is alert and oriented to person, place, and time.  Skin: Skin is warm and dry.  Psychiatric: She has a normal mood and affect. Her behavior is normal.  Vitals reviewed.         Assessment & Plan:

## 2014-12-31 NOTE — Patient Instructions (Signed)
Schedule your complete physical in 6 months We'll notify you of your lab results and make any changes if needed Keep up the good work on healthy diet and regular exercise- you look great! Call with any questions or concerns If you want to join us at the new [place] office, any scheduled appointments will automatically transfer and we will see you at [address], [place], [place] [address]  Happy Fall!!! 
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
## 2015-02-25 ENCOUNTER — Other Ambulatory Visit: Payer: Self-pay | Admitting: Family Medicine

## 2015-02-25 NOTE — Telephone Encounter (Signed)
Medication filled to pharmacy as requested.   

## 2015-03-05 ENCOUNTER — Encounter: Payer: Self-pay | Admitting: Internal Medicine

## 2015-03-05 ENCOUNTER — Ambulatory Visit (INDEPENDENT_AMBULATORY_CARE_PROVIDER_SITE_OTHER): Payer: Medicare Other | Admitting: Internal Medicine

## 2015-03-05 VITALS — BP 138/54 | HR 64 | Ht 64.0 in | Wt 135.0 lb

## 2015-03-05 DIAGNOSIS — I455 Other specified heart block: Secondary | ICD-10-CM | POA: Diagnosis not present

## 2015-03-05 DIAGNOSIS — Z950 Presence of cardiac pacemaker: Secondary | ICD-10-CM

## 2015-03-05 DIAGNOSIS — I442 Atrioventricular block, complete: Secondary | ICD-10-CM

## 2015-03-05 NOTE — Patient Instructions (Signed)
Medication Instructions:  Your physician recommends that you continue on your current medications as directed. Please refer to the Current Medication list given to you today.   Labwork: None ordered   Testing/Procedures: None ordered   Follow-Up: Remote monitoring is used to monitor your Pacemaker  from home. This monitoring reduces the number of office visits required to check your device to one time per year. It allows [place] to keep an eye on the functioning of your device to ensure it is working properly. You are scheduled for a device check from home on 06/04/15. You may send your transmission at any time that day. If you have a wireless device, the transmission will be sent automatically. After your physician reviews your transmission, you will receive a postcard with your next transmission date.   Your physician wants you to follow-up in: 12 months with [place] will receive a reminder letter in the mail two months in advance. If you don't receive a letter, please call our office to schedule the follow-up appointment.   Any Other Special Instructions Will Be Listed Below (If Applicable).     If you need a refill on your cardiac medications before your next appointment, please call your pharmacy.

## 2015-03-05 NOTE — Progress Notes (Signed)
PCP: Annye Asa, MD Primary Cardiologist:  Dr Gilmer Mor at [facility] (primary)  Angel Waller is a 69 y.o. female who presents today for routine electrophysiology followup.  Since her last visit to our clinic, the patient reports doing very well.  Today, she denies symptoms of   chest pain, shortness of breath,  lower extremity edema, dizziness, presyncope, or syncope.     The patient is otherwise without complaint today.   Past Medical History  Diagnosis Date  . Seasonal allergies   . Hyperlipemia   . S/P AVR (aortic valve replacement)     July 2010  . Migraines     "occasionally" (02/29/2012)  . Arthritis     "right knee" (02/29/2012)  . Complete heart block ([place])   . Pacemaker    Past Surgical History  Procedure Laterality Date  . Colonoscopy  2005    negative; [place], [place]  . Tonsillectomy and adenoidectomy  1962  . Cardiac valve replacement  2010    tissue aortic valve; [facility]  . Permanent pacemaker insertion  03/02/2012    Medtronic Adapta L implanted by Dr Rayann Heman  . Knee arthroscopy Right   . Insert / replace / remove pacemaker    . Total knee arthroplasty Right 01/07/2014    Procedure: RIGHT TOTAL KNEE ARTHROPLASTY;  Surgeon: Mauri Pole, MD;  Location: [facility];  Service: Orthopedics;  Laterality: Right;  . Temporary pacemaker insertion N/A 03/01/2012    Procedure: TEMPORARY PACEMAKER INSERTION;  Surgeon: Burnell Blanks, MD;  Location: [facility] CATH LAB;  Service: Cardiovascular;  Laterality: N/A;  . Permanent pacemaker insertion N/A 03/02/2012    Procedure: PERMANENT PACEMAKER INSERTION;  Surgeon: Thompson Grayer, MD;  Location: [facility] CATH LAB;  Service: Cardiovascular;  Laterality: N/A;    Current Outpatient Prescriptions  Medication Sig Dispense Refill  . Cholecalciferol (D3-1000) 1000 UNITS capsule Take 1,000 Units by mouth daily.    Marland Kitchen docusate sodium 100 MG CAPS Take 100 mg by mouth 2 (two) times daily. 10 capsule 0  . estradiol (ESTRACE) 2 MG tablet Take 2 mg  by mouth at bedtime.     . fluconazole (DIFLUCAN) 150 MG tablet Take 1 tablet (150 mg total) by mouth once. Repeat in 3 days 2 tablet 1  . LIPITOR 40 MG tablet TAKE 1 TABLET BY MOUTH EVERY DAY 30 tablet 6  . norethindrone (AYGESTIN) 5 MG tablet Take 2.5 mg by mouth. Once Every 4 months for 10 days    . omega-3 acid ethyl esters (LOVAZA) 1 G capsule Take 2 g by mouth at bedtime.    . Probiotic Product (SOLUBLE FIBER/PROBIOTICS PO) Take 1 capsule by mouth at bedtime.     . tretinoin microspheres (RETIN-A MICRO) 0.1 % gel Apply 1 application topically at bedtime.     . vitamin B-12 (CYANOCOBALAMIN) 1000 MCG tablet Take 1,000 mcg by mouth daily.    Marland Kitchen amoxicillin (AMOXIL) 500 MG capsule TAKE 4 CS PO 1 HOUR BEFORE DENTAL PROCEDURES  11   No current facility-administered medications for this visit.    Physical Exam: Filed Vitals:   03/05/15 1142  BP: 138/54  Pulse: 64  Height: 5\' 4"  (1.626 m)  Weight: 135 lb (61.236 kg)    GEN- The patient is well appearing, alert and oriented x 3 today.   Head- normocephalic, atraumatic Eyes-  Sclera clear, conjunctiva pink Ears- hearing intact Oropharynx- clear Lungs- Clear to ausculation bilaterally, normal work of breathing Chest- pacemaker pocket is well healed Heart- Regular rate  and rhythm,3/6 SEM LUSB with radiation to the carotids, 2/3 diastolic murmur LUSB GI- soft, NT, ND, + BS Extremities- no clubbing, cyanosis, +1 R leg swelling, no homans or cords  Pacemaker interrogation- reviewed in detail today,  See PACEART report  Assessment and Plan:  1. Complete heart block Normal pacemaker function See Pace Art report No changes today  2.  Aortic valve disease I referred her previously to Dr Stanford Breed.  Due to the complexity of her valve disease, she primarily follows with Dr Michaelle Birks.  3. HL Stable No change required today  4. NSVT Rare and lasting on several seconds by ppm interrogation.  She has minor palpitations.  No presyncope or  syncope.  Would have low threshold to add coreg if episodes continue and EF remains <55%   Carelink Return to see EP NP in 12 months  Thompson Grayer MD, [facility] 03/05/2015 10:56 PM

## 2015-03-26 LAB — CUP PACEART INCLINIC DEVICE CHECK
Battery Impedance: 158 Ohm
Battery Remaining Longevity: 123 mo
Battery Voltage: 2.79 V
Brady Statistic AP VP Percent: 18 %
Brady Statistic AS VP Percent: 82 %
Implantable Lead Implant Date: 20131205
Implantable Lead Location: 753860
Implantable Lead Model: 5076
Lead Channel Impedance Value: 628 Ohm
Lead Channel Pacing Threshold Amplitude: 0.5 V
Lead Channel Pacing Threshold Amplitude: 0.75 V
Lead Channel Pacing Threshold Pulse Width: 0.4 ms
Lead Channel Pacing Threshold Pulse Width: 0.4 ms
Lead Channel Pacing Threshold Pulse Width: 0.4 ms
Lead Channel Sensing Intrinsic Amplitude: 2.8 mV
Lead Channel Setting Pacing Amplitude: 2 V
Lead Channel Setting Pacing Pulse Width: 0.4 ms
MDC IDC LEAD IMPLANT DT: 20131205
MDC IDC LEAD LOCATION: 753859
MDC IDC MSMT LEADCHNL RA IMPEDANCE VALUE: 405 Ohm
MDC IDC MSMT LEADCHNL RV PACING THRESHOLD AMPLITUDE: 0.5 V
MDC IDC MSMT LEADCHNL RV PACING THRESHOLD AMPLITUDE: 0.5 V
MDC IDC MSMT LEADCHNL RV PACING THRESHOLD PULSEWIDTH: 0.4 ms
MDC IDC SESS DTM: 20161207170002
MDC IDC SET LEADCHNL RV PACING AMPLITUDE: 2.5 V
MDC IDC SET LEADCHNL RV SENSING SENSITIVITY: 2.8 mV
MDC IDC STAT BRADY AP VS PERCENT: 0 %
MDC IDC STAT BRADY AS VS PERCENT: 0 %

## 2015-03-27 ENCOUNTER — Telehealth: Payer: Self-pay | Admitting: *Deleted

## 2015-03-27 NOTE — Telephone Encounter (Signed)
Submitted PA request and received a message from insurance stating that medication in on pt's covered list of medications and a PA is not required. JG//CMA
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
## 2015-04-04 LAB — HM MAMMOGRAPHY: HM MAMMO: NORMAL (ref 0–4)

## 2015-06-04 ENCOUNTER — Ambulatory Visit (INDEPENDENT_AMBULATORY_CARE_PROVIDER_SITE_OTHER): Payer: Medicare Other | Admitting: *Deleted

## 2015-06-04 DIAGNOSIS — I442 Atrioventricular block, complete: Secondary | ICD-10-CM | POA: Diagnosis not present

## 2015-06-06 NOTE — Progress Notes (Signed)
Remote pacemaker transmission.   

## 2015-06-07 LAB — CUP PACEART REMOTE DEVICE CHECK
Battery Impedance: 182 Ohm
Battery Remaining Longevity: 127 mo
Battery Voltage: 2.8 V
Brady Statistic AP VP Percent: 22 %
Brady Statistic AP VS Percent: 0 %
Brady Statistic AS VP Percent: 78 %
Brady Statistic AS VS Percent: 0 %
Date Time Interrogation Session: 20170308164737
Implantable Lead Implant Date: 20131205
Implantable Lead Location: 753859
Implantable Lead Location: 753860
Implantable Lead Model: 5076
Implantable Lead Model: 5092
Lead Channel Impedance Value: 405 Ohm
Lead Channel Impedance Value: 656 Ohm
Lead Channel Setting Pacing Amplitude: 2 V
Lead Channel Setting Sensing Sensitivity: 2.8 mV
MDC IDC LEAD IMPLANT DT: 20131205
MDC IDC SET LEADCHNL RV PACING AMPLITUDE: 2.5 V
MDC IDC SET LEADCHNL RV PACING PULSEWIDTH: 0.4 ms

## 2015-06-07 NOTE — Progress Notes (Signed)
Normal remote reviewed.  Next Carelink 09/03/15 

## 2015-06-11 ENCOUNTER — Encounter: Payer: Self-pay | Admitting: Cardiology

## 2015-07-01 ENCOUNTER — Other Ambulatory Visit: Payer: Self-pay | Admitting: Family Medicine

## 2015-07-01 ENCOUNTER — Encounter: Payer: Medicare Other | Admitting: Family Medicine

## 2015-07-03 ENCOUNTER — Encounter: Payer: Self-pay | Admitting: Family Medicine

## 2015-07-03 ENCOUNTER — Ambulatory Visit (INDEPENDENT_AMBULATORY_CARE_PROVIDER_SITE_OTHER): Payer: Medicare Other | Admitting: Family Medicine

## 2015-07-03 VITALS — BP 122/68 | HR 76 | Temp 98.6°F | Resp 16 | Ht 64.0 in | Wt 133.2 lb

## 2015-07-03 DIAGNOSIS — Z0000 Encounter for general adult medical examination without abnormal findings: Secondary | ICD-10-CM | POA: Diagnosis not present

## 2015-07-03 DIAGNOSIS — E785 Hyperlipidemia, unspecified: Secondary | ICD-10-CM | POA: Diagnosis not present

## 2015-07-03 LAB — HEPATIC FUNCTION PANEL
ALT: 14 U/L (ref 0–35)
AST: 22 U/L (ref 0–37)
Albumin: 3.9 g/dL (ref 3.5–5.2)
Alkaline Phosphatase: 49 U/L (ref 39–117)
BILIRUBIN DIRECT: 0.1 mg/dL (ref 0.0–0.3)
Total Bilirubin: 0.4 mg/dL (ref 0.2–1.2)
Total Protein: 7 g/dL (ref 6.0–8.3)

## 2015-07-03 LAB — BASIC METABOLIC PANEL
BUN: 20 mg/dL (ref 6–23)
CALCIUM: 9.5 mg/dL (ref 8.4–10.5)
CO2: 27 meq/L (ref 19–32)
Chloride: 106 mEq/L (ref 96–112)
Creatinine, Ser: 0.69 mg/dL (ref 0.40–1.20)
GFR: 89.29 mL/min (ref >60.00)
Glucose, Bld: 90 mg/dL (ref 70–99)
Potassium: 4 mEq/L (ref 3.5–5.1)
SODIUM: 139 meq/L (ref 135–145)

## 2015-07-03 LAB — CBC WITH DIFFERENTIAL/PLATELET
Basophils Absolute: 0 10*3/uL (ref 0.0–0.1)
Basophils Relative: 0.5 % (ref 0.0–3.0)
Eosinophils Absolute: 0.3 10*3/uL (ref 0.0–0.7)
Eosinophils Relative: 5.8 % — ABNORMAL HIGH (ref 0.0–5.0)
HCT: 40.5 % (ref 36.0–46.0)
Hemoglobin: 13.6 g/dL (ref 12.0–15.0)
LYMPHS ABS: 1.7 10*3/uL (ref 0.7–4.0)
Lymphocytes Relative: 30 % (ref 12.0–46.0)
MCHC: 33.5 g/dL (ref 30.0–36.0)
MCV: 91.8 fl (ref 78.0–100.0)
MONO ABS: 0.4 10*3/uL (ref 0.1–1.0)
Monocytes Relative: 7.9 % (ref 3.0–12.0)
NEUTROS ABS: 3.2 10*3/uL (ref 1.4–7.7)
NEUTROS PCT: 55.8 % (ref 43.0–77.0)
PLATELETS: 176 10*3/uL (ref 150.0–400.0)
RBC: 4.41 Mil/uL (ref 3.87–5.11)
RDW: 13.7 % (ref 11.5–15.5)
WBC: 5.7 10*3/uL (ref 4.0–10.5)

## 2015-07-03 LAB — LIPID PANEL
CHOL/HDL RATIO: 3
Cholesterol: 157 mg/dL (ref 0–200)
HDL: 53.2 mg/dL (ref >39.00)
LDL Cholesterol: 88 mg/dL (ref 0–99)
NonHDL: 103.45
Triglycerides: 75 mg/dL (ref 0.0–149.0)
VLDL: 15 mg/dL (ref 0.0–40.0)

## 2015-07-03 LAB — TSH: TSH: 2.19 u[IU]/mL (ref 0.35–4.50)

## 2015-07-03 NOTE — Progress Notes (Signed)
Pre visit review using our clinic review tool, if applicable. No additional management support is needed unless otherwise documented below in the visit note. 

## 2015-07-03 NOTE — Progress Notes (Signed)
   Subjective:    Patient ID: Angel Waller, female    DOB: [birth year], 70 y.o.   MRN: [id number]  HPI Here today for CPE.  Risk Factors: Hyperlipidemia- chronic problem, on Lipitor 40mg . Physical Activity: Fall Risk: low risk Depression: denies current sxs Hearing: normal to conversational tones ADL's: independent Cognitive: normal linear thought process, memory and attention intact Home Safety: safe at home Height, Weight, BMI, Visual Acuity: see vitals, vision corrected to 20/20 w/ glasses Counseling: UTD on colonoscopy, mammo, pap Care team reviewed and updated Labs Ordered: See A&P Care Plan: See A&P    Review of Systems Patient reports no vision/ hearing changes, adenopathy,fever, weight change,  persistant/recurrent hoarseness , swallowing issues, chest pain, palpitations, edema, persistant/recurrent cough, hemoptysis, dyspnea (rest/exertional/paroxysmal nocturnal), gastrointestinal bleeding (melena, rectal bleeding), abdominal pain, significant heartburn, bowel changes, GU symptoms (dysuria, hematuria, incontinence), Gyn symptoms (abnormal  bleeding, pain),  syncope, focal weakness, memory loss, numbness & tingling, skin/hair/nail changes, abnormal bruising or bleeding, anxiety, or depression.     Objective:   Physical Exam General Appearance:    Alert, cooperative, no distress, appears stated age  Head:    Normocephalic, without obvious abnormality, atraumatic  Eyes:    PERRL, conjunctiva/corneas clear, EOM's intact, fundi    benign, both eyes  Ears:    Normal TM's and external ear canals, both ears  Nose:   Nares normal, septum midline, mucosa normal, no drainage    or sinus tenderness  Throat:   Lips, mucosa, and tongue normal; teeth and gums normal  Neck:   Supple, symmetrical, trachea midline, no adenopathy;    Thyroid: no enlargement/tenderness/nodules  Back:     Symmetric, no curvature, ROM normal, no CVA tenderness  Lungs:     Clear to auscultation bilaterally,  respirations unlabored  Chest Wall:    No tenderness or deformity   Heart:    Regular rate and rhythm, S1 and S2 normal, II-III/VI SEM murmur, rub or gallop  Breast Exam:    Deferred to GYN  Abdomen:     Soft, non-tender, bowel sounds active all four quadrants,    no masses, no organomegaly  Genitalia:    Deferred to GYN  Rectal:    Extremities:   Extremities normal, atraumatic, no cyanosis or edema  Pulses:   2+ and symmetric all extremities  Skin:   Skin color, texture, turgor normal, no rashes or lesions  Lymph nodes:   Cervical, supraclavicular, and axillary nodes normal  Neurologic:   CNII-XII intact, normal strength, sensation and reflexes    throughout          Assessment & Plan:

## 2015-07-03 NOTE — Patient Instructions (Signed)
Follow up in 6 months to recheck cholesterol We'll notify you of your lab results and make any changes if needed Keep up the good work on healthy diet and regular exercise- you look great!! You are up to date on colonoscopy, mammo, pap- great job!!! Call with any questions or concerns Thanks for sticking with [place]! Happy Spring!!!

## 2015-07-05 NOTE — Assessment & Plan Note (Signed)
Chronic problem.  Tolerating statin w/o difficulty.  Check labs.  Adjust meds prn  

## 2015-07-05 NOTE — Assessment & Plan Note (Signed)
Pt's PE WNL.  UTD on pap, mammo, colonoscopy.  Written screening schedule updated and given to pt.  Check labs.  Anticipatory guidance provided.

## 2015-08-04 ENCOUNTER — Other Ambulatory Visit: Payer: Self-pay | Admitting: Family Medicine

## 2015-08-04 NOTE — Telephone Encounter (Signed)
Medication filled to pharmacy as requested.   

## 2015-08-28 ENCOUNTER — Encounter: Payer: Self-pay | Admitting: Medical

## 2015-08-28 ENCOUNTER — Ambulatory Visit (INDEPENDENT_AMBULATORY_CARE_PROVIDER_SITE_OTHER): Payer: Medicare Other | Admitting: Medical

## 2015-08-28 VITALS — BP 122/70 | HR 78 | Temp 98.1°F | Ht 64.0 in | Wt 136.2 lb

## 2015-08-28 DIAGNOSIS — R0981 Nasal congestion: Secondary | ICD-10-CM

## 2015-08-28 DIAGNOSIS — H6122 Impacted cerumen, left ear: Secondary | ICD-10-CM | POA: Diagnosis not present

## 2015-08-28 LAB — HM PAP SMEAR

## 2015-08-28 MED ORDER — FLUTICASONE PROPIONATE 50 MCG/ACT NA SUSP: 2.0000 | Freq: Every day | NASAL | Status: DC

## 2015-08-28 NOTE — Patient Instructions (Signed)
Your wax was removed about 90%. You use occasional debrox in the future. If re accumulates then return and could lavage again.  For nasal congestion possibly allergy related rx flonase  Follow up as needed recurrent wax impaction or as regularly scheduled.

## 2015-08-28 NOTE — Progress Notes (Signed)
Subjective:    Patient ID: Angel Waller, female    DOB: Feb 02, 1946, 70 y.o.   MRN: [id number]  HPI  Pt in with some left ear pressure/blocked feeling. Pt had this for 4-5 days. Pt states could feel wax building up since April. No ear pain. No fever, no chills. Mild nasal congestion.     Review of Systems  Constitutional: Negative for fever, chills and fatigue.  HENT: Negative for congestion, ear discharge, ear pain, hearing loss, nosebleeds, postnasal drip, rhinorrhea, sinus pressure, sneezing and sore throat.        Decreased hearing. Ear pressure.  Respiratory: Negative for cough, chest tightness, shortness of breath and wheezing.   Cardiovascular: Negative for chest pain and palpitations.  Gastrointestinal: Negative for abdominal pain.    Past Medical History  Diagnosis Date  . Seasonal allergies   . Hyperlipemia   . S/P AVR (aortic valve replacement)     July 2010  . Migraines     "occasionally" (02/29/2012)  . Arthritis     "right knee" (02/29/2012)  . Complete heart block ([place])   . Pacemaker      Social History   Social History  . Marital Status: Married    Spouse Name: N/A  . Number of Children: N/A  . Years of Education: N/A   Occupational History  . Special Education Teacher    Social History Main Topics  . Smoking status: Former Smoker -- 0.50 packs/day for 42 years    Types: Cigarettes    Quit date: 03/30/2007  . Smokeless tobacco: Never Used     Comment: smoked ages [age], up to 1 pp WEEK  . Alcohol Use: No  . Drug Use: No  . Sexual Activity: Yes   Other Topics Concern  . Not on file   Social History Narrative    Past Surgical History  Procedure Laterality Date  . Colonoscopy  2005    negative; [place], [place]  . Tonsillectomy and adenoidectomy  1962  . Cardiac valve replacement  2010    tissue aortic valve; [facility]  . Permanent pacemaker insertion  03/02/2012    Medtronic Adapta L implanted by Dr Rayann Heman  . Knee arthroscopy Right   .  Insert / replace / remove pacemaker    . Total knee arthroplasty Right 01/07/2014    Procedure: RIGHT TOTAL KNEE ARTHROPLASTY;  Surgeon: Mauri Pole, MD;  Location: [facility];  Service: Orthopedics;  Laterality: Right;  . Temporary pacemaker insertion N/A 03/01/2012    Procedure: TEMPORARY PACEMAKER INSERTION;  Surgeon: Burnell Blanks, MD;  Location: [facility] CATH LAB;  Service: Cardiovascular;  Laterality: N/A;  . Permanent pacemaker insertion N/A 03/02/2012    Procedure: PERMANENT PACEMAKER INSERTION;  Surgeon: Thompson Grayer, MD;  Location: [facility] CATH LAB;  Service: Cardiovascular;  Laterality: N/A;    Family History  Problem Relation Age of Onset  . Heart attack Mother 29    CABG  . Heart attack Father 57    aneurysm post MI  . Colon polyps Father   . Heart disease      in both M & P uncles & aunts; no premature MI  . Diabetes Neg Hx   . Stroke Neg Hx   . Colon cancer Neg Hx   . Pancreatic cancer Neg Hx   . Rectal cancer Neg Hx   . Stomach cancer Neg Hx     No Known Allergies  Current Outpatient Prescriptions on File Prior to Visit  Medication Sig  Dispense Refill  . amoxicillin (AMOXIL) 500 MG capsule Reported on 07/03/2015  11  . atorvastatin (LIPITOR) 40 MG tablet TAKE 1 TABLET BY MOUTH DAILY 30 tablet 6  . Cholecalciferol (D3-1000) 1000 UNITS capsule Take 2,000 Units by mouth daily.     Marland Kitchen estradiol (ESTRACE) 2 MG tablet Take 2 mg by mouth at bedtime.     . norethindrone (AYGESTIN) 5 MG tablet Take 2.5 mg by mouth. Once Every 4 months for 10 days    . omega-3 acid ethyl esters (LOVAZA) 1 G capsule Take 2 g by mouth at bedtime.    . Probiotic Product (SOLUBLE FIBER/PROBIOTICS PO) Take 1 capsule by mouth at bedtime.     . tretinoin microspheres (RETIN-A MICRO) 0.1 % gel Apply 1 application topically at bedtime.      No current facility-administered medications on file prior to visit.    BP 122/70 mmHg  Pulse 78  Temp(Src) 98.1 F (36.7 C) (Oral)  Ht 5\' 4"  (1.626 m)  Wt 136  lb 3.2 oz (61.78 kg)  BMI 23.37 kg/m2  SpO2 96%       Objective:   Physical Exam  General  Mental Status - Alert. General Appearance - Well groomed. Not in acute distress.  Skin Rashes- No Rashes.  HEENT Head- Normal. Ear Auditory Canal - Left- Normal. Right - Normal.Tympanic Membrane- Left- Moderate to severe cerumen impaction(did not see portion of tm) Post lavage removed about 90% of wax. Some with currette. Small film over tm not removed.. Right- Normal. Eye Sclera/Conjunctiva- Left- Normal. Right- Normal. Nose & Sinuses Nasal Mucosa- Left-  Boggy and Congested. Right-  Boggy and  Congested.Bilateral no  maxillary and no frontal sinus pressure. Mouth & Throat Lips: Upper Lip- Normal: no dryness, cracking, pallor, cyanosis, or vesicular eruption. Lower Lip-Normal: no dryness, cracking, pallor, cyanosis or vesicular eruption. Buccal Mucosa- Bilateral- No Aphthous ulcers. Oropharynx- No Discharge or Erythema. Tonsils: Characteristics- Bilateral- No Erythema or Congestion. Size/Enlargement- Bilateral- No enlargement. Discharge- bilateral-None.  Neck Neck- Supple. No Masses.   Chest and Lung Exam Auscultation: Breath Sounds:-Clear even and unlabored.  Cardiovascular Auscultation:Rythm- Regular, rate and rhythm. Murmurs & Other Heart Sounds:Ausculatation of the heart reveal- No Murmurs.  Lymphatic Head & Neck General Head & Neck Lymphatics: Bilateral: Description- No Localized lymphadenopathy.       Assessment & Plan:  Your wax was removed about 90%. You use occasional debrox in the future. If re accumulates then return and could lavage again.  For nasal congestion possibly allergy related rx flonase  Follow up as needed recurrent wax impaction or as regularly scheduled.

## 2015-08-28 NOTE — Progress Notes (Signed)
Pre visit review using our clinic review tool, if applicable. No additional management support is needed unless otherwise documented below in the visit note. 

## 2015-08-29 DIAGNOSIS — Z01419 Encounter for gynecological examination (general) (routine) without abnormal findings: Secondary | ICD-10-CM | POA: Insufficient documentation

## 2015-09-03 ENCOUNTER — Ambulatory Visit (INDEPENDENT_AMBULATORY_CARE_PROVIDER_SITE_OTHER): Payer: Medicare Other | Admitting: *Deleted

## 2015-09-03 DIAGNOSIS — Z950 Presence of cardiac pacemaker: Secondary | ICD-10-CM | POA: Diagnosis not present

## 2015-09-03 DIAGNOSIS — I442 Atrioventricular block, complete: Secondary | ICD-10-CM | POA: Diagnosis not present

## 2015-09-03 NOTE — Progress Notes (Signed)
Remote pacemaker transmission.   

## 2015-09-11 LAB — CUP PACEART REMOTE DEVICE CHECK
Battery Remaining Longevity: 114 mo
Battery Voltage: 2.79 V
Brady Statistic AP VS Percent: 0 %
Brady Statistic AS VS Percent: 0 %
Implantable Lead Implant Date: 20131205
Implantable Lead Implant Date: 20131205
Implantable Lead Location: 753859
Lead Channel Impedance Value: 400 Ohm
Lead Channel Impedance Value: 624 Ohm
Lead Channel Pacing Threshold Pulse Width: 0.4 ms
Lead Channel Pacing Threshold Pulse Width: 0.4 ms
Lead Channel Setting Pacing Amplitude: 2 V
Lead Channel Setting Pacing Pulse Width: 0.4 ms
Lead Channel Setting Sensing Sensitivity: 2.8 mV
MDC IDC LEAD LOCATION: 753860
MDC IDC MSMT BATTERY IMPEDANCE: 206 Ohm
MDC IDC MSMT LEADCHNL RA PACING THRESHOLD AMPLITUDE: 0.5 V
MDC IDC MSMT LEADCHNL RA SENSING INTR AMPL: 1 mV
MDC IDC MSMT LEADCHNL RV PACING THRESHOLD AMPLITUDE: 0.625 V
MDC IDC SESS DTM: 20170607120346
MDC IDC SET LEADCHNL RV PACING AMPLITUDE: 2.5 V
MDC IDC STAT BRADY AP VP PERCENT: 22 %
MDC IDC STAT BRADY AS VP PERCENT: 78 %

## 2015-09-17 ENCOUNTER — Encounter: Payer: Self-pay | Admitting: Cardiology

## 2015-10-02 ENCOUNTER — Encounter: Payer: Self-pay | Admitting: Cardiology

## 2015-12-03 ENCOUNTER — Ambulatory Visit (INDEPENDENT_AMBULATORY_CARE_PROVIDER_SITE_OTHER): Payer: Medicare Other | Admitting: *Deleted

## 2015-12-03 DIAGNOSIS — I442 Atrioventricular block, complete: Secondary | ICD-10-CM | POA: Diagnosis not present

## 2015-12-03 DIAGNOSIS — Z950 Presence of cardiac pacemaker: Secondary | ICD-10-CM

## 2015-12-03 NOTE — Progress Notes (Signed)
Remote pacemaker transmission.   

## 2015-12-05 ENCOUNTER — Encounter: Payer: Self-pay | Admitting: Cardiology

## 2015-12-13 LAB — CUP PACEART REMOTE DEVICE CHECK
Battery Remaining Longevity: 119 mo
Brady Statistic AP VP Percent: 22 %
Brady Statistic AP VS Percent: 0 %
Brady Statistic AS VS Percent: 0 %
Date Time Interrogation Session: 20170906150436
Implantable Lead Implant Date: 20131205
Implantable Lead Location: 753859
Implantable Lead Location: 753860
Implantable Lead Model: 5092
Lead Channel Impedance Value: 626 Ohm
Lead Channel Pacing Threshold Amplitude: 0.5 V
Lead Channel Pacing Threshold Pulse Width: 0.4 ms
Lead Channel Pacing Threshold Pulse Width: 0.4 ms
Lead Channel Setting Pacing Amplitude: 2 V
Lead Channel Setting Pacing Amplitude: 2.5 V
Lead Channel Setting Pacing Pulse Width: 0.4 ms
Lead Channel Setting Sensing Sensitivity: 2.8 mV
MDC IDC LEAD IMPLANT DT: 20131205
MDC IDC MSMT BATTERY IMPEDANCE: 182 Ohm
MDC IDC MSMT BATTERY VOLTAGE: 2.79 V
MDC IDC MSMT LEADCHNL RA IMPEDANCE VALUE: 415 Ohm
MDC IDC MSMT LEADCHNL RV PACING THRESHOLD AMPLITUDE: 0.5 V
MDC IDC STAT BRADY AS VP PERCENT: 77 %

## 2016-01-06 ENCOUNTER — Ambulatory Visit (INDEPENDENT_AMBULATORY_CARE_PROVIDER_SITE_OTHER): Payer: Medicare Other | Admitting: Family Medicine

## 2016-01-06 ENCOUNTER — Encounter: Payer: Self-pay | Admitting: Family Medicine

## 2016-01-06 VITALS — BP 123/73 | HR 65 | Temp 98.0°F | Resp 16 | Ht 64.0 in | Wt 139.2 lb

## 2016-01-06 DIAGNOSIS — E785 Hyperlipidemia, unspecified: Secondary | ICD-10-CM | POA: Diagnosis not present

## 2016-01-06 DIAGNOSIS — Z23 Encounter for immunization: Secondary | ICD-10-CM

## 2016-01-06 LAB — LIPID PANEL
CHOLESTEROL: 167 mg/dL (ref 0–200)
HDL: 48.4 mg/dL (ref >39.00)
LDL CALC: 103 mg/dL — AB (ref 0–99)
NonHDL: 119.02
Total CHOL/HDL Ratio: 3
Triglycerides: 82 mg/dL (ref 0.0–149.0)
VLDL: 16.4 mg/dL (ref 0.0–40.0)

## 2016-01-06 LAB — BASIC METABOLIC PANEL
BUN: 13 mg/dL (ref 6–23)
CALCIUM: 9.2 mg/dL (ref 8.4–10.5)
CO2: 27 meq/L (ref 19–32)
CREATININE: 0.77 mg/dL (ref 0.40–1.20)
Chloride: 104 mEq/L (ref 96–112)
GFR: 78.56 mL/min (ref >60.00)
GLUCOSE: 85 mg/dL (ref 70–99)
Potassium: 4.3 mEq/L (ref 3.5–5.1)
Sodium: 138 mEq/L (ref 135–145)

## 2016-01-06 LAB — HEPATIC FUNCTION PANEL
ALT: 9 U/L (ref 0–35)
AST: 18 U/L (ref 0–37)
Albumin: 3.7 g/dL (ref 3.5–5.2)
Alkaline Phosphatase: 42 U/L (ref 39–117)
Bilirubin, Direct: 0.1 mg/dL (ref 0.0–0.3)
TOTAL PROTEIN: 6.5 g/dL (ref 6.0–8.3)
Total Bilirubin: 0.6 mg/dL (ref 0.2–1.2)

## 2016-01-06 NOTE — Patient Instructions (Signed)
Schedule your complete physical in 6 months We'll notify you of your lab results and make any changes if needed Keep up the good work on healthy diet and regular exercise- you look great!! Call with any questions or concerns Happy Fall!!! 

## 2016-01-06 NOTE — Progress Notes (Signed)
   Subjective:    Patient ID: Angel Waller, female    DOB: August 31, 1945, 70 y.o.   MRN: [id number]  HPI Hyperlipidemia- chronic problem.  On Lipitor daily.  Pt has gained 3 lbs since last visit.   Exercising regularly.  No CP, SOB, HAs, visual changes, abd pain, N/V.    Review of Systems For ROS see HPI     Objective:   Physical Exam  Constitutional: She is oriented to person, place, and time. She appears well-developed and well-nourished. No distress.  HENT:  Head: Normocephalic and atraumatic.  Eyes: Conjunctivae and EOM are normal. Pupils are equal, round, and reactive to light.  Neck: Normal range of motion. Neck supple. No thyromegaly present.  Cardiovascular: Normal rate, regular rhythm and intact distal pulses.   Murmur (II-III/VI blowing SEM) heard. Pulmonary/Chest: Effort normal and breath sounds normal. No respiratory distress.  Abdominal: Soft. She exhibits no distension. There is no tenderness.  Musculoskeletal: She exhibits no edema.  Lymphadenopathy:    She has no cervical adenopathy.  Neurological: She is alert and oriented to person, place, and time.  Skin: Skin is warm and dry.  Psychiatric: She has a normal mood and affect. Her behavior is normal.  Vitals reviewed.         Assessment & Plan:

## 2016-01-06 NOTE — Progress Notes (Signed)
Pre visit review using our clinic review tool, if applicable. No additional management support is needed unless otherwise documented below in the visit note. 

## 2016-01-06 NOTE — Assessment & Plan Note (Signed)
Chronic problem.  Tolerating statin w/o difficulty.  Encouraged healthy diet and regular exercise.  Check labs.  Adjust meds prn. 

## 2016-01-21 ENCOUNTER — Encounter: Payer: Self-pay | Admitting: Nurse Practitioner

## 2016-02-23 ENCOUNTER — Other Ambulatory Visit: Payer: Self-pay | Admitting: Family Medicine

## 2016-02-27 ENCOUNTER — Encounter: Payer: Self-pay | Admitting: Physician Assistant

## 2016-03-08 ENCOUNTER — Encounter: Payer: Medicare Other | Admitting: Physician Assistant

## 2016-03-08 ENCOUNTER — Encounter: Payer: Medicare Other | Admitting: Nurse Practitioner

## 2016-03-10 ENCOUNTER — Encounter: Payer: Self-pay | Admitting: Nurse Practitioner

## 2016-03-18 ENCOUNTER — Ambulatory Visit (INDEPENDENT_AMBULATORY_CARE_PROVIDER_SITE_OTHER): Payer: Medicare Other | Admitting: Nurse Practitioner

## 2016-03-18 ENCOUNTER — Encounter: Payer: Self-pay | Admitting: Nurse Practitioner

## 2016-03-18 VITALS — BP 130/62 | HR 66 | Ht 64.0 in | Wt 135.4 lb

## 2016-03-18 DIAGNOSIS — I442 Atrioventricular block, complete: Secondary | ICD-10-CM | POA: Diagnosis not present

## 2016-03-18 DIAGNOSIS — I4729 Other ventricular tachycardia: Secondary | ICD-10-CM

## 2016-03-18 DIAGNOSIS — Z952 Presence of prosthetic heart valve: Secondary | ICD-10-CM

## 2016-03-18 DIAGNOSIS — I472 Ventricular tachycardia: Secondary | ICD-10-CM | POA: Diagnosis not present

## 2016-03-18 LAB — CUP PACEART INCLINIC DEVICE CHECK
Date Time Interrogation Session: 20171221133448
Implantable Lead Implant Date: 20131205
Implantable Lead Implant Date: 20131205
Implantable Lead Location: 753859
Implantable Lead Model: 5076
Implantable Pulse Generator Implant Date: 20131205
MDC IDC LEAD LOCATION: 753860

## 2016-03-18 NOTE — Progress Notes (Signed)
Electrophysiology Office Note Date: 03/18/2016  ID:  KATHYREN MARSIGLIA, DOB Sep 10, 1945, MRN [id number]  PCP: Annye Asa, MD Primary Cardiologist: Gilmer Mor at [facility] Electrophysiologist: [place]  CC: Pacemaker follow-up  Angel Waller is a 70 y.o. female seen today for Dr Rayann Heman.  She presents today for routine electrophysiology followup.  Since last being seen in our clinic, the patient reports doing very well.  She continues to have occasional palpitations that are chronic since pacemaker implant but denies chest pain, dyspnea, PND, orthopnea, nausea, vomiting, dizziness, syncope, edema, weight gain, or early satiety.  Device History: MDT dual chamber PPM implanted 2013 for SSS   Past Medical History:  Diagnosis Date  . Arthritis    "right knee" (02/29/2012)  . Complete heart block ([place])   . Hyperlipemia   . Migraines    "occasionally" (02/29/2012)  . Pacemaker   . S/P AVR (aortic valve replacement)    July 2010  . Seasonal allergies    Past Surgical History:  Procedure Laterality Date  . CARDIAC VALVE REPLACEMENT  2010   tissue aortic valve; [facility]  . COLONOSCOPY  2005   negative; [place], [place]  . INSERT / REPLACE / REMOVE PACEMAKER    . KNEE ARTHROSCOPY Right   . PERMANENT PACEMAKER INSERTION  03/02/2012   Medtronic Adapta L implanted by Dr Rayann Heman  . PERMANENT PACEMAKER INSERTION N/A 03/02/2012   Procedure: PERMANENT PACEMAKER INSERTION;  Surgeon: Thompson Grayer, MD;  Location: [facility] CATH LAB;  Service: Cardiovascular;  Laterality: N/A;  . TEMPORARY PACEMAKER INSERTION N/A 03/01/2012   Procedure: TEMPORARY PACEMAKER INSERTION;  Surgeon: Burnell Blanks, MD;  Location: [facility] CATH LAB;  Service: Cardiovascular;  Laterality: N/A;  . [place]  . TOTAL KNEE ARTHROPLASTY Right 01/07/2014   Procedure: RIGHT TOTAL KNEE ARTHROPLASTY;  Surgeon: Mauri Pole, MD;  Location: [facility];  Service: Orthopedics;  Laterality: Right;    Current  Outpatient Prescriptions  Medication Sig Dispense Refill  . amoxicillin (AMOXIL) 500 MG capsule Patient takes 4 tablets once for dental procedures.  11  . atorvastatin (LIPITOR) 40 MG tablet TAKE 1 TABLET BY MOUTH DAILY 30 tablet 6  . Cholecalciferol (D3-1000) 1000 UNITS capsule Take 2,000 Units by mouth daily.     Marland Kitchen estradiol (ESTRACE) 2 MG tablet Take 2 mg by mouth at bedtime.     . fluticasone (FLONASE) 50 MCG/ACT nasal spray Place 2 sprays into both nostrils daily. 16 g 1  . norethindrone (AYGESTIN) 5 MG tablet Take 2.5 mg by mouth. Once Every 4 months for 10 days    . omega-3 acid ethyl esters (LOVAZA) 1 G capsule Take 2 g by mouth at bedtime.    . Probiotic Product (SOLUBLE FIBER/PROBIOTICS PO) Take 1 capsule by mouth at bedtime.     . tretinoin microspheres (RETIN-A MICRO) 0.1 % gel Apply 1 application topically at bedtime.      No current facility-administered medications for this visit.     Allergies:   Patient has no known allergies.   Social History: Social History   Social History  . Marital status: Married    Spouse name: N/A  . Number of children: N/A  . Years of education: N/A   Occupational History  . Special Education Teacher    Social History Main Topics  . Smoking status: Former Smoker    Packs/day: 0.50    Years: 42.00    Types: Cigarettes    Quit date: 03/30/2007  . Smokeless tobacco:  Never Used     Comment: smoked ages [age], up to 1 pp WEEK  . Alcohol use No  . Drug use: No  . Sexual activity: Yes   Other Topics Concern  . Not on file   Social History Narrative  . No narrative on file    Family History: Family History  Problem Relation Age of Onset  . Heart attack Mother 56    CABG  . Heart attack Father 93    aneurysm post MI  . Colon polyps Father   . Heart disease      in both M & P uncles & aunts; no premature MI  . Diabetes Neg Hx   . Stroke Neg Hx   . Colon cancer Neg Hx   . Pancreatic cancer Neg Hx   . Rectal cancer Neg Hx   .  Stomach cancer Neg Hx      Review of Systems: All other systems reviewed and are otherwise negative except as noted above.   Physical Exam: VS:  BP 130/62   Pulse 66   Ht 5\' 4"  (1.626 m)   Wt 135 lb 6.4 oz (61.4 kg)   LMP  (LMP Unknown)   BMI 23.24 kg/m  , BMI Body mass index is 23.24 kg/m.  GEN- The patient is well appearing, alert and oriented x 3 today.   HEENT: normocephalic, atraumatic; sclera clear, conjunctiva pink; hearing intact; oropharynx clear; neck supple Lungs- Clear to ausculation bilaterally, normal work of breathing.  No wheezes, rales, rhonchi Heart- Regular rate and rhythm, 2/6 SEM GI- soft, non-tender, non-distended, bowel sounds present  Extremities- no clubbing, cyanosis, or edema; DP/PT/radial pulses 2+ bilaterally MS- no significant deformity or atrophy Skin- warm and dry, no rash or lesion; PPM pocket well healed Psych- euthymic mood, full affect Neuro- strength and sensation are intact  PPM Interrogation- reviewed in detail today,  See PACEART report  EKG:  EKG is ordered today. The ekg ordered today shows sinus rhythm with ventricular pacing  Recent Labs: 07/03/2015: Hemoglobin 13.6; Platelets 176.0; TSH 2.19 01/06/2016: ALT 9; BUN 13; Creatinine, Ser 0.77; Potassium 4.3; Sodium 138   Wt Readings from Last 3 Encounters:  03/18/16 135 lb 6.4 oz (61.4 kg)  01/06/16 139 lb 4 oz (63.2 kg)  08/28/15 136 lb 3.2 oz (61.8 kg)     Other studies Reviewed: Additional studies/ records that were reviewed today include: Dr Jackalyn Lombard office notes  Assessment and Plan:  1.  Complete heart block Normal PPM function See Pace Art report No changes today  2.  Aortic valve disease s/p AVR With known peri-valvular leak and followed at [facility]  3.  NSVT Burden by device interrogation stable EF 50% by recent echo at [facility] I offered addition of Coreg today, but she was previously on Metoprolol and would like to hold off on treatment until burden  increases/symptoms worsen.    Current medicines are reviewed at length with the patient today.   The patient does not have concerns regarding her medicines.  The following changes were made today:  none  Labs/ tests ordered today include: none Orders Placed This Encounter  Procedures  . EKG 12-Lead     Disposition:   Follow up with [facility], Dr Rayann Heman 1 year    Signed, Chanetta Marshall, NP 03/18/2016 12:09 PM  [address] [place] [address] [phone] (office) [phone] (fax

## 2016-03-18 NOTE — Patient Instructions (Signed)
  Medication Instructions:   Your physician recommends that you continue on your current medications as directed. Please refer to the Current Medication list given to you today.    If you need a refill on your cardiac medications before your next appointment, please call your pharmacy.  Labwork: NONE ORDERED  TODAY    Testing/Procedures: NONE ORDERED  TODAY    Follow-Up:  Your physician wants you to follow-up in: Angel Waller will receive a reminder letter in the mail two months in advance. If you don't receive a letter, please call our office to schedule the follow-up appointment.  Remote monitoring is used to monitor your Pacemaker of ICD from home. This monitoring reduces the number of office visits required to check your device to one time per year. It allows [place] to keep an eye on the functioning of your device to ensure it is working properly. You are scheduled for a device check from home on . 06/17/2016..You may send your transmission at any time that day. If you have a wireless device, the transmission will be sent automatically. After your physician reviews your transmission, you will receive a postcard with your next transmission date.     Any Other Special Instructions Will Be Listed Below (If Applicable).

## 2016-04-01 ENCOUNTER — Telehealth: Payer: Self-pay | Admitting: Family Medicine

## 2016-04-01 MED ORDER — OSELTAMIVIR PHOSPHATE 75 MG PO CAPS: 75.0000 mg | ORAL_CAPSULE | Freq: Every day | ORAL | 0 refills | Status: DC

## 2016-04-01 NOTE — Telephone Encounter (Signed)
Pt states that daughter went to an urgent care for flu like symptoms. Pt and husband have been around her helping her move. Pt states that daughter did not test poss for flu, but still being treated as so and that they are showing no signs of the flu themselves, but asking if they are able to get an Rx for Tamiflu

## 2016-04-01 NOTE — Telephone Encounter (Signed)
Patient notified of PCP recommendations and is agreement and expresses an understanding.  meds filled for both pts.

## 2016-04-01 NOTE — Telephone Encounter (Signed)
Ok for Tamiflu 75mg  1 tab daily x10 days for flu prophylaxis for pt and husband Glendell Docker)

## 2016-06-17 ENCOUNTER — Encounter: Payer: Medicare Other | Admitting: *Deleted

## 2016-08-20 ENCOUNTER — Encounter: Payer: Self-pay | Admitting: Gastroenterology

## 2016-08-26 ENCOUNTER — Encounter: Payer: Self-pay | Admitting: Medical

## 2016-08-26 ENCOUNTER — Ambulatory Visit (INDEPENDENT_AMBULATORY_CARE_PROVIDER_SITE_OTHER): Payer: Medicare Other | Admitting: Medical

## 2016-08-26 VITALS — BP 117/58 | HR 69 | Temp 97.6°F | Resp 16 | Ht 64.0 in | Wt 133.0 lb

## 2016-08-26 DIAGNOSIS — H6123 Impacted cerumen, bilateral: Secondary | ICD-10-CM

## 2016-08-26 NOTE — Patient Instructions (Signed)
Your wax was clear about 90% with only faint flaky dry wax present now. If this re-occurs can soften with otc wax softener again and then come in for lavage. Follow up as needed.

## 2016-08-26 NOTE — Progress Notes (Signed)
   Subjective:    Patient ID: CLYTEE HEINRICH, female    DOB: [birth year], 71 y.o.   MRN: [id number]  HPI 1 wk of ear blocked sensation on left side. The she used otc product to soften for 3 days. No uri or obvious allergy symptoms. No fever, no chills or sweats.   Review of Systems See hpi    Objective:   Physical Exam  General  Mental Status - Alert. General Appearance - Well groomed. Not in acute distress.  Skin Rashes- No Rashes.  HEENT Head- Normal. Ear Auditory Canal - Left- Normal. Right - Normal.Tympanic Membrane- Left- prelavage blocked completely. Post lavage 90% of wax removed. Right- Normal. Eye Sclera/Conjunctiva- Left- Normal. Right- Normal. Nose & Sinuses Nasal Mucosa- Left-   No Boggy and Congested. Right-  No  Boggy and  Congested.Bilateral  No maxillary and no frontal sinus pressure.   Neck Neck- Supple. No Masses.   Chest and Lung Exam Auscultation: Breath Sounds:-Clear even and unlabored.  Cardiovascular Auscultation:Rythm- Regular, rate and rhythm. Murmurs & Other Heart Sounds:Ausculatation of the heart reveal- No Murmurs.         Assessment & Plan:  Your wax was clear about 90% with only faint flaky dry wax present now. If this re-occurs can soften with otc wax softener again and then come in for lavage. Follow up as needed.

## 2016-09-22 ENCOUNTER — Other Ambulatory Visit: Payer: Self-pay | Admitting: Family Medicine

## 2016-09-23 ENCOUNTER — Telehealth: Payer: Self-pay | Admitting: Family Medicine

## 2016-09-23 NOTE — Telephone Encounter (Signed)
Caller name: Zamor Relation to pt: Self  Call back number: [phone] Pharmacy:  Reason for call: Pt states saw [employer] as a regular visit and paid her copay that was 15.00 copay but pt recently received a bill for 20.00 and pt does not know why, please verify bill and let pt know the reason why receiving bill, pt did already call the billing department and stated that billing department informed her that Percell Miller was considered a specialist and that is why she has to pay 20.00, pt states that is not correct since she has seen Percell Miller before for the same situation and has never paid an extra 20.00 copay, please advise.

## 2016-09-24 NOTE — Telephone Encounter (Signed)
I have submitted this for correction and notified the patient

## 2016-10-10 LAB — HM MAMMOGRAPHY: HM MAMMO: NORMAL (ref 0–4)

## 2016-10-25 NOTE — Progress Notes (Deleted)
Subjective:   Angel Waller is a 71 y.o. female who presents for Medicare Annual (Subsequent) preventive examination.  Review of Systems:  No ROS.  Medicare Wellness Visit. Additional risk factors are reflected in the social history.    Sleep patterns:  Home Safety/Smoke Alarms: Feels safe in home. Smoke alarms in place.  Living environment; residence and Firearm Safety:  [place] Safety/Bike Helmet: Wears seat belt.   Counseling:   Eye Exam-  Dental-  Female:   Pap-2017       Mammo-04/04/2015,        Dexa scan-07/20/2011, Osteopenia       CCS-Colonoscopy 08/17/2013, polyp. Recall 3 years.      Objective:     Vitals: LMP  (LMP Unknown)   There is no height or weight on file to calculate BMI.   Tobacco History  Smoking Status  . Former Smoker  . Packs/day: 0.50  . Years: 42.00  . Types: Cigarettes  . Quit date: 03/30/2007  Smokeless Tobacco  . Never Used    Comment: smoked ages [age], up to 1 pp WEEK     Counseling given: Not Answered   Past Medical History:  Diagnosis Date  . Arthritis    "right knee" (02/29/2012)  . Complete heart block ([place])   . Hyperlipemia   . Migraines    "occasionally" (02/29/2012)  . Pacemaker   . S/P AVR (aortic valve replacement)    July 2010  . Seasonal allergies    Past Surgical History:  Procedure Laterality Date  . CARDIAC VALVE REPLACEMENT  2010   tissue aortic valve; [facility]  . COLONOSCOPY  2005   negative; [place], [place]  . INSERT / REPLACE / REMOVE PACEMAKER    . KNEE ARTHROSCOPY Right   . PERMANENT PACEMAKER INSERTION  03/02/2012   Medtronic Adapta L implanted by Dr Rayann Heman  . PERMANENT PACEMAKER INSERTION N/A 03/02/2012   Procedure: PERMANENT PACEMAKER INSERTION;  Surgeon: Thompson Grayer, MD;  Location: [facility] CATH LAB;  Service: Cardiovascular;  Laterality: N/A;  . TEMPORARY PACEMAKER INSERTION N/A 03/01/2012   Procedure: TEMPORARY PACEMAKER INSERTION;  Surgeon: Burnell Blanks, MD;  Location: [facility] CATH LAB;   Service: Cardiovascular;  Laterality: N/A;  . [place]  . TOTAL KNEE ARTHROPLASTY Right 01/07/2014   Procedure: RIGHT TOTAL KNEE ARTHROPLASTY;  Surgeon: Mauri Pole, MD;  Location: [facility];  Service: Orthopedics;  Laterality: Right;   Family History  Problem Relation Age of Onset  . Heart attack Mother 62       CABG  . Heart attack Father 57       aneurysm post MI  . Colon polyps Father   . Heart disease Unknown        in both M & P uncles & aunts; no premature MI  . Diabetes Neg Hx   . Stroke Neg Hx   . Colon cancer Neg Hx   . Pancreatic cancer Neg Hx   . Rectal cancer Neg Hx   . Stomach cancer Neg Hx    History  Sexual Activity  . Sexual activity: Yes    Outpatient Encounter Prescriptions as of 10/26/2016  Medication Sig  . atorvastatin (LIPITOR) 40 MG tablet TAKE 1 TABLET BY MOUTH DAILY  . Cholecalciferol (D3-1000) 1000 UNITS capsule Take 2,000 Units by mouth daily.   Marland Kitchen estradiol (ESTRACE) 2 MG tablet Take 2 mg by mouth at bedtime.   . fluticasone (FLONASE) 50 MCG/ACT nasal spray Place 2 sprays  into both nostrils daily.  . norethindrone (AYGESTIN) 5 MG tablet Take 2.5 mg by mouth. Once Every 4 months for 10 days  . omega-3 acid ethyl esters (LOVAZA) 1 G capsule Take 2 g by mouth at bedtime.  Marland Kitchen oseltamivir (TAMIFLU) 75 MG capsule Take 1 capsule (75 mg total) by mouth daily.  . Probiotic Product (SOLUBLE FIBER/PROBIOTICS PO) Take 1 capsule by mouth at bedtime.   . tretinoin microspheres (RETIN-A MICRO) 0.1 % gel Apply 1 application topically at bedtime.    No facility-administered encounter medications on file as of 10/26/2016.     Activities of Daily Living No flowsheet data found.  Patient Care Team: Midge Minium, MD as PCP - General (Family Medicine) Thompson Grayer, MD as Consulting Physician (Cardiology) Inda Castle, MD as Consulting Physician (Gastroenterology) Pleasant, Robley Fries., MD as Referring Physician  (Obstetrics and Gynecology)    Assessment:    *** Exercise Activities and Dietary recommendations    Goals    None     Fall Risk Fall Risk  07/03/2015 07/01/2014 06/13/2013 05/17/2012  Falls in the past year? No No No No   Depression Screen PHQ 2/9 Scores 07/03/2015 07/01/2014 06/13/2013 05/17/2012  PHQ - 2 Score 0 0 0 0     Cognitive Function        Immunization History  Administered Date(s) Administered  . Influenza Split 03/02/2011  . Influenza Whole 02/24/2010  . Influenza,inj,Quad PF,36+ Mos 02/26/2014, 12/31/2014, 01/06/2016  . Influenza-Unspecified 01/12/2012, 12/27/2012  . Pneumococcal Conjugate-13 07/01/2014  . Pneumococcal Polysaccharide-23 03/03/2012  . Tdap 02/29/2012   Screening Tests Health Maintenance  Topic Date Due  . COLONOSCOPY  08/17/2016  . PAP SMEAR  08/27/2016  . Hepatitis C Screening  12/27/2016 (Originally [birth year])  . INFLUENZA VACCINE  10/27/2016  . MAMMOGRAM  04/03/2017  . TETANUS/TDAP  02/28/2022  . DEXA SCAN  Completed  . PNA vac Low Risk Adult  Completed      Plan:   ***   I have personally reviewed and noted the following in the patient's chart:   . Medical and social history . Use of alcohol, tobacco or illicit drugs  . Current medications and supplements . Functional ability and status . Nutritional status . Physical activity . Advanced directives . List of other physicians . Hospitalizations, surgeries, and ER visits in previous 12 months . Vitals . Screenings to include cognitive, depression, and falls . Referrals and appointments  In addition, I have reviewed and discussed with patient certain preventive protocols, quality metrics, and best practice recommendations. A written personalized care plan for preventive services as well as general preventive health recommendations were provided to patient.     Gerilyn Nestle, RN  10/25/2016

## 2016-10-26 ENCOUNTER — Ambulatory Visit: Payer: Medicare Other

## 2016-10-26 ENCOUNTER — Encounter: Payer: Medicare Other | Admitting: Family Medicine

## 2016-10-27 ENCOUNTER — Telehealth: Payer: Self-pay | Admitting: Cardiology

## 2016-10-27 ENCOUNTER — Ambulatory Visit (INDEPENDENT_AMBULATORY_CARE_PROVIDER_SITE_OTHER): Payer: Medicare Other | Admitting: *Deleted

## 2016-10-27 DIAGNOSIS — I442 Atrioventricular block, complete: Secondary | ICD-10-CM | POA: Diagnosis not present

## 2016-10-27 NOTE — Progress Notes (Signed)
Remote pacemaker transmission.   

## 2016-10-27 NOTE — Telephone Encounter (Signed)
Spoke with pt and reminded pt of remote transmission that is due today. Pt verbalized understanding.   

## 2016-10-28 ENCOUNTER — Encounter: Payer: Self-pay | Admitting: Cardiology

## 2016-10-29 LAB — CUP PACEART REMOTE DEVICE CHECK
Battery Impedance: 254 Ohm
Battery Remaining Longevity: 106 mo
Battery Voltage: 2.79 V
Brady Statistic AP VP Percent: 26 %
Brady Statistic AS VS Percent: 0 %
Date Time Interrogation Session: 20180801142343
Implantable Lead Location: 753859
Implantable Lead Model: 5076
Implantable Lead Model: 5092
Implantable Pulse Generator Implant Date: 20131205
Lead Channel Impedance Value: 416 Ohm
Lead Channel Pacing Threshold Pulse Width: 0.4 ms
Lead Channel Sensing Intrinsic Amplitude: 1 mV
Lead Channel Setting Pacing Amplitude: 2 V
Lead Channel Setting Pacing Amplitude: 2.5 V
Lead Channel Setting Pacing Pulse Width: 0.4 ms
Lead Channel Setting Sensing Sensitivity: 2.8 mV
MDC IDC LEAD IMPLANT DT: 20131205
MDC IDC LEAD IMPLANT DT: 20131205
MDC IDC LEAD LOCATION: 753860
MDC IDC MSMT LEADCHNL RA PACING THRESHOLD AMPLITUDE: 0.5 V
MDC IDC MSMT LEADCHNL RA PACING THRESHOLD PULSEWIDTH: 0.4 ms
MDC IDC MSMT LEADCHNL RV IMPEDANCE VALUE: 580 Ohm
MDC IDC MSMT LEADCHNL RV PACING THRESHOLD AMPLITUDE: 0.5 V
MDC IDC STAT BRADY AP VS PERCENT: 0 %
MDC IDC STAT BRADY AS VP PERCENT: 74 %

## 2016-11-17 ENCOUNTER — Encounter: Payer: Self-pay | Admitting: Family Medicine

## 2016-11-17 ENCOUNTER — Ambulatory Visit: Payer: Medicare Other

## 2016-11-17 ENCOUNTER — Ambulatory Visit (INDEPENDENT_AMBULATORY_CARE_PROVIDER_SITE_OTHER): Payer: Medicare Other | Admitting: Family Medicine

## 2016-11-17 VITALS — BP 116/58 | HR 64 | Temp 98.2°F | Resp 18 | Ht 64.0 in | Wt 134.4 lb

## 2016-11-17 DIAGNOSIS — Z0000 Encounter for general adult medical examination without abnormal findings: Secondary | ICD-10-CM | POA: Diagnosis not present

## 2016-11-17 DIAGNOSIS — E785 Hyperlipidemia, unspecified: Secondary | ICD-10-CM

## 2016-11-17 DIAGNOSIS — Z23 Encounter for immunization: Secondary | ICD-10-CM | POA: Diagnosis not present

## 2016-11-17 DIAGNOSIS — Z1159 Encounter for screening for other viral diseases: Secondary | ICD-10-CM

## 2016-11-17 DIAGNOSIS — I442 Atrioventricular block, complete: Secondary | ICD-10-CM

## 2016-11-17 LAB — BASIC METABOLIC PANEL
BUN: 15 mg/dL (ref 6–23)
CHLORIDE: 107 meq/L (ref 96–112)
CO2: 26 mEq/L (ref 19–32)
CREATININE: 0.69 mg/dL (ref 0.40–1.20)
Calcium: 9.4 mg/dL (ref 8.4–10.5)
GFR: 88.94 mL/min (ref >60.00)
Glucose, Bld: 96 mg/dL (ref 70–99)
POTASSIUM: 4.4 meq/L (ref 3.5–5.1)
Sodium: 139 mEq/L (ref 135–145)

## 2016-11-17 LAB — CBC WITH DIFFERENTIAL/PLATELET
BASOS ABS: 0 10*3/uL (ref 0.0–0.1)
Basophils Relative: 0.6 % (ref 0.0–3.0)
EOS ABS: 0.2 10*3/uL (ref 0.0–0.7)
Eosinophils Relative: 4.5 % (ref 0.0–5.0)
HCT: 40.2 % (ref 36.0–46.0)
Hemoglobin: 13.5 g/dL (ref 12.0–15.0)
LYMPHS ABS: 1.8 10*3/uL (ref 0.7–4.0)
Lymphocytes Relative: 32.8 % (ref 12.0–46.0)
MCHC: 33.5 g/dL (ref 30.0–36.0)
MCV: 94.1 fl (ref 78.0–100.0)
MONO ABS: 0.3 10*3/uL (ref 0.1–1.0)
Monocytes Relative: 6.4 % (ref 3.0–12.0)
NEUTROS ABS: 3 10*3/uL (ref 1.4–7.7)
NEUTROS PCT: 55.7 % (ref 43.0–77.0)
PLATELETS: 162 10*3/uL (ref 150.0–400.0)
RBC: 4.27 Mil/uL (ref 3.87–5.11)
RDW: 13.8 % (ref 11.5–15.5)
WBC: 5.5 10*3/uL (ref 4.0–10.5)

## 2016-11-17 LAB — LIPID PANEL
CHOL/HDL RATIO: 3
CHOLESTEROL: 162 mg/dL (ref 0–200)
HDL: 53 mg/dL (ref >39.00)
LDL Cholesterol: 90 mg/dL (ref 0–99)
NonHDL: 109.07
TRIGLYCERIDES: 94 mg/dL (ref 0.0–149.0)
VLDL: 18.8 mg/dL (ref 0.0–40.0)

## 2016-11-17 LAB — HEPATIC FUNCTION PANEL
ALBUMIN: 4 g/dL (ref 3.5–5.2)
ALK PHOS: 42 U/L (ref 39–117)
ALT: 13 U/L (ref 0–35)
AST: 20 U/L (ref 0–37)
Bilirubin, Direct: 0.1 mg/dL (ref 0.0–0.3)
TOTAL PROTEIN: 6.7 g/dL (ref 6.0–8.3)
Total Bilirubin: 0.5 mg/dL (ref 0.2–1.2)

## 2016-11-17 LAB — TSH: TSH: 1.88 u[IU]/mL (ref 0.35–4.50)

## 2016-11-17 MED ORDER — ZOSTER VAC RECOMB ADJUVANTED 50 MCG/0.5ML IM SUSR: 0.5000 mL | Freq: Once | INTRAMUSCULAR | 1 refills | Status: AC

## 2016-11-17 NOTE — Assessment & Plan Note (Signed)
Chronic problem.  Following w/ Cards.  Currently asymptomatic.

## 2016-11-17 NOTE — Assessment & Plan Note (Signed)
Pt's PE unchanged from previous.  UTD on GYN, due for recall colonoscopy- pt plans to schedule.  Flu shot given today.  UTD on other immunizations.  Check labs.  Anticipatory guidance provided.

## 2016-11-17 NOTE — Progress Notes (Addendum)
Subjective:   Angel Waller is a 71 y.o. female who presents for Medicare Annual (Subsequent) preventive examination.  Review of Systems:  No ROS.  Medicare Wellness Visit. Additional risk factors are reflected in the social history.  Cardiac Risk Factors include: family history of premature cardiovascular disease;advanced age (>23men, >67 women);dyslipidemia   Sleep patterns: Sleeps about 8 hours, feels rested.   Home Safety/Smoke Alarms: Feels safe in home. Smoke alarms in place.  Living environment; residence and Firearm Safety: Lives with husband in 2 story home.  Seat Belt Safety/Bike Helmet: Wears seat belt.   Female:   Pap-2017. Followed by GYN.      Mammo-10/10/2016, patient reports normal. Will call for report.       Dexa scan-07/22/2011. Declines testing. Followed by GYN.         [facility]-Colonoscopy 08/17/2013, polyps. Due now. Will call to schedule.      Objective:     Vitals: BP (!) 116/58 (BP Location: Left Arm, Patient Position: Sitting, Cuff Size: Normal)   Pulse 64   Temp 98.2 F (36.8 C)   Resp 18   Ht 5\' 4"  (1.626 m)   Wt 134 lb 6.4 oz (61 kg)   LMP  (LMP Unknown)   SpO2 98%   BMI 23.07 kg/m   Body mass index is 23.07 kg/m.   Tobacco History  Smoking Status  . Former Smoker  . Packs/day: 0.50  . Years: 42.00  . Types: Cigarettes  . Quit date: 03/30/2007  Smokeless Tobacco  . Never Used    Comment: smoked ages [age], up to 1 pp WEEK     Counseling given: Not Answered   Past Medical History:  Diagnosis Date  . Arthritis    "right knee" (02/29/2012)  . Complete heart block ([place])   . Hyperlipemia   . Migraines    "occasionally" (02/29/2012)  . Pacemaker   . S/P AVR (aortic valve replacement)    July 2010  . Seasonal allergies    Past Surgical History:  Procedure Laterality Date  . CARDIAC VALVE REPLACEMENT  2010   tissue aortic valve; [facility]  . COLONOSCOPY  2005   negative; [place], [place]  . INSERT / REPLACE / REMOVE PACEMAKER    .  KNEE ARTHROSCOPY Right   . PERMANENT PACEMAKER INSERTION  03/02/2012   Medtronic Adapta L implanted by Dr Rayann Heman  . PERMANENT PACEMAKER INSERTION N/A 03/02/2012   Procedure: PERMANENT PACEMAKER INSERTION;  Surgeon: Thompson Grayer, MD;  Location[facility] C[facility] LAB;  Service: Cardiovascular;  Laterality: N/A;  . TEMPORARY PACEMAKER INSERTION N/A 03/01/2012   Procedure: TEMPORARY PACEMAKER INSERTION;  Surgeon: Burnell Blanks, MD;  Location: [facility] CATH LAB;  Service: Cardiovascular;  Laterality: N/A;  . [place]  . TOTAL KNEE ARTHROPLASTY Right 01/07/2014   Procedure: RIGHT TOTAL KNEE ARTHROPLASTY;  Surgeon: Mauri Pole, MD;  Location: [facility] ORS;  Service: Orthopedics;  Laterality: Right;   Family History  Problem Relation Age of Onset  . Heart attack Mother 39       CABG  . Heart attack Father 33       aneurysm post MI  . Colon polyps Father   . Heart disease Unknown        in both M & P uncles & aunts; no premature MI  . Diabetes Neg Hx   . Stroke Neg Hx   . Colon cancer Neg Hx   . Pancreatic cancer Neg Hx   . Rectal  cancer Neg Hx   . Stomach cancer Neg Hx    History  Sexual Activity  . Sexual activity: Yes    Outpatient Encounter Prescriptions as of 11/17/2016  Medication Sig  . atorvastatin (LIPITOR) 40 MG tablet TAKE 1 TABLET BY MOUTH DAILY  . BIOTIN PO Take by mouth.  . Cholecalciferol (D3-1000) 1000 UNITS capsule Take 2,000 Units by mouth daily.   Marland Kitchen estradiol (ESTRACE) 2 MG tablet Take 2 mg by mouth at bedtime.   . fluticasone (FLONASE) 50 MCG/ACT nasal spray Place 2 sprays into both nostrils daily.  . norethindrone (AYGESTIN) 5 MG tablet Take 2.5 mg by mouth. Once Every 4 months for 10 days  . omega-3 acid ethyl esters (LOVAZA) 1 G capsule Take 2 g by mouth at bedtime.  . Probiotic Product (SOLUBLE FIBER/PROBIOTICS PO) Take 1 capsule by mouth at bedtime.   . tretinoin microspheres (RETIN-A MICRO) 0.1 % gel Apply 1 application topically at bedtime.     Marland Kitchen Zoster Vac Recomb Adjuvanted [facility]) injection Inject 0.5 mLs into the muscle once.  . [DISCONTINUED] oseltamivir (TAMIFLU) 75 MG capsule Take 1 capsule (75 mg total) by mouth daily.   No facility-administered encounter medications on file as of 11/17/2016.     Activities of Daily Living In your present state of health, do you have any difficulty performing the following activities: 11/17/2016  Hearing? N  Vision? N  Difficulty concentrating or making decisions? N  Walking or climbing stairs? N  Dressing or bathing? N  Doing errands, shopping? N  Preparing Food and eating ? N  Using the Toilet? N  In the past six months, have you accidently leaked urine? N  Do you have problems with loss of bowel control? N  Managing your Medications? N  Managing your Finances? N  Housekeeping or managing your Housekeeping? N  Some recent data might be hidden    Patient Care Team: Midge Minium, MD as PCP - General (Family Medicine) Thompson Grayer, MD as Consulting Physician (Cardiology) Inda Castle, MD as Consulting Physician (Gastroenterology) Pleasant, Robley Fries., MD as Referring Physician (Obstetrics and Gynecology) Mackie Pai, MD as Referring Physician (Cardiology) Martinique, Amy, MD as Consulting Physician (Dermatology)    Assessment:    Physical assessment deferred to PCP.  Exercise Activities and Dietary recommendations Current Exercise Habits: Structured exercise class, Type of exercise: yoga, Time (Minutes): 60, Frequency (Times/Week): 2, Weekly Exercise (Minutes/Week): 120, Exercise limited by: None identified   Diet (meal preparation, eat out, water intake, caffeinated beverages, dairy products, fruits and vegetables): Drinks diet soda, water, coffee and shakes.   Breakfast: protein shake; boiled egg Lunch: salad; shake Dinner: protein and vegetables.   Goals    . patient           Maintain current health by staying active.       Fall  Risk Fall Risk  11/17/2016 07/03/2015 07/01/2014 06/13/2013 05/17/2012  Falls in the past year? No No No No No   Depression Screen PHQ 2/9 Scores 11/17/2016 07/03/2015 07/01/2014 06/13/2013  PHQ - 2 Score 0 0 0 0     Cognitive Function       Ad8 score reviewed for issues:  Issues making decisions: no  Less interest in hobbies / activities: no  Repeats questions, stories (family complaining): no  Trouble using ordinary gadgets (microwave, computer, phone): no  Forgets the month or year: no  Mismanaging finances: no  Remembering appts: no  Daily problems with thinking and/or memory: no  Ad8 score is=0     Immunization History  Administered Date(s) Administered  . Influenza Split 03/02/2011  . Influenza Whole 02/24/2010  . Influenza,inj,Quad PF,6+ Mos 02/26/2014, 12/31/2014, 01/06/2016  . Influenza-Unspecified 01/12/2012, 12/27/2012  . Pneumococcal Conjugate-13 07/01/2014  . Pneumococcal Polysaccharide-23 03/03/2012  . Tdap 02/29/2012   Shingrix Rx sent to pharmacy  Screening Tests Health Maintenance  Topic Date Due  . COLONOSCOPY  08/17/2016  . PAP SMEAR  08/27/2016  . INFLUENZA VACCINE  10/27/2016  . Hepatitis C Screening  12/27/2016 (Originally 1946/01/27)  . MAMMOGRAM  04/03/2017  . TETANUS/TDAP  02/28/2022  . DEXA SCAN  Completed  . PNA vac Low Risk Adult  Completed   Pt to call to schedule colonoscopy.      Plan:    Schedule colonoscopy  Shingles vaccine at pharmacy.  Bring a copy of your living will and/or healthcare power of attorney to your next office visit.   Continue doing brain stimulating activities (puzzles, reading, adult coloring books, staying active) to keep memory sharp.     I have personally reviewed and noted the following in the patient's chart:   . Medical and social history . Use of alcohol, tobacco or illicit drugs  . Current medications and supplements . Functional ability and status . Nutritional status . Physical  activity . Advanced directives . List of other physicians . Hospitalizations, surgeries, and ER visits in previous 12 months . Vitals . Screenings to include cognitive, depression, and falls . Referrals and appointments  In addition, I have reviewed and discussed with patient certain preventive protocols, quality metrics, and best practice recommendations. A written personalized care plan for preventive services as well as general preventive health recommendations were provided to patient.     Gerilyn Nestle, RN  11/17/2016  Reviewed documentation provided by RN and agree w/ above.  Annye Asa, MD

## 2016-11-17 NOTE — Progress Notes (Signed)
   Subjective:    Patient ID: Angel Waller, female    DOB: [birth year], 71 y.o.   MRN: [id number]  HPI CPE- Pt is due for repeat colonoscopy (plans to schedule).  UTD on mammo.  Declines DEXA.  UTD on immunizations.   Review of Systems Patient reports no vision/ hearing changes, adenopathy,fever, weight change,  persistant/recurrent hoarseness , swallowing issues, chest pain, palpitations, edema, persistant/recurrent cough, hemoptysis, dyspnea (rest/exertional/paroxysmal nocturnal), gastrointestinal bleeding (melena, rectal bleeding), abdominal pain, significant heartburn, bowel changes, GU symptoms (dysuria, hematuria, incontinence), Gyn symptoms (abnormal  bleeding, pain),  syncope, focal weakness, memory loss, numbness & tingling, skin/hair/nail changes, abnormal bruising or bleeding, anxiety, or depression.     Objective:   Physical Exam General Appearance:    Alert, cooperative, no distress, appears stated age  Head:    Normocephalic, without obvious abnormality, atraumatic  Eyes:    PERRL, conjunctiva/corneas clear, EOM's intact, fundi    benign, both eyes  Ears:    Normal TM's and external ear canals, both ears  Nose:   Nares normal, septum midline, mucosa normal, no drainage    or sinus tenderness  Throat:   Lips, mucosa, and tongue normal; teeth and gums normal  Neck:   Supple, symmetrical, trachea midline, no adenopathy;    Thyroid: no enlargement/tenderness/nodules  Back:     Symmetric, no curvature, ROM normal, no CVA tenderness  Lungs:     Clear to auscultation bilaterally, respirations unlabored  Chest Wall:    No tenderness or deformity   Heart:    Regular rate and rhythm, S1 and S2 normal, III/VI SEM, no rub or gallop  Breast Exam:    Deferred to GYN  Abdomen:     Soft, non-tender, bowel sounds active all four quadrants,    no masses, no organomegaly  Genitalia:    Deferred to GYN  Rectal:    Extremities:   Extremities normal, atraumatic, no cyanosis or edema  Pulses:    2+ and symmetric all extremities  Skin:   Skin color, texture, turgor normal, no rashes or lesions  Lymph nodes:   Cervical, supraclavicular, and axillary nodes normal  Neurologic:   CNII-XII intact, normal strength, sensation and reflexes    throughout          Assessment & Plan:

## 2016-11-17 NOTE — Patient Instructions (Addendum)
Follow up in 6 months to recheck cholesterol We'll notify you of your lab results and make any changes if needed Continue to work on healthy diet and regular exercise- you look great!!! Call and schedule your colonoscopy!   Call with any questions or concerns Enjoy the wedding!!!  Schedule colonoscopy  Shingles vaccine at pharmacy.  Bring a copy of your living will and/or healthcare power of attorney to your next office visit.   Continue doing brain stimulating activities (puzzles, reading, adult coloring books, staying active) to keep memory sharp.    RE: MyChart  Dear [name]  We are excited to introduce MyChart, a new best-in-class service that provides you online access to important information in your electronic medical record. We want to make it easier for you to view your health information - all in one secure location - when and where you need it. We expect MyChart will enhance the quality of care and service we provide. Use the activation code below to enroll in MyChart online at [email or web address]  When you register for MyChart, you can:  Marland Kitchen View your test results. . Communicate securely with your physician's office.  . View your medical history, allergies, medications, and immunizations. . Conveniently print information such as your medication lists.  If you are age 15 or older and want a member of your family to have access to your record, you must provide written consent by completing a proxy form available at our facility. Please speak to our clinical staff about guidelines regarding accounts for patients younger than age 20.  As you activate your MyChart account and need any technical assistance, please call the MyChart technical support line at [phone]) or email your question to [email or web address]. If you email your question(s), please include your name, a return phone number and the best time to reach you.  Thank you for using  MyChart as your new health and wellness resource!  MyChart Activation Code:  [email or web address] Expires: 01/16/2017  9:00 AM   Health Maintenance, Female Adopting a healthy lifestyle and getting preventive care can go a long way to promote health and wellness. Talk with your health care provider about what schedule of regular examinations is right for you. This is a good chance for you to check in with your provider about disease prevention and staying healthy. In between checkups, there are plenty of things you can do on your own. Experts have done a lot of research about which lifestyle changes and preventive measures are most likely to keep you healthy. Ask your health care provider for more information. Weight and diet Eat a healthy diet  Be sure to include plenty of vegetables, fruits, low-fat dairy products, and lean protein.  Do not eat a lot of foods high in solid fats, added sugars, or salt.  Get regular exercise. This is one of the most important things you can do for your health. ? Most adults should exercise for at least 150 minutes each week. The exercise should increase your heart rate and make you sweat (moderate-intensity exercise). ? Most adults should also do strengthening exercises at least twice a week. This is in addition to the moderate-intensity exercise.  Maintain a healthy weight  Body mass index (BMI) is a measurement that can be used to identify possible weight problems. It estimates body fat based on height and weight. Your health care provider can help determine your BMI and help you achieve or maintain a healthy weight.  For females 20 years of  age and older: ? A BMI below 18.5 is considered underweight. ? A BMI of 18.5 to 24.9 is normal. ? A BMI of 25 to 29.9 is considered overweight. ? A BMI of 30 and above is considered obese.  Watch levels of cholesterol and blood lipids  You should start having your blood tested for lipids and cholesterol at 72 years  of age, then have this test every 5 years.  You may need to have your cholesterol levels checked more often if: ? Your lipid or cholesterol levels are high. ? You are older than 72 years of age. ? You are at high risk for heart disease.  Cancer screening Lung Cancer  Lung cancer screening is recommended for adults 35-27 years old who are at high risk for lung cancer because of a history of smoking.  A yearly low-dose CT scan of the lungs is recommended for people who: ? Currently smoke. ? Have quit within the past 15 years. ? Have at least a 30-pack-year history of smoking. A pack year is smoking an average of one pack of cigarettes a day for 1 year.  Yearly screening should continue until it has been 15 years since you quit.  Yearly screening should stop if you develop a health problem that would prevent you from having lung cancer treatment.  Breast Cancer  Practice breast self-awareness. This means understanding how your breasts normally appear and feel.  It also means doing regular breast self-exams. Let your health care provider know about any changes, no matter how small.  If you are in your 20s or 30s, you should have a clinical breast exam (CBE) by a health care provider every 1-3 years as part of a regular health exam.  If you are 63 or older, have a CBE every year. Also consider having a breast X-ray (mammogram) every year.  If you have a family history of breast cancer, talk to your health care provider about genetic screening.  If you are at high risk for breast cancer, talk to your health care provider about having an MRI and a mammogram every year.  Breast cancer gene (BRCA) assessment is recommended for women who have family members with BRCA-related cancers. BRCA-related cancers include: ? Breast. ? Ovarian. ? Tubal. ? Peritoneal cancers.  Results of the assessment will determine the need for genetic counseling and BRCA1 and BRCA2 testing.  Cervical  Cancer Your health care provider may recommend that you be screened regularly for cancer of the pelvic organs (ovaries, uterus, and vagina). This screening involves a pelvic examination, including checking for microscopic changes to the surface of your cervix (Pap test). You may be encouraged to have this screening done every 3 years, beginning at age 58.  For women ages 77-65, health care providers may recommend pelvic exams and Pap testing every 3 years, or they may recommend the Pap and pelvic exam, combined with testing for human papilloma virus (HPV), every 5 years. Some types of HPV increase your risk of cervical cancer. Testing for HPV may also be done on women of any age with unclear Pap test results.  Other health care providers may not recommend any screening for nonpregnant women who are considered low risk for pelvic cancer and who do not have symptoms. Ask your health care provider if a screening pelvic exam is right for you.  If you have had past treatment for cervical cancer or a condition that could lead to cancer, you need Pap tests and screening for cancer  for at least 20 years after your treatment. If Pap tests have been discontinued, your risk factors (such as having a new sexual partner) need to be reassessed to determine if screening should resume. Some women have medical problems that increase the chance of getting cervical cancer. In these cases, your health care provider may recommend more frequent screening and Pap tests.  Colorectal Cancer  This type of cancer can be detected and often prevented.  Routine colorectal cancer screening usually begins at 72 years of age and continues through 72 years of age.  Your health care provider may recommend screening at an earlier age if you have risk factors for colon cancer.  Your health care provider may also recommend using home test kits to check for hidden blood in the stool.  A small camera at the end of a tube can be used to  examine your colon directly (sigmoidoscopy or colonoscopy). This is done to check for the earliest forms of colorectal cancer.  Routine screening usually begins at age 2.  Direct examination of the colon should be repeated every 5-10 years through 72 years of age. However, you may need to be screened more often if early forms of precancerous polyps or small growths are found.  Skin Cancer  Check your skin from head to toe regularly.  Tell your health care provider about any new moles or changes in moles, especially if there is a change in a mole's shape or color.  Also tell your health care provider if you have a mole that is larger than the size of a pencil eraser.  Always use sunscreen. Apply sunscreen liberally and repeatedly throughout the day.  Protect yourself by wearing long sleeves, pants, a wide-brimmed hat, and sunglasses whenever you are outside.  Heart disease, diabetes, and high blood pressure  High blood pressure causes heart disease and increases the risk of stroke. High blood pressure is more likely to develop in: ? People who have blood pressure in the high end of the normal range (130-139/85-89 mm Hg). ? People who are overweight or obese. ? People who are African American.  If you are 69-40 years of age, have your blood pressure checked every 3-5 years. If you are 63 years of age or older, have your blood pressure checked every year. You should have your blood pressure measured twice-once when you are at a hospital or clinic, and once when you are not at a hospital or clinic. Record the average of the two measurements. To check your blood pressure when you are not at a hospital or clinic, you can use: ? An automated blood pressure machine at a pharmacy. ? A home blood pressure monitor.  If you are between 21 years and 28 years old, ask your health care provider if you should take aspirin to prevent strokes.  Have regular diabetes screenings. This involves taking a  blood sample to check your fasting blood sugar level. ? If you are at a normal weight and have a low risk for diabetes, have this test once every three years after 72 years of age. ? If you are overweight and have a high risk for diabetes, consider being tested at a younger age or more often. Preventing infection Hepatitis B  If you have a higher risk for hepatitis B, you should be screened for this virus. You are considered at high risk for hepatitis B if: ? You were born in a country where hepatitis B is common. Ask your health care  provider which countries are considered high risk. ? Your parents were born in a high-risk country, and you have not been immunized against hepatitis B (hepatitis B vaccine). ? You have HIV or AIDS. ? You use needles to inject street drugs. ? You live with someone who has hepatitis B. ? You have had sex with someone who has hepatitis B. ? You get hemodialysis treatment. ? You take certain medicines for conditions, including cancer, organ transplantation, and autoimmune conditions.  Hepatitis C  Blood testing is recommended for: ? Everyone born from [birth year] through 1966. ? Anyone with known risk factors for hepatitis C.  Sexually transmitted infections (STIs)  You should be screened for sexually transmitted infections (STIs) including gonorrhea and chlamydia if: ? You are sexually active and are younger than 72 years of age. ? You are older than 72 years of age and your health care provider tells you that you are at risk for this type of infection. ? Your sexual activity has changed since you were last screened and you are at an increased risk for chlamydia or gonorrhea. Ask your health care provider if you are at risk.  If you do not have HIV, but are at risk, it may be recommended that you take a prescription medicine daily to prevent HIV infection. This is called pre-exposure prophylaxis (PrEP). You are considered at risk if: ? You are sexually active and  do not regularly use condoms or know the HIV status of your partner(s). ? You take drugs by injection. ? You are sexually active with a partner who has HIV.  Talk with your health care provider about whether you are at high risk of being infected with HIV. If you choose to begin PrEP, you should first be tested for HIV. You should then be tested every 3 months for as long as you are taking PrEP. Pregnancy  If you are premenopausal and you may become pregnant, ask your health care provider about preconception counseling.  If you may become pregnant, take 400 to 800 micrograms (mcg) of folic acid every day.  If you want to prevent pregnancy, talk to your health care provider about birth control (contraception). Osteoporosis and menopause  Osteoporosis is a disease in which the bones lose minerals and strength with aging. This can result in serious bone fractures. Your risk for osteoporosis can be identified using a bone density scan.  If you are 57 years of age or older, or if you are at risk for osteoporosis and fractures, ask your health care provider if you should be screened.  Ask your health care provider whether you should take a calcium or vitamin D supplement to lower your risk for osteoporosis.  Menopause may have certain physical symptoms and risks.  Hormone replacement therapy may reduce some of these symptoms and risks. Talk to your health care provider about whether hormone replacement therapy is right for you. Follow these instructions at home:  Schedule regular health, dental, and eye exams.  Stay current with your immunizations.  Do not use any tobacco products including cigarettes, chewing tobacco, or electronic cigarettes.  If you are pregnant, do not drink alcohol.  If you are breastfeeding, limit how much and how often you drink alcohol.  Limit alcohol intake to no more than 1 drink per day for nonpregnant women. One drink equals 12 ounces of beer, 5 ounces of  wine, or 1 ounces of hard liquor.  Do not use street drugs.  Do not share needles.  Ask  your health care provider for help if you need support or information about quitting drugs.  Tell your health care provider if you often feel depressed.  Tell your health care provider if you have ever been abused or do not feel safe at home. This information is not intended to replace advice given to you by your health care provider. Make sure you discuss any questions you have with your health care provider. Document Released: 09/28/2010 Document Revised: 08/21/2015 Document Reviewed: 12/17/2014 Elsevier Interactive Patient Education  2018 [place]  [address] [place], [place] [address]

## 2016-11-18 ENCOUNTER — Encounter: Payer: Self-pay | Admitting: General Practice

## 2016-11-18 LAB — HEPATITIS C ANTIBODY: HCV Ab: NONREACTIVE

## 2017-01-26 ENCOUNTER — Ambulatory Visit (INDEPENDENT_AMBULATORY_CARE_PROVIDER_SITE_OTHER): Payer: Medicare Other | Admitting: *Deleted

## 2017-01-26 DIAGNOSIS — I442 Atrioventricular block, complete: Secondary | ICD-10-CM

## 2017-01-28 ENCOUNTER — Encounter: Payer: Self-pay | Admitting: Cardiology

## 2017-01-28 NOTE — Progress Notes (Signed)
Remote pacemaker transmission.   

## 2017-01-31 LAB — CUP PACEART REMOTE DEVICE CHECK
Battery Impedance: 278 Ohm
Battery Remaining Longevity: 109 mo
Battery Voltage: 2.8 V
Brady Statistic AP VS Percent: 0 %
Date Time Interrogation Session: 20181031140816
Implantable Lead Implant Date: 20131205
Implantable Lead Location: 753860
Implantable Lead Model: 5076
Implantable Lead Model: 5092
Implantable Pulse Generator Implant Date: 20131205
Lead Channel Impedance Value: 395 Ohm
Lead Channel Pacing Threshold Pulse Width: 0.4 ms
Lead Channel Pacing Threshold Pulse Width: 0.4 ms
Lead Channel Setting Pacing Amplitude: 2 V
Lead Channel Setting Pacing Amplitude: 2.5 V
MDC IDC LEAD IMPLANT DT: 20131205
MDC IDC LEAD LOCATION: 753859
MDC IDC MSMT LEADCHNL RA PACING THRESHOLD AMPLITUDE: 0.375 V
MDC IDC MSMT LEADCHNL RA SENSING INTR AMPL: 0.7 mV
MDC IDC MSMT LEADCHNL RV IMPEDANCE VALUE: 555 Ohm
MDC IDC MSMT LEADCHNL RV PACING THRESHOLD AMPLITUDE: 0.5 V
MDC IDC SET LEADCHNL RV PACING PULSEWIDTH: 0.4 ms
MDC IDC SET LEADCHNL RV SENSING SENSITIVITY: 2.8 mV
MDC IDC STAT BRADY AP VP PERCENT: 26 %
MDC IDC STAT BRADY AS VP PERCENT: 73 %
MDC IDC STAT BRADY AS VS PERCENT: 0 %

## 2017-02-25 ENCOUNTER — Other Ambulatory Visit: Payer: Self-pay | Admitting: Family Medicine

## 2017-03-09 ENCOUNTER — Telehealth: Payer: Self-pay | Admitting: *Deleted

## 2017-03-09 ENCOUNTER — Encounter: Payer: Self-pay | Admitting: Internal Medicine

## 2017-03-09 ENCOUNTER — Ambulatory Visit: Payer: Medicare Other | Admitting: Internal Medicine

## 2017-03-09 VITALS — BP 142/60 | HR 74 | Ht 64.0 in | Wt 131.8 lb

## 2017-03-09 DIAGNOSIS — Z950 Presence of cardiac pacemaker: Secondary | ICD-10-CM

## 2017-03-09 DIAGNOSIS — R5383 Other fatigue: Secondary | ICD-10-CM

## 2017-03-09 DIAGNOSIS — I442 Atrioventricular block, complete: Secondary | ICD-10-CM | POA: Diagnosis not present

## 2017-03-09 DIAGNOSIS — R0683 Snoring: Secondary | ICD-10-CM

## 2017-03-09 NOTE — Patient Instructions (Addendum)
Medication Instructions:   Your physician recommends that you continue on your current medications as directed. Please refer to the Current Medication list given to you today.   Labwork: None ordered   Testing/Procedures:  Your physician has recommended that you have a sleep study. This test records several body functions during sleep, including: brain activity, eye movement, oxygen and carbon dioxide blood levels, heart rate and rhythm, breathing rate and rhythm, the flow of air through your mouth and nose, snoring, body muscle movements, and chest and belly movement.     Follow-Up: Your physician wants you to follow-up in: 4 months with Dr. Rayann Heman.   You have been referred to Dr. Radford Pax for sleep apnea  Any Other Special Instructions Will Be Listed Below (If Applicable).       If you need a refill on your cardiac medications before your next appointment, please call your pharmacy.

## 2017-03-09 NOTE — Progress Notes (Signed)
PCP: Midge Minium, MD Primary Cardiologist:  Dr Michaelle Birks ([facility]) Primary EP:  Dr Rayann Heman  Angel Waller is a 71 y.o. female who presents today for routine electrophysiology followup.  Since last being seen in our clinic, the patient reports doing very well.  She remains active.  She has some SOB on hills but none on level ground.  She snores and does not feel well rested in the morning.  She feels better as the day goes on. Today, she denies symptoms of palpitations, chest pain, shortness of breath,  lower extremity edema, dizziness, presyncope, or syncope.  The patient is otherwise without complaint today.   Past Medical History:  Diagnosis Date  . Arthritis    "right knee" (02/29/2012)  . Complete heart block ([place])   . Hyperlipemia   . Migraines    "occasionally" (02/29/2012)  . Pacemaker   . S/P AVR (aortic valve replacement)    July 2010  . Seasonal allergies    Past Surgical History:  Procedure Laterality Date  . CARDIAC VALVE REPLACEMENT  2010   tissue aortic valve; [facility]  . COLONOSCOPY  2005   negative; [place], [place]  . INSERT / REPLACE / REMOVE PACEMAKER    . KNEE ARTHROSCOPY Right   . PERMANENT PACEMAKER INSERTION  03/02/2012   Medtronic Adapta L implanted by Dr Rayann Heman  . PERMANENT PACEMAKER INSERTION N/A 03/02/2012   Procedure: PERMANENT PACEMAKER INSERTION;  Surgeon: Thompson Grayer, MD;  Location: [facility] CATH LAB;  Service: Cardiovascular;  Laterality: N/A;  . TEMPORARY PACEMAKER INSERTION N/A 03/01/2012   Procedure: TEMPORARY PACEMAKER INSERTION;  Surgeon: Burnell Blanks, MD;  Location[facility] CATH LAB;  Service: Cardiovascular;  Laterality: N/A;  . [place]  . TOTAL KNEE ARTHROPLASTY Right 01/07/2014   Procedure: RIGHT TOTAL KNEE ARTHROPLASTY;  Surgeon: Mauri Pole, MD;  Location: [facility] ORS;  Service: Orthopedics;  Laterality: Right;    ROS- all systems are reviewed and negative except as per HPI above  Current Outpatient  Medications  Medication Sig Dispense Refill  . aspirin EC 81 MG tablet Take 81 mg by mouth daily.    Marland Kitchen atorvastatin (LIPITOR) 40 MG tablet TAKE 1 TABLET BY MOUTH DAILY 30 tablet 6  . Cholecalciferol (D3-1000) 1000 UNITS capsule Take 2,000 Units by mouth daily.     . clindamycin (CLEOCIN) 300 MG capsule Take 600 mg by mouth as directed. For dental only    . estradiol (ESTRACE) 2 MG tablet Take 2 mg by mouth at bedtime.     . norethindrone (AYGESTIN) 5 MG tablet Take 2.5 mg by mouth. Once Every 4 months for 10 days    . omega-3 acid ethyl esters (LOVAZA) 1 G capsule Take 2 g by mouth at bedtime.    . Probiotic Product (SOLUBLE FIBER/PROBIOTICS PO) Take 1 capsule by mouth at bedtime.     . tretinoin microspheres (RETIN-A MICRO) 0.1 % gel Apply 1 application topically at bedtime.      No current facility-administered medications for this visit.     Physical Exam: Vitals:   03/09/17 1050  BP: (!) 142/60  Pulse: 74  SpO2: 97%  Weight: 131 lb 12.8 oz (59.8 kg)  Height: 5\' 4"  (1.626 m)    GEN- The patient is well appearing, alert and oriented x 3 today.   Head- normocephalic, atraumatic Eyes-  Sclera clear, conjunctiva pink Ears- hearing intact Oropharynx- clear Lungs- Clear to ausculation bilaterally, normal work of breathing Chest- pacemaker pocket  is well healed Heart- Regular rate and rhythm, 2/6 SEM LUSB, 2/6 diastolic murmur LUSB GI- soft, NT, ND, + BS Extremities- no clubbing, cyanosis, or edema  Pacemaker interrogation- reviewed in detail today,  See PACEART report  ekg tracing ordered today is personally reviewed and shows sinus rhythm with V pacing  Assessment and Plan:  1. Symptomatic complete heart block Normal pacemaker function See Pace Art report Will turn MVP off today She paces 100% of the time.  I have read Dr Raphael Gibney note today and share his concerns about RV pacing causing ventricular dysfunction.  Today, we discussed upgrade to CRT-P at length.  She would  like to wait until she has her echo in February.  If her echo worsens then she may be more ready to consider at that time.  2. NSVT Rare and short No changes today  3. S/p AVR Followed by Dr Michaelle Birks  4. Snoring/ fatigue Sleep study is ordered  [facility] Return to see me in 4 months (after repeat echo and follow-up with Dr Michaelle Birks)  Thompson Grayer MD, [facility] 03/09/2017 11:16 AM

## 2017-03-10 LAB — CUP PACEART INCLINIC DEVICE CHECK
Battery Remaining Longevity: 99 mo
Battery Voltage: 2.8 V
Brady Statistic AS VP Percent: 74 %
Implantable Lead Implant Date: 20131205
Implantable Lead Location: 753860
Implantable Lead Model: 5076
Implantable Lead Model: 5092
Implantable Pulse Generator Implant Date: 20131205
Lead Channel Impedance Value: 411 Ohm
Lead Channel Pacing Threshold Amplitude: 0.375 V
Lead Channel Pacing Threshold Pulse Width: 0.4 ms
Lead Channel Setting Pacing Amplitude: 2 V
Lead Channel Setting Pacing Pulse Width: 0.4 ms
MDC IDC LEAD IMPLANT DT: 20131205
MDC IDC LEAD LOCATION: 753859
MDC IDC MSMT BATTERY IMPEDANCE: 302 Ohm
MDC IDC MSMT LEADCHNL RA PACING THRESHOLD PULSEWIDTH: 0.4 ms
MDC IDC MSMT LEADCHNL RV IMPEDANCE VALUE: 569 Ohm
MDC IDC MSMT LEADCHNL RV PACING THRESHOLD AMPLITUDE: 0.5 V
MDC IDC SESS DTM: 20181212163140
MDC IDC SET LEADCHNL RV PACING AMPLITUDE: 2.5 V
MDC IDC SET LEADCHNL RV SENSING SENSITIVITY: 2.8 mV
MDC IDC STAT BRADY AP VP PERCENT: 26 %
MDC IDC STAT BRADY AP VS PERCENT: 0 %
MDC IDC STAT BRADY AS VS PERCENT: 0 %

## 2017-03-15 NOTE — Telephone Encounter (Signed)
Informed patient of upcoming sleep study and patient understanding was verbalized. Patient understands her sleep study is scheduled for scheduled for Sunday April 17 2017. Patient understands her sleep study will be done at [facility] sleep lab. Patient understands she will receive a sleep packet in a week or so. Patient understands to call if she does not receive the sleep packet in a timely manner. Patient agrees with treatment and thanked me for call.

## 2017-04-17 ENCOUNTER — Ambulatory Visit (HOSPITAL_BASED_OUTPATIENT_CLINIC_OR_DEPARTMENT_OTHER): Payer: Medicare Other | Attending: Internal Medicine | Admitting: Cardiology

## 2017-04-17 VITALS — Ht 64.0 in | Wt 130.0 lb

## 2017-04-17 DIAGNOSIS — R0683 Snoring: Secondary | ICD-10-CM

## 2017-04-17 DIAGNOSIS — G4761 Periodic limb movement disorder: Secondary | ICD-10-CM

## 2017-04-17 DIAGNOSIS — R5383 Other fatigue: Secondary | ICD-10-CM | POA: Diagnosis not present

## 2017-04-18 NOTE — Procedures (Signed)
   NAME: Angel Waller DATE OF BIRTH:  19-Dec-1945 MEDICAL RECORD NUMBER [id number]  LOCATION: [facility]  PHYSICIAN: Karah Caruthers  DATE OF STUDY: 04/17/2017  SLEEP STUDY TYPE: Nocturnal Polysomnogram               REFERRING PHYSICIAN: Thompson Grayer, MD   CLINICAL INFORMATION Sleep Study Type: NPSG  Indication for sleep study: Fatigue, Snoring, Witnessed Apneas  Epworth Sleepiness Score: 7  SLEEP STUDY TECHNIQUE As per the AASM Manual for the Scoring of Sleep and Associated Events v2.3 (April 2016) with a hypopnea requiring 4% desaturations.  The channels recorded and monitored were frontal, central and occipital EEG, electrooculogram (EOG), submentalis EMG (chin), nasal and oral airflow, thoracic and abdominal wall motion, anterior tibialis EMG, snore microphone, electrocardiogram, and pulse oximetry.  MEDICATIONS Medications self-administered by patient taken the night of the study : N/A  SLEEP ARCHITECTURE The study was initiated at 10:36:58 PM and ended at 4:58:05 AM.  Sleep onset time was 37.9 minutes and the sleep efficiency was 76.7%. The total sleep time was 292.5 minutes.  Stage REM latency was 167.5 minutes.  The patient spent 26.32% of the night in stage N1 sleep, 66.67% in stage N2 sleep, 0.00% in stage N3 and 7.01% in REM.  Alpha intrusion was absent.  Supine sleep was 19.66%.  RESPIRATORY PARAMETERS The overall apnea/hypopnea index (AHI) was 0.4 per hour. There were 0 total apneas, including 0 obstructive, 0 central and 0 mixed apneas. There were 2 hypopneas and 0 RERAs.  The AHI during Stage REM sleep was 0.0 per hour.  AHI while supine was 1.0 per hour.  The mean oxygen saturation was 93.24%. The minimum SpO2 during sleep was 88.00%.  soft snoring was noted during this study.  CARDIAC DATA The 2 lead EKG demonstrated sinus rhythm, pacemaker generated. The mean heart rate was 61.69 beats per minute. Other EKG findings include:  PVCs.  LEG MOVEMENT DATA The total PLMS were 144 with a resulting PLMS index of 29.54. Associated arousal with leg movement index was 0.0 .  IMPRESSIONS - No significant obstructive sleep apnea occurred during this study (AHI = 0.4/h). - No significant central sleep apnea occurred during this study (CAI = 0.0/h). - The patient had minimal or no oxygen desaturation during the study (Min O2 = 88.00%) - The patient snored with soft snoring volume. - EKG findings include PVCs. - Moderate periodic limb movements of sleep occurred during the study. No significant associated arousals.  DIAGNOSIS - Periodic Limb Movement disorder  RECOMMENDATIONS - Avoid alcohol, sedatives and other CNS depressants that may worsen sleep apnea and disrupt normal sleep architecture. - Sleep hygiene should be reviewed to assess factors that may improve sleep quality. - Weight management and regular exercise should be initiated or continued if appropriate. - Consider screening patient for symptoms of restless leg syndrome.  [place], American Board of Sleep Medicine  ELECTRONICALLY SIGNED ON:  04/18/2017, 5:20 PM [place] PH: [phone]   FX: [phone] [place]

## 2017-04-27 ENCOUNTER — Ambulatory Visit (INDEPENDENT_AMBULATORY_CARE_PROVIDER_SITE_OTHER): Payer: Medicare Other | Admitting: *Deleted

## 2017-04-27 DIAGNOSIS — I442 Atrioventricular block, complete: Secondary | ICD-10-CM | POA: Diagnosis not present

## 2017-04-27 NOTE — Progress Notes (Signed)
Remote pacemaker transmission.   

## 2017-04-28 ENCOUNTER — Encounter: Payer: Self-pay | Admitting: Cardiology

## 2017-05-03 ENCOUNTER — Telehealth: Payer: Self-pay | Admitting: *Deleted

## 2017-05-03 NOTE — Telephone Encounter (Signed)
Informed patient of sleep study results and patient understanding was verbalized. Patient understands her sleep study showed no significant sleep apnea. Patient understands she did have increased limb movements during sleep. Patient understands a copy of her sleep study will be faxed to he PCP to screen patient for sx of restless legs syndrome. Patient agrees with treatment and thanked me for the call.

## 2017-05-03 NOTE — Telephone Encounter (Signed)
-----   Message from Sueanne Margarita, MD sent at 04/18/2017  5:22 PM EST ----- Please let patient know that sleep study showed no significant sleep apnea.  She did have increased limb movements during sleep.  Please forward results of study to PCP to screen patient for sx of restless legs syndrome

## 2017-05-04 ENCOUNTER — Telehealth: Payer: Self-pay | Admitting: *Deleted

## 2017-05-04 NOTE — Telephone Encounter (Signed)
Appt. Canceled today for 05/13/17.

## 2017-05-04 NOTE — Telephone Encounter (Signed)
Sleep Study is in [place].   Routing to PCP to make sure that she is aware that it is there.  Copied from [place] [phone]. Topic: General - Other >> May 04, 2017 10:29 AM Valla Leaver wrote: Reason for CRM: Gae Bon calling to confirm that the sleep study she faxed yesterday was received for the patient. Please call back to confirm or let her know if it needs to be re-faxed.

## 2017-05-04 NOTE — Telephone Encounter (Signed)
Called and advised Angel Waller that we received the fax and it has been reviewed.

## 2017-05-04 NOTE — Telephone Encounter (Signed)
-----   Message from Sueanne Margarita, MD sent at 05/03/2017  6:24 PM EST ----- Regarding: RE: leep referral She does not need to see me  Traci ----- Message ----- From: Freada Bergeron, CMA Sent: 05/03/2017   5:33 PM To: Sueanne Margarita, MD Subject: leep referral                                  Should we cancel her appt, for Feb 15 since she doe not have OSA? Please advise

## 2017-05-04 NOTE — Telephone Encounter (Signed)
Already reviewed study

## 2017-05-13 ENCOUNTER — Ambulatory Visit: Payer: Medicare Other | Admitting: Cardiology

## 2017-05-13 LAB — CUP PACEART REMOTE DEVICE CHECK
Battery Remaining Longevity: 97 mo
Brady Statistic AP VS Percent: 0 %
Brady Statistic AS VS Percent: 0 %
Date Time Interrogation Session: 20190130142426
Implantable Lead Implant Date: 20131205
Implantable Lead Location: 753860
Lead Channel Impedance Value: 552 Ohm
Lead Channel Pacing Threshold Amplitude: 0.5 V
Lead Channel Pacing Threshold Pulse Width: 0.4 ms
Lead Channel Pacing Threshold Pulse Width: 0.4 ms
Lead Channel Setting Sensing Sensitivity: 2.8 mV
MDC IDC LEAD IMPLANT DT: 20131205
MDC IDC LEAD LOCATION: 753859
MDC IDC MSMT BATTERY IMPEDANCE: 327 Ohm
MDC IDC MSMT BATTERY VOLTAGE: 2.79 V
MDC IDC MSMT LEADCHNL RA IMPEDANCE VALUE: 394 Ohm
MDC IDC MSMT LEADCHNL RA PACING THRESHOLD AMPLITUDE: 0.375 V
MDC IDC PG IMPLANT DT: 20131205
MDC IDC SET LEADCHNL RA PACING AMPLITUDE: 2 V
MDC IDC SET LEADCHNL RV PACING AMPLITUDE: 2.5 V
MDC IDC SET LEADCHNL RV PACING PULSEWIDTH: 0.4 ms
MDC IDC STAT BRADY AP VP PERCENT: 22 %
MDC IDC STAT BRADY AS VP PERCENT: 77 %

## 2017-05-18 ENCOUNTER — Ambulatory Visit: Payer: Medicare Other | Admitting: Family Medicine

## 2017-05-18 ENCOUNTER — Encounter: Payer: Self-pay | Admitting: Family Medicine

## 2017-05-18 ENCOUNTER — Encounter: Payer: Self-pay | Admitting: General Practice

## 2017-05-18 ENCOUNTER — Other Ambulatory Visit: Payer: Self-pay

## 2017-05-18 VITALS — BP 131/81 | HR 56 | Temp 98.0°F | Resp 16 | Ht 64.0 in | Wt 131.5 lb

## 2017-05-18 DIAGNOSIS — E785 Hyperlipidemia, unspecified: Secondary | ICD-10-CM | POA: Diagnosis not present

## 2017-05-18 LAB — LIPID PANEL
CHOLESTEROL: 171 mg/dL (ref 0–200)
HDL: 53.4 mg/dL (ref >39.00)
LDL CALC: 100 mg/dL — AB (ref 0–99)
NonHDL: 117.19
Total CHOL/HDL Ratio: 3
Triglycerides: 86 mg/dL (ref 0.0–149.0)
VLDL: 17.2 mg/dL (ref 0.0–40.0)

## 2017-05-18 LAB — HEPATIC FUNCTION PANEL
ALT: 14 U/L (ref 0–35)
AST: 26 U/L (ref 0–37)
Albumin: 3.9 g/dL (ref 3.5–5.2)
Alkaline Phosphatase: 47 U/L (ref 39–117)
Bilirubin, Direct: 0.1 mg/dL (ref 0.0–0.3)
Total Bilirubin: 0.6 mg/dL (ref 0.2–1.2)
Total Protein: 7 g/dL (ref 6.0–8.3)

## 2017-05-18 LAB — BASIC METABOLIC PANEL
BUN: 18 mg/dL (ref 6–23)
CHLORIDE: 105 meq/L (ref 96–112)
CO2: 30 mEq/L (ref 19–32)
CREATININE: 0.74 mg/dL (ref 0.40–1.20)
Calcium: 9.5 mg/dL (ref 8.4–10.5)
GFR: 81.93 mL/min (ref >60.00)
GLUCOSE: 92 mg/dL (ref 70–99)
POTASSIUM: 4.1 meq/L (ref 3.5–5.1)
Sodium: 139 mEq/L (ref 135–145)

## 2017-05-18 MED ORDER — ATORVASTATIN CALCIUM 40 MG PO TABS: 40.0000 mg | ORAL_TABLET | Freq: Every day | ORAL | 1 refills | Status: DC

## 2017-05-18 NOTE — Assessment & Plan Note (Signed)
Chronic problem.  Tolerating statin w/o difficulty.  Applauded efforts at healthy diet and regular exercise.  Check labs.  Adjust meds prn  

## 2017-05-18 NOTE — Patient Instructions (Signed)
Schedule your complete physical in 6 months and your Medicare Wellness Visit w/ Angel Waller at the same time [facility] notify you of your lab results and make any changes if needed Continue to work on healthy diet and regular exercise- you look great!!! Call with any questions or concerns Have a great week!!

## 2017-05-18 NOTE — Progress Notes (Signed)
   Subjective:    Patient ID: SHILOH SOUTHERN, female    DOB: [birth year], 72 y.o.   MRN: [id number]  HPI Hyperlipidemia- chronic problem, on Lipitor.  Denies CP, SOB, abd pain, N/V, myalgias.  Weight is stable.  Exercising regularly.   Review of Systems For ROS see HPI     Objective:   Physical Exam  Constitutional: She is oriented to person, place, and time. She appears well-developed and well-nourished. No distress.  HENT:  Head: Normocephalic and atraumatic.  Eyes: Conjunctivae and EOM are normal. Pupils are equal, round, and reactive to light.  Neck: Normal range of motion. Neck supple. No thyromegaly present.  Cardiovascular: Normal rate, regular rhythm, normal heart sounds and intact distal pulses.  No murmur heard. Pulmonary/Chest: Effort normal and breath sounds normal. No respiratory distress.  Abdominal: Soft. She exhibits no distension. There is no tenderness.  Musculoskeletal: She exhibits no edema.  Lymphadenopathy:    She has no cervical adenopathy.  Neurological: She is alert and oriented to person, place, and time.  Skin: Skin is warm and dry.  Psychiatric: She has a normal mood and affect. Her behavior is normal.  Vitals reviewed.         Assessment & Plan:

## 2017-05-18 NOTE — Addendum Note (Signed)
Addended by: Davis Gourd on: 05/18/2017 09:45 AM   Modules accepted: Orders

## 2017-05-29 ENCOUNTER — Other Ambulatory Visit: Payer: Self-pay | Admitting: Family Medicine

## 2017-06-17 ENCOUNTER — Encounter: Payer: Self-pay | Admitting: Internal Medicine

## 2017-06-29 ENCOUNTER — Encounter: Payer: Self-pay | Admitting: Internal Medicine

## 2017-06-29 ENCOUNTER — Ambulatory Visit: Payer: Medicare Other | Admitting: Internal Medicine

## 2017-06-29 VITALS — BP 131/59 | HR 72 | Ht 64.0 in | Wt 133.0 lb

## 2017-06-29 DIAGNOSIS — Z952 Presence of prosthetic heart valve: Secondary | ICD-10-CM | POA: Diagnosis not present

## 2017-06-29 DIAGNOSIS — I442 Atrioventricular block, complete: Secondary | ICD-10-CM | POA: Diagnosis not present

## 2017-06-29 DIAGNOSIS — I4729 Other ventricular tachycardia: Secondary | ICD-10-CM

## 2017-06-29 DIAGNOSIS — I472 Ventricular tachycardia: Secondary | ICD-10-CM | POA: Diagnosis not present

## 2017-06-29 DIAGNOSIS — Z950 Presence of cardiac pacemaker: Secondary | ICD-10-CM

## 2017-06-29 NOTE — Progress Notes (Signed)
PCP: Midge Minium, MD Primary Cardiologist:  Dr Michaelle Birks ([facility]) Primary EP:  Dr Rayann Heman  [name] is a 72 y.o. female who presents today for routine electrophysiology followup.  Since last being seen in our clinic, the patient reports doing very well.  Today, she denies symptoms of palpitations, chest pain, shortness of breath,  lower extremity edema, dizziness, presyncope, or syncope.  The patient is otherwise without complaint today.   Past Medical History:  Diagnosis Date  . Aortic stenosis   . Arthritis    "right knee" (02/29/2012)  . Bicuspid aortic valve 09/2008   STATUS POST BIOPROSTHETIC AORTIC VALVE REPLACEMENT  . Complete heart block ([place])   . Hyperlipemia   . Migraines    "occasionally" (02/29/2012)  . Osteopenia   . Pacemaker   . S/P AVR (aortic valve replacement)    July 2010  . Seasonal allergies    Past Surgical History:  Procedure Laterality Date  . CARDIAC VALVE REPLACEMENT  2010   tissue aortic valve; [facility]  . COLONOSCOPY  2005   negative; [place], [place]  . INSERT / REPLACE / REMOVE PACEMAKER    . KNEE ARTHROSCOPY Right   . PERMANENT PACEMAKER INSERTION  03/02/2012   Medtronic Adapta L implanted by Dr Rayann Heman  . PERMANENT PACEMAKER INSERTION N/A 03/02/2012   Procedure: PERMANENT PACEMAKER INSERTION;  Surgeon: Thompson Grayer, MD;  Location: [facility] CATH LAB;  Service: Cardiovascular;  Laterality: N/A;  . TEMPORARY PACEMAKER INSERTION N/A 03/01/2012   Procedure: TEMPORARY PACEMAKER INSERTION;  Surgeon: Burnell Blanks, MD;  Location: [facility] CATH LAB;  Service: Cardiovascular;  Laterality: N/A;  . [place]  . TOTAL KNEE ARTHROPLASTY Right 01/07/2014   Procedure: RIGHT TOTAL KNEE ARTHROPLASTY;  Surgeon: Mauri Pole, MD;  Location: [facility] ORS;  Service: Orthopedics;  Laterality: Right;    ROS- all systems are reviewed and negative except as per HPI above  Current Outpatient Medications  Medication Sig Dispense Refill  .  aspirin EC 81 MG tablet Take 81 mg by mouth daily.    Marland Kitchen atorvastatin (LIPITOR) 40 MG tablet TAKE 1 TABLET BY MOUTH DAILY 90 tablet 1  . Cholecalciferol (D3-1000) 1000 UNITS capsule Take 2,000 Units by mouth daily.     . clindamycin (CLEOCIN) 300 MG capsule Take 600 mg by mouth as directed. For dental only    . estradiol (ESTRACE) 2 MG tablet Take 2 mg by mouth at bedtime.     . norethindrone (AYGESTIN) 5 MG tablet Take 2.5 mg by mouth. Once Every 4 months for 10 days    . omega-3 acid ethyl esters (LOVAZA) 1 G capsule Take 2 g by mouth at bedtime.    . Probiotic Product (SOLUBLE FIBER/PROBIOTICS PO) Take 1 capsule by mouth at bedtime.     . tretinoin microspheres (RETIN-A MICRO) 0.1 % gel Apply 1 application topically at bedtime.      No current facility-administered medications for this visit.     Physical Exam: Vitals:   06/29/17 1239  BP: (!) 131/59  Pulse: 72  Weight: 60.3 kg (133 lb)  Height: 5\' 4"  (1.626 m)    GEN- The patient is well appearing, alert and oriented x 3 today.   Head- normocephalic, atraumatic Eyes-  Sclera clear, conjunctiva pink Ears- hearing intact Oropharynx- clear Lungs- Clear to ausculation bilaterally, normal work of breathing Chest- pacemaker pocket is well healed Heart- Regular rate and rhythm, no murmurs, rubs or gallops, PMI not laterally displaced GI-  soft, NT, ND, + BS Extremities- no clubbing, cyanosis, or edema  Pacemaker interrogation- reviewed in detail today,  See PACEART report  ekg tracing ordered today is personally reviewed and shows sinus with V pacing Dr Lily Lovings notes and echo are reviewed with her today  Assessment and Plan:  1. Symptomatic complete heart block Normal pacemaker function See Pace Art report No changes today She has had LV enlargement previously.  This has been stable for 2 years.  EF 45%.  Will follow conservatively for now along with Dr Michaelle Birks.  Consider upgrade to CRT-P if any worsening of her echo  2.  NSVT Rare and asymptomatic  3. S/p AVR Followed [place]   [facility] Return in a year Follow-up with Dr Michaelle Birks as scheduled  Thompson Grayer MD, [facility] 06/29/2017 12:47 PM

## 2017-06-29 NOTE — Patient Instructions (Signed)
Medication Instructions:  Your physician recommends that you continue on your current medications as directed. Please refer to the Current Medication list given to you today.  Labwork: None ordered.  Testing/Procedures: None ordered.  Follow-Up: Your physician wants you to follow-up in: one year with Dr. Rayann Heman.   You will receive a reminder letter in the mail two months in advance. If you don't receive a letter, please call our office to schedule the follow-up appointment.  Remote monitoring is used to monitor your Pacemaker from home. This monitoring reduces the number of office visits required to check your device to one time per year. It allows [place] to keep an eye on the functioning of your device to ensure it is working properly. You are scheduled for a device check from home on 07/27/2017. You may send your transmission at any time that day. If you have a wireless device, the transmission will be sent automatically. After your physician reviews your transmission, you will receive a postcard with your next transmission date.  Any Other Special Instructions Will Be Listed Below (If Applicable).  If you need a refill on your cardiac medications before your next appointment, please call your pharmacy.

## 2017-07-14 LAB — CUP PACEART INCLINIC DEVICE CHECK
Battery Voltage: 2.8 V
Brady Statistic AP VP Percent: 22 %
Brady Statistic AP VS Percent: 0 %
Brady Statistic AS VP Percent: 78 %
Date Time Interrogation Session: 20190403162329
Implantable Lead Implant Date: 20131205
Implantable Lead Location: 753860
Lead Channel Impedance Value: 566 Ohm
Lead Channel Pacing Threshold Amplitude: 0.75 V
Lead Channel Pacing Threshold Amplitude: 1 V
Lead Channel Pacing Threshold Pulse Width: 0.4 ms
Lead Channel Sensing Intrinsic Amplitude: 2 mV
MDC IDC LEAD IMPLANT DT: 20131205
MDC IDC LEAD LOCATION: 753859
MDC IDC MSMT BATTERY IMPEDANCE: 350 Ohm
MDC IDC MSMT BATTERY REMAINING LONGEVITY: 95 mo
MDC IDC MSMT LEADCHNL RA IMPEDANCE VALUE: 399 Ohm
MDC IDC MSMT LEADCHNL RV PACING THRESHOLD PULSEWIDTH: 0.4 ms
MDC IDC PG IMPLANT DT: 20131205
MDC IDC SET LEADCHNL RA PACING AMPLITUDE: 2 V
MDC IDC SET LEADCHNL RV PACING AMPLITUDE: 2.5 V
MDC IDC SET LEADCHNL RV PACING PULSEWIDTH: 0.4 ms
MDC IDC SET LEADCHNL RV SENSING SENSITIVITY: 2.8 mV
MDC IDC STAT BRADY AS VS PERCENT: 0 %

## 2017-07-27 ENCOUNTER — Ambulatory Visit (INDEPENDENT_AMBULATORY_CARE_PROVIDER_SITE_OTHER): Payer: Medicare Other | Admitting: *Deleted

## 2017-07-27 DIAGNOSIS — I442 Atrioventricular block, complete: Secondary | ICD-10-CM | POA: Diagnosis not present

## 2017-07-27 NOTE — Progress Notes (Signed)
Remote pacemaker transmission.   

## 2017-07-28 ENCOUNTER — Encounter: Payer: Self-pay | Admitting: Cardiology

## 2017-07-28 LAB — CUP PACEART REMOTE DEVICE CHECK
Brady Statistic AP VS Percent: 0 %
Brady Statistic AS VS Percent: 0 %
Date Time Interrogation Session: 20190501134231
Implantable Lead Implant Date: 20131205
Implantable Lead Implant Date: 20131205
Implantable Lead Location: 753859
Implantable Lead Location: 753860
Implantable Lead Model: 5092
Lead Channel Impedance Value: 562 Ohm
Lead Channel Pacing Threshold Amplitude: 0.625 V
Lead Channel Pacing Threshold Pulse Width: 0.4 ms
Lead Channel Pacing Threshold Pulse Width: 0.4 ms
Lead Channel Setting Sensing Sensitivity: 2.8 mV
MDC IDC MSMT BATTERY IMPEDANCE: 400 Ohm
MDC IDC MSMT BATTERY REMAINING LONGEVITY: 90 mo
MDC IDC MSMT BATTERY VOLTAGE: 2.79 V
MDC IDC MSMT LEADCHNL RA IMPEDANCE VALUE: 394 Ohm
MDC IDC MSMT LEADCHNL RA PACING THRESHOLD AMPLITUDE: 0.5 V
MDC IDC PG IMPLANT DT: 20131205
MDC IDC SET LEADCHNL RA PACING AMPLITUDE: 2 V
MDC IDC SET LEADCHNL RV PACING AMPLITUDE: 2.5 V
MDC IDC SET LEADCHNL RV PACING PULSEWIDTH: 0.4 ms
MDC IDC STAT BRADY AP VP PERCENT: 27 %
MDC IDC STAT BRADY AS VP PERCENT: 72 %

## 2017-09-16 LAB — HM MAMMOGRAPHY

## 2017-10-26 ENCOUNTER — Encounter: Payer: Self-pay | Admitting: Cardiology

## 2017-10-26 ENCOUNTER — Ambulatory Visit (INDEPENDENT_AMBULATORY_CARE_PROVIDER_SITE_OTHER): Payer: Medicare Other | Admitting: *Deleted

## 2017-10-26 DIAGNOSIS — I442 Atrioventricular block, complete: Secondary | ICD-10-CM | POA: Diagnosis not present

## 2017-10-26 NOTE — Progress Notes (Signed)
Remote pacemaker transmission.   

## 2017-11-10 LAB — CUP PACEART REMOTE DEVICE CHECK
Battery Remaining Longevity: 85 mo
Battery Voltage: 2.79 V
Brady Statistic AP VS Percent: 0 %
Date Time Interrogation Session: 20190731124207
Implantable Lead Location: 753859
Implantable Lead Model: 5092
Lead Channel Impedance Value: 586 Ohm
Lead Channel Pacing Threshold Amplitude: 0.625 V
Lead Channel Pacing Threshold Pulse Width: 0.4 ms
Lead Channel Setting Pacing Amplitude: 2.5 V
Lead Channel Setting Pacing Pulse Width: 0.4 ms
Lead Channel Setting Sensing Sensitivity: 2.8 mV
MDC IDC LEAD IMPLANT DT: 20131205
MDC IDC LEAD IMPLANT DT: 20131205
MDC IDC LEAD LOCATION: 753860
MDC IDC MSMT BATTERY IMPEDANCE: 473 Ohm
MDC IDC MSMT LEADCHNL RA IMPEDANCE VALUE: 399 Ohm
MDC IDC MSMT LEADCHNL RA PACING THRESHOLD AMPLITUDE: 0.5 V
MDC IDC MSMT LEADCHNL RA PACING THRESHOLD PULSEWIDTH: 0.4 ms
MDC IDC MSMT LEADCHNL RA SENSING INTR AMPL: 1 mV
MDC IDC PG IMPLANT DT: 20131205
MDC IDC SET LEADCHNL RA PACING AMPLITUDE: 2 V
MDC IDC STAT BRADY AP VP PERCENT: 29 %
MDC IDC STAT BRADY AS VP PERCENT: 70 %
MDC IDC STAT BRADY AS VS PERCENT: 1 %

## 2017-11-24 ENCOUNTER — Other Ambulatory Visit: Payer: Self-pay

## 2017-11-24 ENCOUNTER — Other Ambulatory Visit: Payer: Self-pay | Admitting: Medical

## 2017-11-24 ENCOUNTER — Ambulatory Visit: Payer: Medicare Other | Admitting: Medical

## 2017-11-24 ENCOUNTER — Encounter: Payer: Self-pay | Admitting: Medical

## 2017-11-24 VITALS — BP 118/54 | HR 60 | Temp 97.6°F | Resp 14 | Ht 64.0 in | Wt 136.0 lb

## 2017-11-24 DIAGNOSIS — H6122 Impacted cerumen, left ear: Secondary | ICD-10-CM | POA: Diagnosis not present

## 2017-11-24 DIAGNOSIS — J301 Allergic rhinitis due to pollen: Secondary | ICD-10-CM | POA: Diagnosis not present

## 2017-11-24 MED ORDER — FLUTICASONE PROPIONATE 50 MCG/ACT NA SUSP: 2.0000 | Freq: Every day | NASAL | 1 refills | Status: DC

## 2017-11-24 MED ORDER — LEVOCETIRIZINE DIHYDROCHLORIDE 5 MG PO TABS: 5.0000 mg | ORAL_TABLET | Freq: Every evening | ORAL | 3 refills | Status: DC

## 2017-11-24 NOTE — Addendum Note (Signed)
Addended by: Anabel Halon on: 11/24/2017 11:54 AM   Modules accepted: Level of Service

## 2017-11-24 NOTE — Patient Instructions (Signed)
The nurse did lavage her left ear and remove wax completely.  You do have slight redness to your tympanic membrane by think that is likely from the trauma related to the procedure.  If you start to have any ear pain over the next couple days please let me know and I will give you an antibiotic.  Also you appear to have allergic rhinitis presently.  I am going to prescribe Flonase and Xyzal.  Using both should resolve your allergy symptoms.  If you get any worse symptoms such as sinus pressure and let me know and would prescribe antibiotic as well.  Follow-up 7 days or as needed.

## 2017-11-24 NOTE — Progress Notes (Signed)
Pt. Requesting flonase rx.

## 2017-11-24 NOTE — Progress Notes (Signed)
Subjective:    Patient ID: Angel Waller, female    DOB: 01-07-1946, 72 y.o.   MRN: [id number]  HPI   Pt in with some mild left ear discomfort for 2 weeks. But then got worse last week.  She also reports some mild nasal congestion recently with sneezing and postnasal drainage.  He does report history of allergic rhinitis intermittently.  She has responded to Flonase in the past. Note history of spring allergies and now beginning to suspect fall allergies as well.  She did use some peroxide and Debrox-like product to her left ear for 3 days prior to today's visit.  She has known history of cerumen impaction and it gets a lavage about once a year per her report.  No recent fevers.   Review of Systems  Constitutional: Negative for chills, fatigue and fever.  HENT: Positive for congestion, rhinorrhea and sneezing.        Ear discomfort/blocking sensation.  Respiratory: Negative for cough, chest tightness, shortness of breath and wheezing.   Cardiovascular: Negative for chest pain and palpitations.  Gastrointestinal: Negative for abdominal pain.  Genitourinary: Negative for dysuria and frequency.  Musculoskeletal: Negative for back pain and myalgias.  Skin: Negative for rash.  Neurological: Negative for dizziness and headaches.  Hematological: Negative for adenopathy. Does not bruise/bleed easily.  Psychiatric/Behavioral: Negative for behavioral problems and confusion. The patient is not nervous/anxious.     Past Medical History:  Diagnosis Date  . Aortic stenosis   . Arthritis    "right knee" (02/29/2012)  . Bicuspid aortic valve 09/2008   STATUS POST BIOPROSTHETIC AORTIC VALVE REPLACEMENT  . Complete heart block ([place])   . Hyperlipemia   . Migraines    "occasionally" (02/29/2012)  . Osteopenia   . Pacemaker   . S/P AVR (aortic valve replacement)    July 2010  . Seasonal allergies      Social History   Socioeconomic History  . Marital status: Married    Spouse name:  Not on file  . Number of children: Not on file  . Years of education: Not on file  . Highest education level: Not on file  Occupational History  . Occupation: Chief Technology Officer  Social Needs  . Financial resource strain: Not on file  . Food insecurity:    Worry: Not on file    Inability: Not on file  . Transportation needs:    Medical: Not on file    Non-medical: Not on file  Tobacco Use  . Smoking status: Former Smoker    Packs/day: 0.50    Years: 42.00    Pack years: 21.00    Types: Cigarettes    Last attempt to quit: 03/30/2007    Years since quitting: 10.6  . Smokeless tobacco: Never Used  . Tobacco comment: smoked ages [age], up to 1 pp WEEK  Substance and Sexual Activity  . Alcohol use: No  . Drug use: No  . Sexual activity: Yes  Lifestyle  . Physical activity:    Days per week: Not on file    Minutes per session: Not on file  . Stress: Not on file  Relationships  . Social connections:    Talks on phone: Not on file    Gets together: Not on file    Attends religious service: Not on file    Active member of club or organization: Not on file    Attends meetings of clubs or organizations: Not on file    Relationship status:  Not on file  . Intimate partner violence:    Fear of current or ex partner: Not on file    Emotionally abused: Not on file    Physically abused: Not on file    Forced sexual activity: Not on file  Other Topics Concern  . Not on file  Social History Narrative  . Not on file    Past Surgical History:  Procedure Laterality Date  . CARDIAC VALVE REPLACEMENT  2010   tissue aortic valve; [facility]  . COLONOSCOPY  2005   negative; [place], [place]  . INSERT / REPLACE / REMOVE PACEMAKER    . KNEE ARTHROSCOPY Right   . PERMANENT PACEMAKER INSERTION  03/02/2012   Medtronic Adapta L implanted by Dr Rayann Heman  . PERMANENT PACEMAKER INSERTION N/A 03/02/2012   Procedure: PERMANENT PACEMAKER INSERTION;  Surgeon: Thompson Grayer, MD;  Location: [facility] CATH  LAB;  Service: Cardiovascular;  Laterality: N/A;  . TEMPORARY PACEMAKER INSERTION N/A 03/01/2012   Procedure: TEMPORARY PACEMAKER INSERTION;  Surgeon: Burnell Blanks, MD;  Location: [facility] CATH LAB;  Service: Cardiovascular;  Laterality: N/A;  . [place]  . TOTAL KNEE ARTHROPLASTY Right 01/07/2014   Procedure: RIGHT TOTAL KNEE ARTHROPLASTY;  Surgeon: Mauri Pole, MD;  Location: [facility] ORS;  Service: Orthopedics;  Laterality: Right;    Family History  Problem Relation Age of Onset  . Heart attack Mother 26       CABG  . Heart attack Father 70       aneurysm post MI  . Colon polyps Father   . Heart disease Unknown        in both M & P uncles & aunts; no premature MI  . Diabetes Neg Hx   . Stroke Neg Hx   . Colon cancer Neg Hx   . Pancreatic cancer Neg Hx   . Rectal cancer Neg Hx   . Stomach cancer Neg Hx     Allergies  Allergen Reactions  . Amoxicillin Swelling    Current Outpatient Medications on File Prior to Visit  Medication Sig Dispense Refill  . aspirin EC 81 MG tablet Take 81 mg by mouth daily.    Marland Kitchen atorvastatin (LIPITOR) 40 MG tablet TAKE 1 TABLET BY MOUTH DAILY 90 tablet 1  . Cholecalciferol (D3-1000) 1000 UNITS capsule Take 2,000 Units by mouth daily.     . clindamycin (CLEOCIN) 300 MG capsule Take 600 mg by mouth as directed. For dental only    . estradiol (ESTRACE) 2 MG tablet Take 2 mg by mouth at bedtime.     . norethindrone (AYGESTIN) 5 MG tablet Take 2.5 mg by mouth. Once Every 4 months for 10 days    . omega-3 acid ethyl esters (LOVAZA) 1 G capsule Take 2 g by mouth at bedtime.    . Probiotic Product (SOLUBLE FIBER/PROBIOTICS PO) Take 1 capsule by mouth at bedtime.     . tretinoin microspheres (RETIN-A MICRO) 0.1 % gel Apply 1 application topically at bedtime.      No current facility-administered medications on file prior to visit.     BP (!) 118/54 (BP Location: Right Arm, Patient Position: Sitting, Cuff Size: Normal)    Pulse 60   Temp 97.6 F (36.4 C) (Oral)   Resp 14   Ht 5\' 4"  (6.269 m)   Wt 136 lb (61.7 kg)   LMP  (LMP Unknown)   SpO2 98%   BMI 23.34 kg/m       Objective:  Physical Exam   General  Mental Status - Alert. General Appearance - Well groomed. Not in acute distress.  Skin Rashes- No Rashes.  HEENT Head- Normal. Ear Auditory Canal - Left- mild redn tm post lavage.. Right - Normal.Tympanic Membrane- Left- cerumen impaction blocking entire view of tm. Post lavage cleard.. Right- Normal. Eye Sclera/Conjunctiva- Left- Normal. Right- Normal. Nose & Sinuses Nasal Mucosa- Left-  Boggy and Congested. Right-  Boggy and  Congested.Bilateral no  maxillary and no  frontal sinus pressure. Mouth & Throat Lips: Upper Lip- Normal: no dryness, cracking, pallor, cyanosis, or vesicular eruption. Lower Lip-Normal: no dryness, cracking, pallor, cyanosis or vesicular eruption. Buccal Mucosa- Bilateral- No Aphthous ulcers. Oropharynx- No Discharge or Erythema. +pnd. Tonsils: Characteristics- Bilateral- No Erythema or Congestion. Size/Enlargement- Bilateral- No enlargement. Discharge- bilateral-None.  Neck Neck- Supple. No Masses.   Chest and Lung Exam Auscultation: Breath Sounds:-Clear even and unlabored.  Cardiovascular Auscultation:Rythm- Regular, rate and rhythm. Murmurs & Other Heart Sounds:Ausculatation of the heart reveal- No Murmurs.  Lymphatic Head & Neck General Head & Neck Lymphatics: Bilateral: Description- No Localized lymphadenopathy.          Assessment & Plan:  The nurse did lavage her left ear and remove wax completely.  You do have slight redness to your tympanic membrane by think that is likely from the trauma related to the procedure.  If you start to have any ear pain over the next couple days please let me know and I will give you an antibiotic.  Also you appear to have allergic rhinitis presently.  I am going to prescribe Flonase and Xyzal.  Using both  should resolve your allergy symptoms.  If you get any worse symptoms such as sinus pressure and let me know and would prescribe antibiotic as well.  Follow-up 7 days or as needed.  Mackie Pai, PA-C

## 2017-11-30 ENCOUNTER — Ambulatory Visit: Payer: Medicare Other

## 2017-11-30 ENCOUNTER — Encounter: Payer: Medicare Other | Admitting: Family Medicine

## 2017-12-01 ENCOUNTER — Ambulatory Visit: Payer: Medicare Other

## 2017-12-06 ENCOUNTER — Telehealth: Payer: Self-pay | Admitting: Emergency Medicine

## 2017-12-06 NOTE — Telephone Encounter (Signed)
Spoke with patient about exposure to Flu. She states she was having cold symptoms prior to exposure. Her symptoms started on Thursday with sore throat, no fever, some hoarseness and now cough. Taking Coricidin HBP, Ibprofen and Cold ezz. Advised she can be seen for symptoms. She will give another day and if symptoms doesn't improve will schedule an appointment.   Copied from [place] [phone]. Topic: General - Other >> Dec 06, 2017 12:31 PM Judyann Munson wrote: Reason for CRM: Patient is calling to request and nurse give her a call back in regards to being expose to the Flu type A on Saturday. Her best contact number is [phone].

## 2017-12-07 ENCOUNTER — Encounter: Payer: Self-pay | Admitting: Medical

## 2017-12-07 ENCOUNTER — Ambulatory Visit: Payer: Medicare Other | Admitting: Medical

## 2017-12-07 VITALS — BP 128/49 | HR 79 | Temp 97.7°F | Resp 16 | Ht 64.0 in | Wt 134.4 lb

## 2017-12-07 DIAGNOSIS — Z20828 Contact with and (suspected) exposure to other viral communicable diseases: Secondary | ICD-10-CM | POA: Diagnosis not present

## 2017-12-07 DIAGNOSIS — J0100 Acute maxillary sinusitis, unspecified: Secondary | ICD-10-CM

## 2017-12-07 DIAGNOSIS — R05 Cough: Secondary | ICD-10-CM

## 2017-12-07 DIAGNOSIS — J40 Bronchitis, not specified as acute or chronic: Secondary | ICD-10-CM

## 2017-12-07 DIAGNOSIS — R059 Cough, unspecified: Secondary | ICD-10-CM

## 2017-12-07 MED ORDER — HYDROCODONE-HOMATROPINE 5-1.5 MG/5ML PO SYRP: 5.0000 mL | ORAL_SOLUTION | Freq: Four times a day (QID) | ORAL | 0 refills | Status: DC | PRN

## 2017-12-07 MED ORDER — DOXYCYCLINE HYCLATE 100 MG PO TABS: 100.0000 mg | ORAL_TABLET | Freq: Two times a day (BID) | ORAL | 0 refills | Status: DC

## 2017-12-07 NOTE — Progress Notes (Signed)
Subjective:    Patient ID: Angel Waller, female    DOB: 11-Mar-1946, 72 y.o.   MRN: [id number]  HPI  Pt in with cough that is constant. She states has dry hacky cough keeping her up at night. She has tried various home remedies but not working. She is feeling a lot of pnd. Pt has been having some mild productive cough. Pt states her sinus do feel little tender. No wheezing reported.  Pt has been sneezing. This is despite the use of flonase ans xyzal.   No fever, no chills or sweats.   Some faint left upper teeth region discomfort recently.  Some possible exposure to flu type A recently.   Review of Systems  Constitutional: Negative for chills, fatigue and fever.  HENT: Positive for congestion, postnasal drip, rhinorrhea, sinus pressure, sinus pain and sneezing. Negative for ear pain.   Respiratory: Positive for cough. Negative for chest tightness, shortness of breath and wheezing.   Cardiovascular: Negative for chest pain and palpitations.  Gastrointestinal: Negative for abdominal pain.  Musculoskeletal: Negative for back pain, joint swelling and neck pain.  Skin: Negative for rash.  Neurological: Negative for dizziness and headaches.  Hematological: Negative for adenopathy. Does not bruise/bleed easily.  Psychiatric/Behavioral: Negative for behavioral problems and confusion.    Past Medical History:  Diagnosis Date  . Aortic stenosis   . Arthritis    "right knee" (02/29/2012)  . Bicuspid aortic valve 09/2008   STATUS POST BIOPROSTHETIC AORTIC VALVE REPLACEMENT  . Complete heart block ([place])   . Hyperlipemia   . Migraines    "occasionally" (02/29/2012)  . Osteopenia   . Pacemaker   . S/P AVR (aortic valve replacement)    July 2010  . Seasonal allergies      Social History   Socioeconomic History  . Marital status: Married    Spouse name: Not on file  . Number of children: Not on file  . Years of education: Not on file  . Highest education level: Not on file    Occupational History  . Occupation: Chief Technology Officer  Social Needs  . Financial resource strain: Not on file  . Food insecurity:    Worry: Not on file    Inability: Not on file  . Transportation needs:    Medical: Not on file    Non-medical: Not on file  Tobacco Use  . Smoking status: Former Smoker    Packs/day: 0.50    Years: 42.00    Pack years: 21.00    Types: Cigarettes    Last attempt to quit: 03/30/2007    Years since quitting: 10.6  . Smokeless tobacco: Never Used  . Tobacco comment: smoked ages [age], up to 1 pp WEEK  Substance and Sexual Activity  . Alcohol use: No  . Drug use: No  . Sexual activity: Yes  Lifestyle  . Physical activity:    Days per week: Not on file    Minutes per session: Not on file  . Stress: Not on file  Relationships  . Social connections:    Talks on phone: Not on file    Gets together: Not on file    Attends religious service: Not on file    Active member of club or organization: Not on file    Attends meetings of clubs or organizations: Not on file    Relationship status: Not on file  . Intimate partner violence:    Fear of current or ex partner: Not on file  Emotionally abused: Not on file    Physically abused: Not on file    Forced sexual activity: Not on file  Other Topics Concern  . Not on file  Social History Narrative  . Not on file    Past Surgical History:  Procedure Laterality Date  . CARDIAC VALVE REPLACEMENT  2010   tissue aortic valve; [facility]  . COLONOSCOPY  2005   negative; [place], [place]  . INSERT / REPLACE / REMOVE PACEMAKER    . KNEE ARTHROSCOPY Right   . PERMANENT PACEMAKER INSERTION  03/02/2012   Medtronic Adapta L implanted by Dr Rayann Heman  . PERMANENT PACEMAKER INSERTION N/A 03/02/2012   Procedure: PERMANENT PACEMAKER INSERTION;  Surgeon: Thompson Grayer, MD;  Location[facility] C[facility] LAB;  Service: Cardiovascular;  Laterality: N/A;  . TEMPORARY PACEMAKER INSERTION N/A 03/01/2012   Procedure: TEMPORARY  PACEMAKER INSERTION;  Surgeon: Burnell Blanks, MD;  [facility] C[facility] LAB;  Service: Cardiovascular;  Laterality: N/A;  . [place]  . TOTAL KNEE ARTHROPLASTY Right 01/07/2014   Procedure: RIGHT TOTAL KNEE ARTHROPLASTY;  Surgeon: Mauri Pole, MD;  Location: [facility] ORS;  Service: Orthopedics;  Laterality: Right;    Family History  Problem Relation Age of Onset  . Heart attack Mother 86       CABG  . Heart attack Father 60       aneurysm post MI  . Colon polyps Father   . Heart disease Unknown        in both M & P uncles & aunts; no premature MI  . Diabetes Neg Hx   . Stroke Neg Hx   . Colon cancer Neg Hx   . Pancreatic cancer Neg Hx   . Rectal cancer Neg Hx   . Stomach cancer Neg Hx     Allergies  Allergen Reactions  . Amoxicillin Swelling    Current Outpatient Medications on File Prior to Visit  Medication Sig Dispense Refill  . aspirin EC 81 MG tablet Take 81 mg by mouth daily.    Marland Kitchen atorvastatin (LIPITOR) 40 MG tablet TAKE 1 TABLET BY MOUTH DAILY 90 tablet 1  . Cholecalciferol (D3-1000) 1000 UNITS capsule Take 2,000 Units by mouth daily.     . clindamycin (CLEOCIN) 300 MG capsule Take 600 mg by mouth as directed. For dental only    . estradiol (ESTRACE) 2 MG tablet Take 2 mg by mouth at bedtime.     . fluticasone (FLONASE) 50 MCG/ACT nasal spray Place 2 sprays into both nostrils daily. 16 g 1  . levocetirizine (XYZAL) 5 MG tablet Take 1 tablet (5 mg total) by mouth every evening. 30 tablet 3  . norethindrone (AYGESTIN) 5 MG tablet Take 2.5 mg by mouth. Once Every 4 months for 10 days    . omega-3 acid ethyl esters (LOVAZA) 1 G capsule Take 2 g by mouth at bedtime.    . Probiotic Product (SOLUBLE FIBER/PROBIOTICS PO) Take 1 capsule by mouth at bedtime.     . tretinoin microspheres (RETIN-A MICRO) 0.1 % gel Apply 1 application topically at bedtime.      No current facility-administered medications on file prior to visit.     LMP  (LMP  Unknown)       Objective:   Physical Exam  General  Mental Status - Alert. General Appearance - Well groomed. Not in acute distress.  Skin Rashes- No Rashes.  HEENT Head- Normal. Ear Auditory Canal - Left- Normal. Right - Normal.Tympanic Membrane-  Left- Normal. Right- Normal. Eye Sclera/Conjunctiva- Left- Normal. Right- Normal. Nose & Sinuses Nasal Mucosa- Left-  Boggy and Congested. Right-  Boggy and  Congested.Faint left sided maxillary sinus pressure but no  frontal sinus pressure. Mouth & Throat Lips: Upper Lip- Normal: no dryness, cracking, pallor, cyanosis, or vesicular eruption. Lower Lip-Normal: no dryness, cracking, pallor, cyanosis or vesicular eruption. Buccal Mucosa- Bilateral- No Aphthous ulcers. Oropharynx- No Discharge or Erythema. +pnd. Tonsils: Characteristics- Bilateral- No Erythema or Congestion. Size/Enlargement- Bilateral- No enlargement. Discharge- bilateral-None.  Neck Neck- Supple. No Masses.   Chest and Lung Exam Auscultation: Breath Sounds:-Clear even and unlabored.  Cardiovascular Auscultation:Rythm- Regular, rate and rhythm. Murmurs & Other Heart Sounds:Ausculatation of the heart reveal- No Murmurs.  Lymphatic Head & Neck General Head & Neck Lymphatics: Bilateral: Description- No Localized lymphadenopathy.        Assessment & Plan:   You appear to have bronchitis and sinusitis following allergy symptoms. Rest hydrate and tylenol for fever. I am prescribing cough medicine hycodan, and doxycycline  antibiotic. For your nasal congestion continue flonase (recommend continue your xyzal as well)  If allergy component type symptoms were to worsen then consider depomedrol im injection.  You should gradually get better. If not then notify us and would recommend a chest xray.  Follow up in 7-10 days or as needed

## 2017-12-07 NOTE — Patient Instructions (Addendum)
You appear to have bronchitis and sinusitis following allergy symptoms. Rest hydrate and tylenol for fever. I am prescribing cough medicine hycodan, and doxycycline antibiotic. For your nasal congestion continue flonase (recomend continue your xyzal as well)  If allergy component type symptoms were to worsen then consider depomedrol im injection.  You should gradually get better. If not then notify us and would recommend a chest xray.  Follow up in 7-10 days or as needed

## 2017-12-13 NOTE — Progress Notes (Addendum)
Subjective:   Angel Waller is a 72 y.o. female who presents for Medicare Annual (Subsequent) preventive examination.  Review of Systems:  No ROS.  Medicare Wellness Visit. Additional risk factors are reflected in the social history.  Cardiac Risk Factors include: advanced age (>93men, >6 women);dyslipidemia;family history of premature cardiovascular disease   Sleep patterns: Sleeps 8-9 hours.  Home Safety/Smoke Alarms: Feels safe in home. Smoke alarms in place.  Living environment; residence and Firearm Safety: Lives with husband in 2 story home.  Seat Belt Safety/Bike Helmet: Wears seat belt.   Female:   Pap-N/A       Mammo-Pt reports 09/26/2017, pt reports normal. [facility].  Dexa scan-07/22/2011. Declines testing.          [facility]-Colonoscopy 08/17/2013, polyps. Recall 3 years. Referral placed.      Objective:     Vitals: BP (!) 128/58 (BP Location: Left Arm, Patient Position: Sitting, Cuff Size: Normal)   Pulse 67   Temp 97.9 F (36.6 C) (Temporal)   Resp 16   Ht 5\' 4"  (1.626 m)   Wt 137 lb 8 oz (62.4 kg)   LMP  (LMP Unknown)   SpO2 98%   BMI 23.60 kg/m   Body mass index is 23.6 kg/m.  Advanced Directives 12/14/2017 04/17/2017 11/17/2016 03/18/2014 01/07/2014 01/07/2014 12/26/2013  Does Patient Have a Medical Advance Directive? Yes No Yes No Yes Yes Yes  Type of Advance Directive Living will;Healthcare Power of [place];Living will - [place];Living will [place];Living will [place];Living will  Does patient want to make changes to medical advance directive? - - - - No - Patient declined No - Patient declined No - Patient declined  Copy of [place] in Chart? No - copy requested - No - copy requested - No - copy requested (No Data) No - copy requested  Would patient like information on creating a medical advance directive? - No - Patient declined - - No -  patient declined information - No - patient declined information  Pre-existing out of facility DNR order (yellow form or pink MOST form) - - - - - - -    Tobacco Social History   Tobacco Use  Smoking Status Former Smoker  . Packs/day: 0.50  . Years: 42.00  . Pack years: 21.00  . Types: Cigarettes  . Last attempt to quit: 03/30/2007  . Years since quitting: 10.7  Smokeless Tobacco Never Used  Tobacco Comment   smoked ages [age], up to 1 pp WEEK     Counseling given: Not Answered Comment: smoked ages [age], up to 1 pp WEEK    Past Medical History:  Diagnosis Date  . Aortic stenosis   . Arthritis    "right knee" (02/29/2012)  . Bicuspid aortic valve 09/2008   STATUS POST BIOPROSTHETIC AORTIC VALVE REPLACEMENT  . Complete heart block ([place])   . Hyperlipemia   . Migraines    "occasionally" (02/29/2012)  . Osteopenia   . Pacemaker   . S/P AVR (aortic valve replacement)    July 2010  . Seasonal allergies    Past Surgical History:  Procedure Laterality Date  . CARDIAC VALVE REPLACEMENT  2010   tissue aortic valve; [facility]  . COLONOSCOPY  2005   negative; [place], [place]  . INSERT / REPLACE / REMOVE PACEMAKER    . KNEE ARTHROSCOPY Right   . PERMANENT PACEMAKER INSERTION  03/02/2012   Medtronic  Adapta L implanted by Dr Rayann Heman  . PERMANENT PACEMAKER INSERTION N/A 03/02/2012   Procedure: PERMANENT PACEMAKER INSERTION;  Surgeon: Thompson Grayer, MD;  Location: [facility] CATH LAB;  Service: Cardiovascular;  Laterality: N/A;  . TEMPORARY PACEMAKER INSERTION N/A 03/01/2012   Procedure: TEMPORARY PACEMAKER INSERTION;  Surgeon: Burnell Blanks, MD;  [facility]: [facility] CATH LAB;  Service: Cardiovascular;  Laterality: N/A;  . [place]  . TOTAL KNEE ARTHROPLASTY Right 01/07/2014   Procedure: RIGHT TOTAL KNEE ARTHROPLASTY;  Surgeon: Mauri Pole, MD;  Location: [facility] ORS;  Service: Orthopedics;  Laterality: Right;   Family History  Problem Relation Age of Onset  . Heart  attack Mother 50       CABG  . Heart attack Father 2       aneurysm post MI  . Colon polyps Father   . Heart disease Unknown        in both M & P uncles & aunts; no premature MI  . Diabetes Neg Hx   . Stroke Neg Hx   . Colon cancer Neg Hx   . Pancreatic cancer Neg Hx   . Rectal cancer Neg Hx   . Stomach cancer Neg Hx    Social History   Socioeconomic History  . Marital status: Married    Spouse name: Not on file  . Number of children: Not on file  . Years of education: Not on file  . Highest education level: Not on file  Occupational History  . Occupation: Chief Technology Officer  Social Needs  . Financial resource strain: Not on file  . Food insecurity:    Worry: Not on file    Inability: Not on file  . Transportation needs:    Medical: Not on file    Non-medical: Not on file  Tobacco Use  . Smoking status: Former Smoker    Packs/day: 0.50    Years: 42.00    Pack years: 21.00    Types: Cigarettes    Last attempt to quit: 03/30/2007    Years since quitting: 10.7  . Smokeless tobacco: Never Used  . Tobacco comment: smoked ages [age], up to 1 pp WEEK  Substance and Sexual Activity  . Alcohol use: No  . Drug use: No  . Sexual activity: Yes  Lifestyle  . Physical activity:    Days per week: Not on file    Minutes per session: Not on file  . Stress: Not on file  Relationships  . Social connections:    Talks on phone: Not on file    Gets together: Not on file    Attends religious service: Not on file    Active member of club or organization: Not on file    Attends meetings of clubs or organizations: Not on file    Relationship status: Not on file  Other Topics Concern  . Not on file  Social History Narrative  . Not on file    Outpatient Encounter Medications as of 12/14/2017  Medication Sig  . aspirin EC 81 MG tablet Take 81 mg by mouth daily.  Marland Kitchen atorvastatin (LIPITOR) 40 MG tablet TAKE 1 TABLET BY MOUTH DAILY  . Cholecalciferol (D3-1000) 1000 UNITS  capsule Take 2,000 Units by mouth daily.   . clindamycin (CLEOCIN) 300 MG capsule Take 600 mg by mouth as directed. For dental only  . estradiol (ESTRACE) 2 MG tablet Take 2 mg by mouth at bedtime.   . fluticasone (FLONASE) 50 MCG/ACT nasal spray Place  2 sprays into both nostrils daily.  Marland Kitchen HYDROcodone-homatropine (HYCODAN) 5-1.5 MG/5ML syrup Take 5 mLs by mouth every 6 (six) hours as needed for cough.  . levocetirizine (XYZAL) 5 MG tablet Take 1 tablet (5 mg total) by mouth every evening.  . norethindrone (AYGESTIN) 5 MG tablet Take 2.5 mg by mouth. Once Every 4 months for 10 days  . omega-3 acid ethyl esters (LOVAZA) 1 G capsule Take 2 g by mouth at bedtime.  . Probiotic Product (SOLUBLE FIBER/PROBIOTICS PO) Take 1 capsule by mouth at bedtime.   . tretinoin microspheres (RETIN-A MICRO) 0.1 % gel Apply 1 application topically at bedtime.   . [DISCONTINUED] doxycycline (VIBRA-TABS) 100 MG tablet Take 1 tablet (100 mg total) by mouth 2 (two) times daily.   No facility-administered encounter medications on file as of 12/14/2017.     Activities of Daily Living In your present state of health, do you have any difficulty performing the following activities: 12/14/2017 05/18/2017  Hearing? N N  Vision? N N  Difficulty concentrating or making decisions? N N  Walking or climbing stairs? N N  Dressing or bathing? N N  Doing errands, shopping? N N  Preparing Food and eating ? N -  Using the Toilet? N -  In the past six months, have you accidently leaked urine? N -  Do you have problems with loss of bowel control? N -  Managing your Medications? N -  Managing your Finances? N -  Housekeeping or managing your Housekeeping? N -  Some recent data might be hidden    Patient Care Team: Midge Minium, MD as PCP - General (Family Medicine) Thompson Grayer, MD as Consulting Physician (Cardiology) Inda Castle, MD (Inactive) as Consulting Physician (Gastroenterology) Pleasant, Robley Fries.,  MD as Referring Physician (Obstetrics and Gynecology) Mackie Pai, MD as Referring Physician (Cardiology) [place]ique, Amy, MD as Consulting Physician (Dermatology) Paralee Cancel, MD as Consulting Physician (Orthopedic Surgery)    Assessment:   This is a routine wellness examination for [place].  Exercise Activities and Dietary recommendations Current Exercise Habits: Structured exercise class, Type of exercise: yoga, Time (Minutes): 60, Frequency (Times/Week): 2, Weekly Exercise (Minutes/Week): 120, Exercise limited by: None identified   Diet (meal preparation, eat out, water intake, caffeinated beverages, dairy products, fruits and vegetables): Drinks diet Coke, bai juice, protein shake and water.   Breakfast: yogurt; boiled egg Lunch: fruit; pb, crackers; sandwich Dinner: variety; fruits/salads; Occasional fried food.   Goals    . patient      Maintain current health by staying active.        Fall Risk Fall Risk  12/14/2017 05/18/2017 11/17/2016 07/03/2015 07/01/2014  Falls in the past year? No No No No No    Depression Screen PHQ 2/9 Scores 12/14/2017 05/18/2017 11/17/2016 07/03/2015  PHQ - 2 Score 0 0 0 0  PHQ- 9 Score - 0 - -     Cognitive Function MMSE - Mini Mental State Exam 12/14/2017  Orientation to time 5  Orientation to Place 5  Registration 3  Attention/ Calculation 5  Recall 3  Language- name 2 objects 2  Language- repeat 1  Language- follow 3 step command 3  Language- read & follow direction 1  Write a sentence 1  Copy design 1  Total score 30        Immunization History  Administered Date(s) Administered  . Influenza Split 03/02/2011  . Influenza Whole 02/24/2010  . Influenza,inj,Quad PF,6+ Mos 02/26/2014, 12/31/2014, 01/06/2016, 11/17/2016  .  Influenza-Unspecified 01/12/2012, 12/27/2012  . Pneumococcal Conjugate-13 07/01/2014  . Pneumococcal Polysaccharide-23 03/03/2012  . Tdap 02/29/2012  . Zoster Recombinat (Shingrix) 06/22/2017     Screening Tests Health Maintenance  Topic Date Due  . PAP SMEAR  08/27/2016  . INFLUENZA VACCINE  10/27/2017  . COLONOSCOPY  05/18/2018 (Originally 08/17/2016)  . MAMMOGRAM  10/11/2018  . TETANUS/TDAP  02/28/2022  . DEXA SCAN  Completed  . Hepatitis C Screening  Completed  . PNA vac Low Risk Adult  Completed        Plan:    Schedule colonoscopy.   Bring a copy of your living will and/or healthcare power of attorney to your next office visit.  Continue doing brain stimulating activities (puzzles, reading, adult coloring books, staying active) to keep memory sharp.   I have personally reviewed and noted the following in the patient's chart:   . Medical and social history . Use of alcohol, tobacco or illicit drugs  . Current medications and supplements . Functional ability and status . Nutritional status . Physical activity . Advanced directives . List of other physicians . Hospitalizations, surgeries, and ER visits in previous 12 months . Vitals . Screenings to include cognitive, depression, and falls . Referrals and appointments  In addition, I have reviewed and discussed with patient certain preventive protocols, quality metrics, and best practice recommendations. A written personalized care plan for preventive services as well as general preventive health recommendations were provided to patient.     Gerilyn Nestle, RN  12/14/2017  Reviewed documentation provided by RN and agree w/ above.  Annye Asa, MD

## 2017-12-14 ENCOUNTER — Ambulatory Visit (INDEPENDENT_AMBULATORY_CARE_PROVIDER_SITE_OTHER): Payer: Medicare Other

## 2017-12-14 ENCOUNTER — Ambulatory Visit (INDEPENDENT_AMBULATORY_CARE_PROVIDER_SITE_OTHER): Payer: Medicare Other | Admitting: Family Medicine

## 2017-12-14 ENCOUNTER — Encounter: Payer: Self-pay | Admitting: Family Medicine

## 2017-12-14 ENCOUNTER — Other Ambulatory Visit: Payer: Self-pay

## 2017-12-14 VITALS — BP 128/58 | HR 67 | Temp 97.9°F | Resp 16 | Ht 64.0 in | Wt 137.5 lb

## 2017-12-14 DIAGNOSIS — Z23 Encounter for immunization: Secondary | ICD-10-CM | POA: Diagnosis not present

## 2017-12-14 DIAGNOSIS — E785 Hyperlipidemia, unspecified: Secondary | ICD-10-CM | POA: Diagnosis not present

## 2017-12-14 DIAGNOSIS — Z1211 Encounter for screening for malignant neoplasm of colon: Secondary | ICD-10-CM | POA: Diagnosis not present

## 2017-12-14 DIAGNOSIS — Z0000 Encounter for general adult medical examination without abnormal findings: Secondary | ICD-10-CM

## 2017-12-14 LAB — CBC WITH DIFFERENTIAL/PLATELET
BASOS PCT: 0.5 % (ref 0.0–3.0)
Basophils Absolute: 0 10*3/uL (ref 0.0–0.1)
EOS ABS: 0.2 10*3/uL (ref 0.0–0.7)
Eosinophils Relative: 2.4 % (ref 0.0–5.0)
HCT: 36.8 % (ref 36.0–46.0)
Hemoglobin: 12.6 g/dL (ref 12.0–15.0)
LYMPHS ABS: 2.4 10*3/uL (ref 0.7–4.0)
Lymphocytes Relative: 35.7 % (ref 12.0–46.0)
MCHC: 34.2 g/dL (ref 30.0–36.0)
MCV: 91 fl (ref 78.0–100.0)
MONO ABS: 0.4 10*3/uL (ref 0.1–1.0)
Monocytes Relative: 6.5 % (ref 3.0–12.0)
Neutro Abs: 3.7 10*3/uL (ref 1.4–7.7)
Neutrophils Relative %: 54.9 % (ref 43.0–77.0)
Platelets: 182 10*3/uL (ref 150.0–400.0)
RBC: 4.04 Mil/uL (ref 3.87–5.11)
RDW: 13.2 % (ref 11.5–15.5)
WBC: 6.7 10*3/uL (ref 4.0–10.5)

## 2017-12-14 LAB — HEPATIC FUNCTION PANEL
ALT: 9 U/L (ref 0–35)
AST: 19 U/L (ref 0–37)
Albumin: 3.7 g/dL (ref 3.5–5.2)
Alkaline Phosphatase: 52 U/L (ref 39–117)
BILIRUBIN DIRECT: 0.1 mg/dL (ref 0.0–0.3)
BILIRUBIN TOTAL: 0.6 mg/dL (ref 0.2–1.2)
Total Protein: 6.5 g/dL (ref 6.0–8.3)

## 2017-12-14 LAB — BASIC METABOLIC PANEL
BUN: 23 mg/dL (ref 6–23)
CALCIUM: 9.2 mg/dL (ref 8.4–10.5)
CO2: 27 meq/L (ref 19–32)
CREATININE: 0.56 mg/dL (ref 0.40–1.20)
Chloride: 105 mEq/L (ref 96–112)
GFR: 112.83 mL/min (ref >60.00)
Glucose, Bld: 84 mg/dL (ref 70–99)
Potassium: 4.3 mEq/L (ref 3.5–5.1)
Sodium: 136 mEq/L (ref 135–145)

## 2017-12-14 LAB — LIPID PANEL
Cholesterol: 140 mg/dL (ref 0–200)
HDL: 47.5 mg/dL (ref >39.00)
LDL Cholesterol: 81 mg/dL (ref 0–99)
NONHDL: 92.93
Total CHOL/HDL Ratio: 3
Triglycerides: 62 mg/dL (ref 0.0–149.0)
VLDL: 12.4 mg/dL (ref 0.0–40.0)

## 2017-12-14 LAB — TSH: TSH: 1.98 u[IU]/mL (ref 0.35–4.50)

## 2017-12-14 NOTE — Progress Notes (Signed)
   Subjective:    Patient ID: Angel Waller, female    DOB: [birth year], 72 y.o.   MRN: [id number]  HPI CPE- UTD on mammo.  Due for colonoscopy- ordered.  UTD on immunizations. Declines DEXA.    Review of Systems Patient reports no vision/ hearing changes, adenopathy,fever, weight change,  persistant/recurrent hoarseness , swallowing issues, chest pain, palpitations, edema, persistant/recurrent cough, hemoptysis, dyspnea (rest/exertional/paroxysmal nocturnal), gastrointestinal bleeding (melena, rectal bleeding), abdominal pain, significant heartburn, bowel changes, GU symptoms (dysuria, hematuria, incontinence), Gyn symptoms (abnormal  bleeding, pain),  syncope, focal weakness, memory loss, numbness & tingling, skin/hair/nail changes, abnormal bruising or bleeding, anxiety, or depression.     Objective:   Physical Exam General Appearance:    Alert, cooperative, no distress, appears stated age  Head:    Normocephalic, without obvious abnormality, atraumatic  Eyes:    PERRL, conjunctiva/corneas clear, EOM's intact, fundi    benign, both eyes  Ears:    Normal TM's and external ear canals, both ears  Nose:   Nares normal, septum midline, mucosa normal, no drainage    or sinus tenderness  Throat:   Lips, mucosa, and tongue normal; teeth and gums normal  Neck:   Supple, symmetrical, trachea midline, no adenopathy;    Thyroid: no enlargement/tenderness/nodules  Back:     Symmetric, no curvature, ROM normal, no CVA tenderness  Lungs:     Clear to auscultation bilaterally, respirations unlabored  Chest Wall:    No tenderness or deformity   Heart:    III/VI SEM  Breast Exam:    Deferred to mammo  Abdomen:     Soft, non-tender, bowel sounds active all four quadrants,    no masses, no organomegaly  Genitalia:    Deferred  Rectal:    Extremities:   Extremities normal, atraumatic, no cyanosis or edema  Pulses:   2+ and symmetric all extremities  Skin:   Skin color, texture, turgor normal, no  rashes or lesions  Lymph nodes:   Cervical, supraclavicular, and axillary nodes normal  Neurologic:   CNII-XII intact, normal strength, sensation and reflexes    throughout          Assessment & Plan:

## 2017-12-14 NOTE — Patient Instructions (Addendum)
Schedule colonoscopy.   Bring a copy of your living will and/or healthcare power of attorney to your next office visit.  Continue doing brain stimulating activities (puzzles, reading, adult coloring books, staying active) to keep memory sharp.   Health Maintenance, Female Adopting a healthy lifestyle and getting preventive care can go a long way to promote health and wellness. Talk with your health care provider about what schedule of regular examinations is right for you. This is a good chance for you to check in with your provider about disease prevention and staying healthy. In between checkups, there are plenty of things you can do on your own. Experts have done a lot of research about which lifestyle changes and preventive measures are most likely to keep you healthy. Ask your health care provider for more information. Weight and diet Eat a healthy diet  Be sure to include plenty of vegetables, fruits, low-fat dairy products, and lean protein.  Do not eat a lot of foods high in solid fats, added sugars, or salt.  Get regular exercise. This is one of the most important things you can do for your health. ? Most adults should exercise for at least 150 minutes each week. The exercise should increase your heart rate and make you sweat (moderate-intensity exercise). ? Most adults should also do strengthening exercises at least twice a week. This is in addition to the moderate-intensity exercise.  Maintain a healthy weight  Body mass index (BMI) is a measurement that can be used to identify possible weight problems. It estimates body fat based on height and weight. Your health care provider can help determine your BMI and help you achieve or maintain a healthy weight.  For females 16 years of age and older: ? A BMI below 18.5 is considered underweight. ? A BMI of 18.5 to 24.9 is normal. ? A BMI of 25 to 29.9 is considered overweight. ? A BMI of 30 and above is considered obese.  Watch  levels of cholesterol and blood lipids  You should start having your blood tested for lipids and cholesterol at 73 years of age, then have this test every 5 years.  You may need to have your cholesterol levels checked more often if: ? Your lipid or cholesterol levels are high. ? You are older than 73 years of age. ? You are at high risk for heart disease.  Cancer screening Lung Cancer  Lung cancer screening is recommended for adults 9-69 years old who are at high risk for lung cancer because of a history of smoking.  A yearly low-dose CT scan of the lungs is recommended for people who: ? Currently smoke. ? Have quit within the past 15 years. ? Have at least a 30-pack-year history of smoking. A pack year is smoking an average of one pack of cigarettes a day for 1 year.  Yearly screening should continue until it has been 15 years since you quit.  Yearly screening should stop if you develop a health problem that would prevent you from having lung cancer treatment.  Breast Cancer  Practice breast self-awareness. This means understanding how your breasts normally appear and feel.  It also means doing regular breast self-exams. Let your health care provider know about any changes, no matter how small.  If you are in your 20s or 30s, you should have a clinical breast exam (CBE) by a health care provider every 1-3 years as part of a regular health exam.  If you are 40 or older,  have a CBE every year. Also consider having a breast X-ray (mammogram) every year.  If you have a family history of breast cancer, talk to your health care provider about genetic screening.  If you are at high risk for breast cancer, talk to your health care provider about having an MRI and a mammogram every year.  Breast cancer gene (BRCA) assessment is recommended for women who have family members with BRCA-related cancers. BRCA-related cancers include: ? Breast. ? Ovarian. ? Tubal. ? Peritoneal  cancers.  Results of the assessment will determine the need for genetic counseling and BRCA1 and BRCA2 testing.  Cervical Cancer Your health care provider may recommend that you be screened regularly for cancer of the pelvic organs (ovaries, uterus, and vagina). This screening involves a pelvic examination, including checking for microscopic changes to the surface of your cervix (Pap test). You may be encouraged to have this screening done every 3 years, beginning at age 21.  For women ages 30-65, health care providers may recommend pelvic exams and Pap testing every 3 years, or they may recommend the Pap and pelvic exam, combined with testing for human papilloma virus (HPV), every 5 years. Some types of HPV increase your risk of cervical cancer. Testing for HPV may also be done on women of any age with unclear Pap test results.  Other health care providers may not recommend any screening for nonpregnant women who are considered low risk for pelvic cancer and who do not have symptoms. Ask your health care provider if a screening pelvic exam is right for you.  If you have had past treatment for cervical cancer or a condition that could lead to cancer, you need Pap tests and screening for cancer for at least 20 years after your treatment. If Pap tests have been discontinued, your risk factors (such as having a new sexual partner) need to be reassessed to determine if screening should resume. Some women have medical problems that increase the chance of getting cervical cancer. In these cases, your health care provider may recommend more frequent screening and Pap tests.  Colorectal Cancer  This type of cancer can be detected and often prevented.  Routine colorectal cancer screening usually begins at 73 years of age and continues through 73 years of age.  Your health care provider may recommend screening at an earlier age if you have risk factors for colon cancer.  Your health care provider may also  recommend using home test kits to check for hidden blood in the stool.  A small camera at the end of a tube can be used to examine your colon directly (sigmoidoscopy or colonoscopy). This is done to check for the earliest forms of colorectal cancer.  Routine screening usually begins at age 50.  Direct examination of the colon should be repeated every 5-10 years through 73 years of age. However, you may need to be screened more often if early forms of precancerous polyps or small growths are found.  Skin Cancer  Check your skin from head to toe regularly.  Tell your health care provider about any new moles or changes in moles, especially if there is a change in a mole's shape or color.  Also tell your health care provider if you have a mole that is larger than the size of a pencil eraser.  Always use sunscreen. Apply sunscreen liberally and repeatedly throughout the day.  Protect yourself by wearing long sleeves, pants, a wide-brimmed hat, and sunglasses whenever you are outside.    Heart disease, diabetes, and high blood pressure  High blood pressure causes heart disease and increases the risk of stroke. High blood pressure is more likely to develop in: ? People who have blood pressure in the high end of the normal range (130-139/85-89 mm Hg). ? People who are overweight or obese. ? People who are African American.  If you are 70-78 years of age, have your blood pressure checked every 3-5 years. If you are 24 years of age or older, have your blood pressure checked every year. You should have your blood pressure measured twice-once when you are at a hospital or clinic, and once when you are not at a hospital or clinic. Record the average of the two measurements. To check your blood pressure when you are not at a hospital or clinic, you can use: ? An automated blood pressure machine at a pharmacy. ? A home blood pressure monitor.  If you are between 77 years and 5 years old, ask your  health care provider if you should take aspirin to prevent strokes.  Have regular diabetes screenings. This involves taking a blood sample to check your fasting blood sugar level. ? If you are at a normal weight and have a low risk for diabetes, have this test once every three years after 73 years of age. ? If you are overweight and have a high risk for diabetes, consider being tested at a younger age or more often. Preventing infection Hepatitis B  If you have a higher risk for hepatitis B, you should be screened for this virus. You are considered at high risk for hepatitis B if: ? You were born in a country where hepatitis B is common. Ask your health care provider which countries are considered high risk. ? Your parents were born in a high-risk country, and you have not been immunized against hepatitis B (hepatitis B vaccine). ? You have HIV or AIDS. ? You use needles to inject street drugs. ? You live with someone who has hepatitis B. ? You have had sex with someone who has hepatitis B. ? You get hemodialysis treatment. ? You take certain medicines for conditions, including cancer, organ transplantation, and autoimmune conditions.  Hepatitis C  Blood testing is recommended for: ? Everyone born from [birth year] through 1966. ? Anyone with known risk factors for hepatitis C.  Sexually transmitted infections (STIs)  You should be screened for sexually transmitted infections (STIs) including gonorrhea and chlamydia if: ? You are sexually active and are younger than 73 years of age. ? You are older than 73 years of age and your health care provider tells you that you are at risk for this type of infection. ? Your sexual activity has changed since you were last screened and you are at an increased risk for chlamydia or gonorrhea. Ask your health care provider if you are at risk.  If you do not have HIV, but are at risk, it may be recommended that you take a prescription medicine daily to  prevent HIV infection. This is called pre-exposure prophylaxis (PrEP). You are considered at risk if: ? You are sexually active and do not regularly use condoms or know the HIV status of your partner(s). ? You take drugs by injection. ? You are sexually active with a partner who has HIV.  Talk with your health care provider about whether you are at high risk of being infected with HIV. If you choose to begin PrEP, you should first be tested for HIV.  You should then be tested every 3 months for as long as you are taking PrEP. Pregnancy  If you are premenopausal and you may become pregnant, ask your health care provider about preconception counseling.  If you may become pregnant, take 400 to 800 micrograms (mcg) of folic acid every day.  If you want to prevent pregnancy, talk to your health care provider about birth control (contraception). Osteoporosis and menopause  Osteoporosis is a disease in which the bones lose minerals and strength with aging. This can result in serious bone fractures. Your risk for osteoporosis can be identified using a bone density scan.  If you are 12 years of age or older, or if you are at risk for osteoporosis and fractures, ask your health care provider if you should be screened.  Ask your health care provider whether you should take a calcium or vitamin D supplement to lower your risk for osteoporosis.  Menopause may have certain physical symptoms and risks.  Hormone replacement therapy may reduce some of these symptoms and risks. Talk to your health care provider about whether hormone replacement therapy is right for you. Follow these instructions at home:  Schedule regular health, dental, and eye exams.  Stay current with your immunizations.  Do not use any tobacco products including cigarettes, chewing tobacco, or electronic cigarettes.  If you are pregnant, do not drink alcohol.  If you are breastfeeding, limit how much and how often you drink  alcohol.  Limit alcohol intake to no more than 1 drink per day for nonpregnant women. One drink equals 12 ounces of beer, 5 ounces of wine, or 1 ounces of hard liquor.  Do not use street drugs.  Do not share needles.  Ask your health care provider for help if you need support or information about quitting drugs.  Tell your health care provider if you often feel depressed.  Tell your health care provider if you have ever been abused or do not feel safe at home. This information is not intended to replace advice given to you by your health care provider. Make sure you discuss any questions you have with your health care provider. Document Released: 09/28/2010 Document Revised: 08/21/2015 Document Reviewed: 12/17/2014 Elsevier Interactive Patient Education  [employer].

## 2017-12-14 NOTE — Assessment & Plan Note (Signed)
Pt's PE unchanged from previous and WNL w/ exception of heart murmur.  UTD on mammo.  Due for colonoscopy- pt to schedule.  UTD on immunizations.  Check labs.  Anticipatory guidance provided.

## 2017-12-14 NOTE — Patient Instructions (Signed)
Follow up in 6 months to recheck cholesterol We'll notify you of your lab results and make any changes if needed Continue to work on healthy diet and regular exercise- you look great! Call with any questions or concerns Happy Fall!!!

## 2017-12-14 NOTE — Assessment & Plan Note (Signed)
Chronic problem.  Tolerating statin w/o difficulty.  Check labs.  Adjust meds prn  

## 2017-12-15 ENCOUNTER — Encounter: Payer: Self-pay | Admitting: General Practice

## 2017-12-20 ENCOUNTER — Encounter: Payer: Self-pay | Admitting: General Practice

## 2018-01-25 ENCOUNTER — Ambulatory Visit (INDEPENDENT_AMBULATORY_CARE_PROVIDER_SITE_OTHER): Payer: Medicare Other | Admitting: *Deleted

## 2018-01-25 DIAGNOSIS — I442 Atrioventricular block, complete: Secondary | ICD-10-CM | POA: Diagnosis not present

## 2018-01-27 NOTE — Progress Notes (Signed)
Remote pacemaker transmission.   

## 2018-02-03 ENCOUNTER — Encounter: Payer: Self-pay | Admitting: Cardiology

## 2018-03-26 LAB — CUP PACEART REMOTE DEVICE CHECK
Battery Impedance: 522 Ohm
Brady Statistic AP VP Percent: 27 %
Brady Statistic AP VS Percent: 0 %
Brady Statistic AS VP Percent: 73 %
Brady Statistic AS VS Percent: 1 %
Implantable Lead Implant Date: 20131205
Implantable Lead Location: 753859
Implantable Lead Location: 753860
Implantable Lead Model: 5092
Lead Channel Impedance Value: 394 Ohm
Lead Channel Impedance Value: 547 Ohm
Lead Channel Pacing Threshold Amplitude: 0.5 V
Lead Channel Pacing Threshold Amplitude: 0.625 V
Lead Channel Pacing Threshold Pulse Width: 0.4 ms
Lead Channel Setting Pacing Amplitude: 2.5 V
Lead Channel Setting Sensing Sensitivity: 2.8 mV
MDC IDC LEAD IMPLANT DT: 20131205
MDC IDC MSMT BATTERY REMAINING LONGEVITY: 80 mo
MDC IDC MSMT BATTERY VOLTAGE: 2.79 V
MDC IDC MSMT LEADCHNL RV PACING THRESHOLD PULSEWIDTH: 0.4 ms
MDC IDC PG IMPLANT DT: 20131205
MDC IDC SESS DTM: 20191030133909
MDC IDC SET LEADCHNL RA PACING AMPLITUDE: 2 V
MDC IDC SET LEADCHNL RV PACING PULSEWIDTH: 0.4 ms

## 2018-04-10 ENCOUNTER — Encounter: Payer: Self-pay | Admitting: Gastroenterology

## 2018-04-19 ENCOUNTER — Ambulatory Visit (AMBULATORY_SURGERY_CENTER): Payer: Self-pay | Admitting: *Deleted

## 2018-04-19 ENCOUNTER — Encounter: Payer: Self-pay | Admitting: Gastroenterology

## 2018-04-19 VITALS — Ht 64.0 in | Wt 135.0 lb

## 2018-04-19 DIAGNOSIS — Z86010 Personal history of colonic polyps: Secondary | ICD-10-CM

## 2018-04-19 MED ORDER — NA SULFATE-K SULFATE-MG SULF 17.5-3.13-1.6 GM/177ML PO SOLN: 1.0000 | Freq: Once | ORAL | 0 refills | Status: AC

## 2018-04-19 NOTE — Progress Notes (Signed)
No egg or soy allergy known to patient  No issues with past sedation with any surgeries  or procedures, no intubation problems  No diet pills per patient No home 02 use per patient  No blood thinners per patient  Pt denies issues with constipation  No A fib or A flutter  EMMI video sent to pt's e mail pt declined   

## 2018-04-26 ENCOUNTER — Ambulatory Visit (INDEPENDENT_AMBULATORY_CARE_PROVIDER_SITE_OTHER): Payer: Medicare Other

## 2018-04-26 DIAGNOSIS — I442 Atrioventricular block, complete: Secondary | ICD-10-CM | POA: Diagnosis not present

## 2018-04-27 NOTE — Progress Notes (Signed)
Remote pacemaker transmission.   

## 2018-04-28 LAB — CUP PACEART REMOTE DEVICE CHECK
Battery Impedance: 572 Ohm
Battery Voltage: 2.79 V
Brady Statistic AS VS Percent: 0 %
Date Time Interrogation Session: 20200129135519
Implantable Lead Implant Date: 20131205
Implantable Lead Location: 753859
Implantable Lead Model: 5076
Implantable Lead Model: 5092
Implantable Pulse Generator Implant Date: 20131205
Lead Channel Impedance Value: 578 Ohm
Lead Channel Pacing Threshold Pulse Width: 0.4 ms
Lead Channel Setting Pacing Amplitude: 2 V
Lead Channel Setting Pacing Amplitude: 2.5 V
Lead Channel Setting Pacing Pulse Width: 0.4 ms
Lead Channel Setting Sensing Sensitivity: 2.8 mV
MDC IDC LEAD IMPLANT DT: 20131205
MDC IDC LEAD LOCATION: 753860
MDC IDC MSMT BATTERY REMAINING LONGEVITY: 78 mo
MDC IDC MSMT LEADCHNL RA IMPEDANCE VALUE: 415 Ohm
MDC IDC MSMT LEADCHNL RA PACING THRESHOLD AMPLITUDE: 0.5 V
MDC IDC MSMT LEADCHNL RA PACING THRESHOLD PULSEWIDTH: 0.4 ms
MDC IDC MSMT LEADCHNL RV PACING THRESHOLD AMPLITUDE: 0.625 V
MDC IDC STAT BRADY AP VP PERCENT: 27 %
MDC IDC STAT BRADY AP VS PERCENT: 0 %
MDC IDC STAT BRADY AS VP PERCENT: 72 %

## 2018-05-03 ENCOUNTER — Encounter: Payer: Self-pay | Admitting: Gastroenterology

## 2018-05-03 ENCOUNTER — Ambulatory Visit (AMBULATORY_SURGERY_CENTER): Payer: Medicare Other | Admitting: Gastroenterology

## 2018-05-03 VITALS — BP 87/42 | HR 60 | Temp 97.7°F | Resp 11 | Ht 64.0 in | Wt 137.0 lb

## 2018-05-03 DIAGNOSIS — Z86010 Personal history of colonic polyps: Secondary | ICD-10-CM

## 2018-05-03 DIAGNOSIS — D123 Benign neoplasm of transverse colon: Secondary | ICD-10-CM

## 2018-05-03 MED ORDER — SODIUM CHLORIDE 0.9 % IV SOLN: 500.0000 mL | Freq: Once | INTRAVENOUS | Status: DC

## 2018-05-03 NOTE — Progress Notes (Signed)
A/ox3, pleased with MAC, report to RN 

## 2018-05-03 NOTE — Progress Notes (Signed)
Called to room to assist during endoscopic procedure.  Patient ID and intended procedure confirmed with present staff. Received instructions for my participation in the procedure from the performing physician.  

## 2018-05-03 NOTE — Op Note (Signed)
[place] Patient Name: Angel Waller Procedure Date: 05/03/2018 7:56 AM MRN: [id number] Endoscopist: Remo Lipps P. Havery Moros , MD Age: 73 Referring MD:  Date of Birth: 1945-12-24 Gender: Female Account #: [id number] Procedure:                Colonoscopy Indications:              High risk colon cancer surveillance: Personal                            history of colonic polyps Medicines:                Monitored Anesthesia Care Procedure:                Pre-Anesthesia Assessment:                           - Prior to the procedure, a History and Physical                            was performed, and patient medications and                            allergies were reviewed. The patient's tolerance of                            previous anesthesia was also reviewed. The risks                            and benefits of the procedure and the sedation                            options and risks were discussed with the patient.                            All questions were answered, and informed consent                            was obtained. Prior Anticoagulants: The patient has                            taken no previous anticoagulant or antiplatelet                            agents. ASA Grade Assessment: II - A patient with                            mild systemic disease. After reviewing the risks                            and benefits, the patient was deemed in                            satisfactory condition to undergo the procedure.  After obtaining informed consent, the colonoscope                            was passed under direct vision. Throughout the                            procedure, the patient's blood pressure, pulse, and                            oxygen saturations were monitored continuously. The                            Colonoscope was introduced through the anus and                            advanced to the the cecum,  identified by                            appendiceal orifice and ileocecal valve. The                            colonoscopy was performed without difficulty. The                            patient tolerated the procedure well. The quality                            of the bowel preparation was adequate. The                            ileocecal valve, appendiceal orifice, and rectum                            were photographed. Scope In: 8:04:02 AM Scope Out: 8:28:26 AM Scope Withdrawal Time: 0 hours 18 minutes 56 seconds  Total Procedure Duration: 0 hours 24 minutes 24 seconds  Findings:                 The perianal and digital rectal examinations were                            normal.                           A 3 mm polyp was found in the hepatic flexure. The                            polyp was sessile. The polyp was removed with a                            cold snare. Resection and retrieval were complete.                           The colon was tortuous.  Internal hemorrhoids were found during retroflexion.                           The exam was otherwise without abnormality. Complications:            No immediate complications. Estimated blood loss:                            Minimal. Estimated Blood Loss:     Estimated blood loss was minimal. Impression:               - One 3 mm polyp at the hepatic flexure, removed                            with a cold snare. Resected and retrieved.                           - Tortuous colon.                           - Internal hemorrhoids.                           - The examination was otherwise normal. Recommendation:           - Patient has a contact number available for                            emergencies. The signs and symptoms of potential                            delayed complications were discussed with the                            patient. Return to normal activities tomorrow.                             Written discharge instructions were provided to the                            patient.                           - Resume previous diet.                           - Continue present medications.                           - Await pathology results. Remo Lipps P. Thelia Tanksley, MD 05/03/2018 8:33:08 AM This report has been signed electronically.

## 2018-05-03 NOTE — Progress Notes (Signed)
Pt's states no medical or surgical changes since previsit or office visit. 

## 2018-05-03 NOTE — Patient Instructions (Signed)
Handouts given on polyps and hemorrhoids. You had one polyp removed today and internal hemorrhoids. The results from the polyp pathology will come in the form of a letter from Dr Havery Moros in 1-3 weeks. Please call us if its been 3 weeks and you still havent received your letter.  Some recommendations for your first meal are: EGGS GRITS TOAST PANCAKES OR WAFFLES LEAN MEAT    YOU HAD AN ENDOSCOPIC PROCEDURE TODAY AT THE [facility]:   Refer to the procedure report that was given to you for any specific questions about what was found during the examination.  If the procedure report does not answer your questions, please call your gastroenterologist to clarify.  If you requested that your care partner not be given the details of your procedure findings, then the procedure report has been included in a sealed envelope for you to review at your convenience later.  YOU SHOULD EXPECT: Some feelings of bloating in the abdomen. Passage of more gas than usual.  Walking can help get rid of the air that was put into your GI tract during the procedure and reduce the bloating. If you had a lower endoscopy (such as a colonoscopy or flexible sigmoidoscopy) you may notice spotting of blood in your stool or on the toilet paper. If you underwent a bowel prep for your procedure, you may not have a normal bowel movement for a few days.  Please Note:  You might notice some irritation and congestion in your nose or some drainage.  This is from the oxygen used during your procedure.  There is no need for concern and it should clear up in a day or so.  SYMPTOMS TO REPORT IMMEDIATELY:   Following lower endoscopy (colonoscopy or flexible sigmoidoscopy):  Excessive amounts of blood in the stool  Significant tenderness or worsening of abdominal pains  Swelling of the abdomen that is new, acute  Fever of 100F or higher   For urgent or emergent issues, a gastroenterologist can be reached at any hour by  calling [phone].   DIET:  We do recommend a small meal at first, but then you may proceed to your regular diet.  Drink plenty of fluids but you should avoid alcoholic beverages for 24 hours.  ACTIVITY:  You should plan to take it easy for the rest of today and you should NOT DRIVE or use heavy machinery until tomorrow (because of the sedation medicines used during the test).    FOLLOW UP: Our staff will call the number listed on your records the next business day following your procedure to check on you and address any questions or concerns that you may have regarding the information given to you following your procedure. If we do not reach you, we will leave a message.  However, if you are feeling well and you are not experiencing any problems, there is no need to return our call.  We will assume that you have returned to your regular daily activities without incident.  If any biopsies were taken you will be contacted by phone or by letter within the next 1-3 weeks.  Please call us at [phone] if you have not heard about the biopsies in 3 weeks.    SIGNATURES/CONFIDENTIALITY: You and/or your care partner have signed paperwork which will be entered into your electronic medical record.  These signatures attest to the fact that that the information above on your After Visit Summary has been reviewed and is understood.  Full responsibility  of the confidentiality of this discharge information lies with you and/or your care-partner.

## 2018-05-04 ENCOUNTER — Telehealth: Payer: Self-pay

## 2018-05-04 NOTE — Telephone Encounter (Signed)
  Follow up Call-  Call back number 05/03/2018  Post procedure Call Back phone  # [phone]  Permission to leave phone message Yes  Some recent data might be hidden     Patient questions:  Do you have a fever, pain , or abdominal swelling? No. Pain Score  0 *  Have you tolerated food without any problems? Yes.    Have you been able to return to your normal activities? Yes.    Do you have any questions about your discharge instructions: Diet   No. Medications  No. Follow up visit  No.  Do you have questions or concerns about your Care? No.  Actions: * If pain score is 4 or above: No action needed, pain <4.

## 2018-06-14 ENCOUNTER — Ambulatory Visit: Payer: Medicare Other | Admitting: Family Medicine

## 2018-06-14 ENCOUNTER — Other Ambulatory Visit: Payer: Self-pay

## 2018-06-14 ENCOUNTER — Encounter: Payer: Self-pay | Admitting: Family Medicine

## 2018-06-14 VITALS — BP 120/76 | HR 63 | Temp 98.1°F | Resp 16 | Ht 64.0 in | Wt 131.5 lb

## 2018-06-14 DIAGNOSIS — E785 Hyperlipidemia, unspecified: Secondary | ICD-10-CM

## 2018-06-14 LAB — HEPATIC FUNCTION PANEL
ALT: 14 U/L (ref 0–35)
AST: 23 U/L (ref 0–37)
Albumin: 4 g/dL (ref 3.5–5.2)
Alkaline Phosphatase: 50 U/L (ref 39–117)
Bilirubin, Direct: 0.1 mg/dL (ref 0.0–0.3)
Total Bilirubin: 0.6 mg/dL (ref 0.2–1.2)
Total Protein: 6.7 g/dL (ref 6.0–8.3)

## 2018-06-14 LAB — LIPID PANEL
Cholesterol: 171 mg/dL (ref 0–200)
HDL: 57.2 mg/dL (ref >39.00)
LDL Cholesterol: 100 mg/dL — ABNORMAL HIGH (ref 0–99)
NonHDL: 114.08
Total CHOL/HDL Ratio: 3
Triglycerides: 69 mg/dL (ref 0.0–149.0)
VLDL: 13.8 mg/dL (ref 0.0–40.0)

## 2018-06-14 LAB — BASIC METABOLIC PANEL
BUN: 18 mg/dL (ref 6–23)
CO2: 27 mEq/L (ref 19–32)
Calcium: 9.2 mg/dL (ref 8.4–10.5)
Chloride: 105 mEq/L (ref 96–112)
Creatinine, Ser: 0.72 mg/dL (ref 0.40–1.20)
GFR: 79.32 mL/min (ref >60.00)
Glucose, Bld: 93 mg/dL (ref 70–99)
Potassium: 4.2 mEq/L (ref 3.5–5.1)
Sodium: 138 mEq/L (ref 135–145)

## 2018-06-14 NOTE — Patient Instructions (Signed)
Schedule your complete physical in 6 months We'll notify you of your lab results and make any changes if needed Keep up the good work!  You look great! Call with any questions or concerns Stay Safe!!!

## 2018-06-14 NOTE — Progress Notes (Signed)
   Subjective:    Patient ID: Angel Waller, female    DOB: 1946/01/18, 73 y.o.   MRN: [id number]  HPI Hyperlipidemia- chronic problem, on Lipitor 40mg  daily.  Pt is down 5 lbs since last visit.  Pt reports feeling well.  Denies CP, SOB, abd pain, N/V.  Exercising regularly.   Review of Systems For ROS see HPI     Objective:   Physical Exam Vitals signs reviewed.  Constitutional:      General: She is not in acute distress.    Appearance: She is well-developed.  HENT:     Head: Normocephalic and atraumatic.  Eyes:     Conjunctiva/sclera: Conjunctivae normal.     Pupils: Pupils are equal, round, and reactive to light.  Neck:     Musculoskeletal: Normal range of motion and neck supple.     Thyroid: No thyromegaly.  Cardiovascular:     Rate and Rhythm: Normal rate and regular rhythm.     Heart sounds: Murmur (III/VI SEM) present.  Pulmonary:     Effort: Pulmonary effort is normal. No respiratory distress.     Breath sounds: Normal breath sounds.  Abdominal:     General: There is no distension.     Palpations: Abdomen is soft.     Tenderness: There is no abdominal tenderness.  Lymphadenopathy:     Cervical: No cervical adenopathy.  Skin:    General: Skin is warm and dry.  Neurological:     Mental Status: She is alert and oriented to person, place, and time.  Psychiatric:        Behavior: Behavior normal.           Assessment & Plan:

## 2018-06-14 NOTE — Assessment & Plan Note (Signed)
Chronic problem.  Tolerating statin w/o difficulty.  Check labs.  Adjust meds prn  

## 2018-07-04 ENCOUNTER — Telehealth: Payer: Self-pay

## 2018-07-04 NOTE — Telephone Encounter (Signed)
Spoke with pt regarding appt on 07/05/18. Pt was advise to check vitals day of appt. Pt stated she is unable to upload EKG. Pt questions and concerns were address.

## 2018-07-05 ENCOUNTER — Telehealth (INDEPENDENT_AMBULATORY_CARE_PROVIDER_SITE_OTHER): Payer: Medicare Other | Admitting: Internal Medicine

## 2018-07-05 VITALS — BP 140/65 | HR 74 | Temp 98.4°F | Wt 133.0 lb

## 2018-07-05 DIAGNOSIS — Z950 Presence of cardiac pacemaker: Secondary | ICD-10-CM

## 2018-07-05 DIAGNOSIS — I428 Other cardiomyopathies: Secondary | ICD-10-CM

## 2018-07-05 DIAGNOSIS — I442 Atrioventricular block, complete: Secondary | ICD-10-CM

## 2018-07-05 DIAGNOSIS — I472 Ventricular tachycardia: Secondary | ICD-10-CM | POA: Diagnosis not present

## 2018-07-05 DIAGNOSIS — Z952 Presence of prosthetic heart valve: Secondary | ICD-10-CM | POA: Diagnosis not present

## 2018-07-05 DIAGNOSIS — I4729 Other ventricular tachycardia: Secondary | ICD-10-CM

## 2018-07-05 NOTE — Progress Notes (Signed)
Electrophysiology TeleHealth Note   Due to national recommendations of social distancing due to COVID 19, an audio/video telehealth visit is felt to be most appropriate for this patient at this time.  See MyChart message from today for the patient's consent to telehealth for Angel [facility].   Date:  07/05/2018   ID:  Angel Waller, DOB 1945-08-27, MRN [id number]  Location: patient's home  Provider location: [address], [place]  Evaluation Performed: Follow-up visit  PCP:  Midge Minium, MD  Cardiologist:   Dr Michaelle Birks  Electrophysiologist:  Dr Rayann Heman  Chief Complaint:   CHF  History of Present Illness:    Angel Waller is a 73 y.o. female who presents via audio/video conferencing for a telehealth visit today.  Since last being seen in our clinic, the patient reports doing very well.  Today, she denies symptoms of palpitations, chest pain, shortness of breath,  lower extremity edema, dizziness, presyncope, or syncope.  The patient is otherwise without complaint today.  The patient denies symptoms of fevers, chills, cough, or new SOB worrisome for COVID 19.  Past Medical History:  Diagnosis Date  . Allergy   . Aortic stenosis   . Arthritis    "right knee" (02/29/2012)  . Bicuspid aortic valve 09/2008   STATUS POST BIOPROSTHETIC AORTIC VALVE REPLACEMENT  . Blood transfusion without reported diagnosis   . Complete heart block ([place])   . Heart murmur   . Hyperlipemia   . Migraines    "occasionally" (02/29/2012)  . Osteopenia   . Pacemaker   . S/P AVR (aortic valve replacement)    July 2010  . Seasonal allergies     Past Surgical History:  Procedure Laterality Date  . CARDIAC VALVE REPLACEMENT  2010   tissue aortic valve; [facility]  . COLONOSCOPY  2005   negative; [place], [place]  . INSERT / REPLACE / REMOVE PACEMAKER    . KNEE ARTHROSCOPY Right   . PERMANENT PACEMAKER INSERTION  03/02/2012   Medtronic Adapta L implanted by Dr Rayann Heman  . PERMANENT  PACEMAKER INSERTION N/A 03/02/2012   Procedure: PERMANENT PACEMAKER INSERTION;  Surgeon: Thompson Grayer, MD;  Location: [facility] CATH LAB;  Service: Cardiovascular;  Laterality: N/A;  . POLYPECTOMY    . TEMPORARY PACEMAKER INSERTION N/A 03/01/2012   Procedure: TEMPORARY PACEMAKER INSERTION;  Surgeon: Burnell Blanks, MD;  Location: [facility] CATH [facility];  Service: Cardiovascular;  Laterality: N/A;  . [place]  . TOTAL KNEE ARTHROPLASTY Right 01/07/2014   Procedure: RIGHT TOTAL KNEE ARTHROPLASTY;  Surgeon: Mauri Pole, MD;  Location: [facility] ORS;  Service: Orthopedics;  Laterality: Right;    Current Outpatient Medications  Medication Sig Dispense Refill  . aspirin EC 81 MG tablet Take 81 mg by mouth daily.    Marland Kitchen atorvastatin (LIPITOR) 40 MG tablet TAKE 1 TABLET BY MOUTH DAILY 90 tablet 1  . Cholecalciferol (D3-1000) 1000 UNITS capsule Take 2,000 Units by mouth daily.     . clindamycin (CLEOCIN) 300 MG capsule Take 600 mg by mouth as directed. For dental only    . estradiol (ESTRACE) 2 MG tablet Take 2 mg by mouth at bedtime.     . fluticasone (FLONASE) 50 MCG/ACT nasal spray Place 2 sprays into both nostrils daily. 16 g 1  . norethindrone (AYGESTIN) 5 MG tablet Take 2.5 mg by mouth. Once Every 4 months for 10 days    . omega-3 acid ethyl esters (LOVAZA) 1 G capsule Take  2 g by mouth at bedtime.    . Probiotic Product (SOLUBLE FIBER/PROBIOTICS PO) Take 1 capsule by mouth at bedtime.     . tretinoin microspheres (RETIN-A MICRO) 0.1 % gel Apply 1 application topically at bedtime.     Marland Kitchen levocetirizine (XYZAL) 5 MG tablet Take 1 tablet (5 mg total) by mouth every evening. (Patient not taking: Reported on 07/05/2018) 30 tablet 3   No current facility-administered medications for this visit.     Allergies:   Amoxicillin   Social History:  The patient  reports that she quit smoking about 11 years ago. Her smoking use included cigarettes. She has a 21.00 pack-year smoking history. She  has never used smokeless tobacco. She reports that she does not drink alcohol or use drugs.   Family History:  The patient's  family history includes Colon polyps in her father; Heart attack (age of onset: 74) in her father; Heart attack (age of onset: 40) in her mother; Heart disease in an other family member.   ROS:  Please see the history of present illness.   All other systems are personally reviewed and negative.    Exam:    Vital Signs:  BP 140/65   Pulse 74   Temp 98.4 F (36.9 C)   Wt 133 lb (60.3 kg)   LMP  (LMP Unknown) Comment: every 4 months take norethrine to clean out uterus   BMI 22.83 kg/m   Well appearing, alert and conversant, regular work of breathing,  good skin color Eyes- anicteric, neuro- grossly intact, skin- no apparent rash or lesions or cyanosis, mouth- oral mucosa is pink   Labs/Other Tests and Data Reviewed:    Recent Labs: 12/14/2017: Hemoglobin 12.6; Platelets 182.0; TSH 1.98 06/14/2018: ALT 14; BUN 18; Creatinine, Ser 0.72; Potassium 4.2; Sodium 138   Wt Readings from Last 3 Encounters:  07/05/18 133 lb (60.3 kg)  06/14/18 131 lb 8 oz (59.6 kg)  05/03/18 137 lb (62.1 kg)     Other studies personally reviewed: Additional studies/ records that were reviewed today include: Dr Raphael Gibney notes, recent echo  Review of the above records today demonstrates: as above  Last device remote is reviewed from [place] PDF dated 04/26/2018 which reveals normal device function, no arrhythmias    ASSESSMENT & PLAN:    1.  Complete heart block Remotes are up to date Normal device function EF 45%, recent echo is reviewed as well as Dr Raphael Gibney notes She has no CHF symptoms.  Primary concern is with peri-valvular leak We at some point may need to consider device upgrade to CRT-P.  For now, we will follow conservatively as Dr Michaelle Birks determines best course for her peri-valvular leak  2. S/p AVR with peri-valvular leak As above  3. Nonischemic CM NYHA class  I currently As above Could consider ACEi/Coreg, will defer to Dr Michaelle Birks  4. NSVT Rare and asymptomatic  5. COVID 19 screen The patient denies symptoms of COVID 19 at this time.  The importance of social distancing was discussed today.  Follow-up:  12 months with me Next remote: 06/2018  Current medicines are reviewed at length with the patient today.   The patient does not have concerns regarding her medicines.  The following changes were made today:  none  Labs/ tests ordered today include:  No orders of the defined types were placed in this encounter.  Patient Risk:  after full review of this patients clinical status, I feel that they are at moderate risk at  this time.  Today, I have spent 15 minutes with the patient with telehealth technology discussing device management and CHF .    SignedThompson Grayer, MD  07/05/2018 1:27 PM     [address] [place] [address] [phone] (office) [phone] (fax)

## 2018-07-10 ENCOUNTER — Encounter: Payer: Medicare Other | Admitting: Internal Medicine

## 2018-07-26 ENCOUNTER — Other Ambulatory Visit: Payer: Self-pay

## 2018-07-26 ENCOUNTER — Ambulatory Visit (INDEPENDENT_AMBULATORY_CARE_PROVIDER_SITE_OTHER): Payer: Medicare Other | Admitting: *Deleted

## 2018-07-26 DIAGNOSIS — I442 Atrioventricular block, complete: Secondary | ICD-10-CM | POA: Diagnosis not present

## 2018-07-26 DIAGNOSIS — I472 Ventricular tachycardia: Secondary | ICD-10-CM

## 2018-07-26 DIAGNOSIS — I4729 Other ventricular tachycardia: Secondary | ICD-10-CM

## 2018-07-27 LAB — CUP PACEART REMOTE DEVICE CHECK
Battery Impedance: 646 Ohm
Battery Remaining Longevity: 73 mo
Battery Voltage: 2.79 V
Brady Statistic AP VP Percent: 28 %
Brady Statistic AP VS Percent: 0 %
Brady Statistic AS VP Percent: 72 %
Brady Statistic AS VS Percent: 0 %
Date Time Interrogation Session: 20200429150454
Implantable Lead Implant Date: 20131205
Implantable Lead Implant Date: 20131205
Implantable Lead Location: 753859
Implantable Lead Location: 753860
Implantable Lead Model: 5076
Implantable Lead Model: 5092
Implantable Pulse Generator Implant Date: 20131205
Lead Channel Impedance Value: 399 Ohm
Lead Channel Impedance Value: 588 Ohm
Lead Channel Pacing Threshold Amplitude: 0.5 V
Lead Channel Pacing Threshold Amplitude: 0.625 V
Lead Channel Pacing Threshold Pulse Width: 0.4 ms
Lead Channel Pacing Threshold Pulse Width: 0.4 ms
Lead Channel Sensing Intrinsic Amplitude: 1 mV
Lead Channel Setting Pacing Amplitude: 2 V
Lead Channel Setting Pacing Amplitude: 2.5 V
Lead Channel Setting Pacing Pulse Width: 0.4 ms
Lead Channel Setting Sensing Sensitivity: 2.8 mV

## 2018-08-04 NOTE — Progress Notes (Signed)
Remote pacemaker transmission.   

## 2018-11-09 ENCOUNTER — Encounter (HOSPITAL_COMMUNITY): Payer: Self-pay | Admitting: Emergency Medicine

## 2018-11-09 ENCOUNTER — Emergency Department (HOSPITAL_COMMUNITY): Payer: Medicare Other

## 2018-11-09 ENCOUNTER — Other Ambulatory Visit: Payer: Self-pay

## 2018-11-09 ENCOUNTER — Inpatient Hospital Stay (HOSPITAL_COMMUNITY)
Admission: EM | Admit: 2018-11-09 | Discharge: 2018-11-11 | DRG: 919 | Disposition: A | Discharge status: Other Acute Inpatient Hospital | Payer: Medicare Other | Attending: Internal Medicine | Admitting: Internal Medicine

## 2018-11-09 DIAGNOSIS — T8111XA Postprocedural  cardiogenic shock, initial encounter: Principal | ICD-10-CM | POA: Diagnosis present

## 2018-11-09 DIAGNOSIS — I480 Paroxysmal atrial fibrillation: Secondary | ICD-10-CM | POA: Diagnosis not present

## 2018-11-09 DIAGNOSIS — I428 Other cardiomyopathies: Secondary | ICD-10-CM | POA: Diagnosis present

## 2018-11-09 DIAGNOSIS — I2510 Atherosclerotic heart disease of native coronary artery without angina pectoris: Secondary | ICD-10-CM | POA: Diagnosis present

## 2018-11-09 DIAGNOSIS — Z87891 Personal history of nicotine dependence: Secondary | ICD-10-CM

## 2018-11-09 DIAGNOSIS — Z8249 Family history of ischemic heart disease and other diseases of the circulatory system: Secondary | ICD-10-CM

## 2018-11-09 DIAGNOSIS — Z79891 Long term (current) use of opiate analgesic: Secondary | ICD-10-CM | POA: Diagnosis not present

## 2018-11-09 DIAGNOSIS — E876 Hypokalemia: Secondary | ICD-10-CM | POA: Diagnosis not present

## 2018-11-09 DIAGNOSIS — I5032 Chronic diastolic (congestive) heart failure: Secondary | ICD-10-CM

## 2018-11-09 DIAGNOSIS — J9601 Acute respiratory failure with hypoxia: Secondary | ICD-10-CM | POA: Diagnosis present

## 2018-11-09 DIAGNOSIS — E872 Acidosis: Secondary | ICD-10-CM | POA: Diagnosis present

## 2018-11-09 DIAGNOSIS — Z79899 Other long term (current) drug therapy: Secondary | ICD-10-CM | POA: Diagnosis not present

## 2018-11-09 DIAGNOSIS — Z880 Allergy status to penicillin: Secondary | ICD-10-CM

## 2018-11-09 DIAGNOSIS — Z950 Presence of cardiac pacemaker: Secondary | ICD-10-CM | POA: Diagnosis not present

## 2018-11-09 DIAGNOSIS — Z952 Presence of prosthetic heart valve: Secondary | ICD-10-CM

## 2018-11-09 DIAGNOSIS — I509 Heart failure, unspecified: Secondary | ICD-10-CM

## 2018-11-09 DIAGNOSIS — I5023 Acute on chronic systolic (congestive) heart failure: Secondary | ICD-10-CM | POA: Diagnosis present

## 2018-11-09 DIAGNOSIS — J810 Acute pulmonary edema: Secondary | ICD-10-CM

## 2018-11-09 DIAGNOSIS — I5021 Acute systolic (congestive) heart failure: Secondary | ICD-10-CM | POA: Diagnosis not present

## 2018-11-09 DIAGNOSIS — Z96651 Presence of right artificial knee joint: Secondary | ICD-10-CM | POA: Diagnosis present

## 2018-11-09 DIAGNOSIS — Z8371 Family history of colonic polyps: Secondary | ICD-10-CM

## 2018-11-09 DIAGNOSIS — Z7982 Long term (current) use of aspirin: Secondary | ICD-10-CM

## 2018-11-09 DIAGNOSIS — R0602 Shortness of breath: Secondary | ICD-10-CM

## 2018-11-09 DIAGNOSIS — Z20828 Contact with and (suspected) exposure to other viral communicable diseases: Secondary | ICD-10-CM | POA: Diagnosis present

## 2018-11-09 DIAGNOSIS — Z7951 Long term (current) use of inhaled steroids: Secondary | ICD-10-CM | POA: Diagnosis not present

## 2018-11-09 DIAGNOSIS — R570 Cardiogenic shock: Secondary | ICD-10-CM | POA: Diagnosis not present

## 2018-11-09 LAB — COMPREHENSIVE METABOLIC PANEL
ALT: 56 U/L — ABNORMAL HIGH (ref 0–44)
AST: 67 U/L — ABNORMAL HIGH (ref 15–41)
Albumin: 2.7 g/dL — ABNORMAL LOW (ref 3.5–5.0)
Alkaline Phosphatase: 128 U/L — ABNORMAL HIGH (ref 38–126)
Anion gap: 18 — ABNORMAL HIGH (ref 5–15)
BUN: 48 mg/dL — ABNORMAL HIGH (ref 8–23)
CO2: 16 mmol/L — ABNORMAL LOW (ref 22–32)
Calcium: 9.4 mg/dL (ref 8.9–10.3)
Chloride: 102 mmol/L (ref 98–111)
Creatinine, Ser: 1.36 mg/dL — ABNORMAL HIGH (ref 0.44–1.00)
GFR calc Af Amer: 45 mL/min — ABNORMAL LOW (ref >60)
GFR calc non Af Amer: 39 mL/min — ABNORMAL LOW (ref >60)
Glucose, Bld: 213 mg/dL — ABNORMAL HIGH (ref 70–99)
Potassium: 4.4 mmol/L (ref 3.5–5.1)
Sodium: 136 mmol/L (ref 135–145)
Total Bilirubin: 1.1 mg/dL (ref 0.3–1.2)
Total Protein: 6.7 g/dL (ref 6.5–8.1)

## 2018-11-09 LAB — POCT I-STAT EG7
Acid-base deficit: 8 mmol/L — ABNORMAL HIGH (ref 0.0–2.0)
Bicarbonate: 18.5 mmol/L — ABNORMAL LOW (ref 20.0–28.0)
Calcium, Ion: 1.28 mmol/L (ref 1.15–1.40)
HCT: 34 % — ABNORMAL LOW (ref 36.0–46.0)
Hemoglobin: 11.6 g/dL — ABNORMAL LOW (ref 12.0–15.0)
O2 Saturation: 96 %
Potassium: 4.4 mmol/L (ref 3.5–5.1)
Sodium: 135 mmol/L (ref 135–145)
TCO2: 20 mmol/L — ABNORMAL LOW (ref 22–32)
pCO2, Ven: 39.8 mmHg — ABNORMAL LOW (ref 44.0–60.0)
pH, Ven: 7.276 (ref 7.250–7.430)
pO2, Ven: 89 mmHg — ABNORMAL HIGH (ref 32.0–45.0)

## 2018-11-09 LAB — CBC WITH DIFFERENTIAL/PLATELET
Abs Immature Granulocytes: 0.11 10*3/uL — ABNORMAL HIGH (ref 0.00–0.07)
Basophils Absolute: 0 10*3/uL (ref 0.0–0.1)
Basophils Relative: 0 %
Eosinophils Absolute: 0 10*3/uL (ref 0.0–0.5)
Eosinophils Relative: 0 %
HCT: 33.9 % — ABNORMAL LOW (ref 36.0–46.0)
Hemoglobin: 10.4 g/dL — ABNORMAL LOW (ref 12.0–15.0)
Immature Granulocytes: 1 %
Lymphocytes Relative: 16 %
Lymphs Abs: 2.2 10*3/uL (ref 0.7–4.0)
MCH: 29.3 pg (ref 26.0–34.0)
MCHC: 30.7 g/dL (ref 30.0–36.0)
MCV: 95.5 fL (ref 80.0–100.0)
Monocytes Absolute: 0.7 10*3/uL (ref 0.1–1.0)
Monocytes Relative: 5 %
Neutro Abs: 10.5 10*3/uL — ABNORMAL HIGH (ref 1.7–7.7)
Neutrophils Relative %: 78 %
Platelets: 595 10*3/uL — ABNORMAL HIGH (ref 150–400)
RBC: 3.55 MIL/uL — ABNORMAL LOW (ref 3.87–5.11)
RDW: 16.9 % — ABNORMAL HIGH (ref 11.5–15.5)
WBC: 13.5 10*3/uL — ABNORMAL HIGH (ref 4.0–10.5)
nRBC: 0 % (ref 0.0–0.2)

## 2018-11-09 LAB — I-STAT CHEM 8, ED
BUN: 44 mg/dL — ABNORMAL HIGH (ref 8–23)
Calcium, Ion: 1.11 mmol/L — ABNORMAL LOW (ref 1.15–1.40)
Chloride: 106 mmol/L (ref 98–111)
Creatinine, Ser: 1.2 mg/dL — ABNORMAL HIGH (ref 0.44–1.00)
Glucose, Bld: 211 mg/dL — ABNORMAL HIGH (ref 70–99)
HCT: 32 % — ABNORMAL LOW (ref 36.0–46.0)
Hemoglobin: 10.9 g/dL — ABNORMAL LOW (ref 12.0–15.0)
Potassium: 4.3 mmol/L (ref 3.5–5.1)
Sodium: 134 mmol/L — ABNORMAL LOW (ref 135–145)
TCO2: 18 mmol/L — ABNORMAL LOW (ref 22–32)

## 2018-11-09 LAB — TROPONIN I (HIGH SENSITIVITY): Troponin I (High Sensitivity): 115 ng/L (ref <18)

## 2018-11-09 LAB — SARS CORONAVIRUS 2 BY RT PCR (HOSPITAL ORDER, PERFORMED IN ~~LOC~~ HOSPITAL LAB): SARS Coronavirus 2: NEGATIVE

## 2018-11-09 LAB — BRAIN NATRIURETIC PEPTIDE: B Natriuretic Peptide: 3145.6 pg/mL — ABNORMAL HIGH (ref 0.0–100.0)

## 2018-11-09 MED ORDER — HEPARIN SODIUM (PORCINE) 5000 UNIT/ML IJ SOLN
5000.0000 [IU] | Freq: Three times a day (TID) | INTRAMUSCULAR | Status: DC
  Filled 2018-11-09: qty 1

## 2018-11-09 MED ORDER — ONDANSETRON HCL 4 MG/2ML IJ SOLN
4.0000 mg | Freq: Four times a day (QID) | INTRAMUSCULAR | Status: DC | PRN
  Administered 2018-11-11: 4 mg via INTRAVENOUS
  Filled 2018-11-09: qty 2

## 2018-11-09 MED ORDER — ACETAMINOPHEN 325 MG PO TABS
650.0000 mg | ORAL_TABLET | ORAL | Status: DC | PRN
  Administered 2018-11-10 – 2018-11-11 (×3): 650 mg via ORAL
  Filled 2018-11-09 (×2): qty 2

## 2018-11-09 MED ORDER — MILRINONE LACTATE IN DEXTROSE 20-5 MG/100ML-% IV SOLN
0.2500 ug/kg/min | INTRAVENOUS | Status: DC
  Filled 2018-11-09: qty 100

## 2018-11-09 MED ORDER — SODIUM CHLORIDE 0.9 % IV SOLN
250.0000 mL | INTRAVENOUS | Status: DC | PRN
  Administered 2018-11-10: 250 mL via INTRAVENOUS

## 2018-11-09 MED ORDER — SODIUM CHLORIDE 0.9% FLUSH
3.0000 mL | Freq: Two times a day (BID) | INTRAVENOUS | Status: DC
  Administered 2018-11-10: 12:00:00 3 mL via INTRAVENOUS

## 2018-11-09 MED ORDER — FUROSEMIDE 10 MG/ML IJ SOLN
80.0000 mg | Freq: Once | INTRAMUSCULAR | Status: AC
  Administered 2018-11-09: 22:00:00 80 mg via INTRAVENOUS
  Filled 2018-11-09: qty 8

## 2018-11-09 MED ORDER — ASPIRIN EC 81 MG PO TBEC
81.0000 mg | DELAYED_RELEASE_TABLET | Freq: Every day | ORAL | Status: DC
  Administered 2018-11-10 – 2018-11-11 (×2): 81 mg via ORAL
  Filled 2018-11-09 (×2): qty 1

## 2018-11-09 MED ORDER — SODIUM CHLORIDE 0.9% FLUSH: 3.0000 mL | INTRAVENOUS | Status: DC | PRN

## 2018-11-09 MED ORDER — NITROGLYCERIN IN D5W 200-5 MCG/ML-% IV SOLN
0.0000 ug/min | INTRAVENOUS | Status: DC
  Administered 2018-11-09: 20 ug/min via INTRAVENOUS
  Filled 2018-11-09: qty 250

## 2018-11-09 MED ORDER — MILRINONE LACTATE IN DEXTROSE 20-5 MG/100ML-% IV SOLN
0.1250 ug/kg/min | INTRAVENOUS | Status: DC
  Administered 2018-11-10: 0.125 ug/kg/min via INTRAVENOUS
  Filled 2018-11-09: qty 100

## 2018-11-09 NOTE — Progress Notes (Signed)
  Echocardiogram 2D Echocardiogram has been performed.  Angel Waller 11/09/2018, 11:39 PM

## 2018-11-09 NOTE — ED Triage Notes (Signed)
Pt BIB [facility] from home, c/o sudden onset shortness of breath that started suddenly tonight. Denies chest pain. Hx aortic valve replacement at [facility] x 2 weeks ago. SpO2 80s on room air, placed on NRB, improved to 98%. Pt clammy, and pale. Swelling to BLE and bruising to right leg. EMS BP 160/100. Given 3 NTG PTA.

## 2018-11-09 NOTE — ED Provider Notes (Signed)
[facility] EMERGENCY DEPARTMENT Provider Note   CSN: [id number] Arrival date & time: 11/09/18  2146    History   Chief Complaint Chief Complaint  Patient presents with  . Shortness of Breath    HPI Angel Waller is a 73 y.o. female.     HPI  Patient presents via EMS for shortness of breath and hypoxia.  EMS reports that the patient confirms that around 2 weeks ago she had aortic valve replacement with aortic root repair as well.  She had returned home and been well.  Was walking earlier today and had rapid onset shortness of breath while walking.  Has had increasing bilateral lower extremity edema.  In route given 3 nitro and placed on oxygen.  EMS EKG showed ventricular pacing at a rate of 130.  Patient denies other constitutional symptoms of illness.  Past Medical History:  Diagnosis Date  . Allergy   . Aortic stenosis   . Arthritis    "right knee" (02/29/2012)  . Bicuspid aortic valve 09/2008   STATUS POST BIOPROSTHETIC AORTIC VALVE REPLACEMENT  . Blood transfusion without reported diagnosis   . Complete heart block ([place])   . Heart murmur   . Hyperlipemia   . Migraines    "occasionally" (02/29/2012)  . Osteopenia   . Pacemaker   . S/P AVR (aortic valve replacement)    July 2010  . Seasonal allergies     Patient Active Problem List   Diagnosis Date Noted  . CHF (congestive heart failure) ([place]) 11/09/2018  . Postoperative cardiogenic shock ([place]) 11/09/2018  . Knee pain, right 04/01/2014  . S/P right TKA 01/07/2014  . Routine general medical examination at a health care facility 06/13/2013  . Pacemaker-Medtronic 03/02/2012  . Sinus pause 03/01/2012  . Complete heart block ([place]) 03/01/2012  . Syncope 02/29/2012  . ALLERGIC RHINITIS, SEASONAL 02/24/2010  . Hyperlipidemia 02/19/2009  . Status post aortic valve replacement 10/15/2008    Past Surgical History:  Procedure Laterality Date  . CARDIAC VALVE REPLACEMENT  2010   tissue aortic  valve; [facility]  . COLONOSCOPY  2005   negative; [place], [place]  . INSERT / REPLACE / REMOVE PACEMAKER    . KNEE ARTHROSCOPY Right   . PERMANENT PACEMAKER INSERTION  03/02/2012   Medtronic Adapta L implanted by Dr Rayann Heman  . PERMANENT PACEMAKER INSERTION N/A 03/02/2012   Procedure: PERMANENT PACEMAKER INSERTION;  Surgeon: Thompson Grayer, MD;  Location: [facility] CATH LAB;  Service: Cardiovascular;  Laterality: N/A;  . POLYPECTOMY    . TEMPORARY PACEMAKER INSERTION N/A 03/01/2012   Procedure: TEMPORARY PACEMAKER INSERTION;  Surgeon: Burnell Blanks, MD;  Location: [facility] CATH LAB;  Service: Cardiovascular;  Laterality: N/A;  . [place]  . TOTAL KNEE ARTHROPLASTY Right 01/07/2014   Procedure: RIGHT TOTAL KNEE ARTHROPLASTY;  Surgeon: Mauri Pole, MD;  Location: [facility] ORS;  Service: Orthopedics;  Laterality: Right;     OB History   No obstetric history on file.      Home Medications    Prior to Admission medications   Medication Sig Start Date End Date Taking? Authorizing Provider  aspirin EC 81 MG tablet Take 81 mg by mouth daily.    [provider]  atorvastatin (LIPITOR) 40 MG tablet TAKE 1 TABLET BY MOUTH DAILY 05/30/17   Midge Minium, MD  Cholecalciferol (D3-1000) 1000 UNITS capsule Take 2,000 Units by mouth daily.     [provider]  clindamycin (CLEOCIN) 300 MG  capsule Take 600 mg by mouth as directed. For dental only    [provider]  estradiol (ESTRACE) 2 MG tablet Take 2 mg by mouth at bedtime.     [provider]  fluticasone (FLONASE) 50 MCG/ACT nasal spray Place 2 sprays into both nostrils daily. 11/24/17   Saguier, Percell Miller, PA-C  levocetirizine (XYZAL) 5 MG tablet Take 1 tablet (5 mg total) by mouth every evening. Patient not taking: Reported on 07/05/2018 11/24/17   Saguier, Percell Miller, PA-C  norethindrone (AYGESTIN) 5 MG tablet Take 2.5 mg by mouth. Once Every 4 months for 10 days    [provider]  omega-3  acid ethyl esters (LOVAZA) 1 G capsule Take 2 g by mouth at bedtime.    [provider]  Probiotic Product (SOLUBLE FIBER/PROBIOTICS PO) Take 1 capsule by mouth at bedtime.     [provider]  tretinoin microspheres (RETIN-A MICRO) 0.1 % gel Apply 1 application topically at bedtime.  09/20/12   [provider]    Family History Family History  Problem Relation Age of Onset  . Heart attack Mother 71       CABG  . Heart attack Father 62       aneurysm post MI  . Colon polyps Father   . Heart disease Other        in both M & P uncles & aunts; no premature MI  . Diabetes Neg Hx   . Stroke Neg Hx   . Colon cancer Neg Hx   . Pancreatic cancer Neg Hx   . Rectal cancer Neg Hx   . Stomach cancer Neg Hx     Social History Social History   Tobacco Use  . Smoking status: Former Smoker    Packs/day: 0.50    Years: 42.00    Pack years: 21.00    Types: Cigarettes    Quit date: 03/30/2007    Years since quitting: 11.6  . Smokeless tobacco: Never Used  . Tobacco comment: smoked ages [age], up to 1 pp WEEK  Substance Use Topics  . Alcohol use: No  . Drug use: No     Allergies   Amoxicillin   Review of Systems Review of Systems  Unable to perform ROS: Acuity of condition  Respiratory: Positive for chest tightness, shortness of breath and wheezing.   Cardiovascular: Positive for leg swelling.     Physical Exam Updated Vital Signs BP 129/89   Pulse (!) 123   Resp (!) 28   Wt 68 kg   LMP  (LMP Unknown) Comment: every 4 months take norethrine to clean out uterus   SpO2 98%   BMI 25.73 kg/m   Physical Exam Vitals signs and nursing note reviewed.  Constitutional:      General: She is in acute distress.     Appearance: She is well-developed. She is toxic-appearing and diaphoretic.  HENT:     Head: Normocephalic and atraumatic.  Eyes:     Conjunctiva/sclera: Conjunctivae normal.  Neck:     Musculoskeletal: Neck supple.  Cardiovascular:      Rate and Rhythm: Regular rhythm. Tachycardia present.     Heart sounds: Gallop present.   Pulmonary:     Effort: Tachypnea, accessory muscle usage and respiratory distress present.     Breath sounds: Rales present.     Comments: Midline sternotomy scar, healing well, no sign of infection Abdominal:     Palpations: Abdomen is soft.     Tenderness: There is no  abdominal tenderness.  Skin:    General: Skin is warm.     Comments: Extremities are cold and clammy  Neurological:     Mental Status: She is alert.  Psychiatric:        Mood and Affect: Mood is anxious.      ED Treatments / Results  Labs (all labs ordered are listed, but only abnormal results are displayed) Labs Reviewed  CBC WITH DIFFERENTIAL/PLATELET - Abnormal; Notable for the following components:      Result Value   WBC 13.5 (*)    RBC 3.55 (*)    Hemoglobin 10.4 (*)    HCT 33.9 (*)    RDW 16.9 (*)    Platelets 595 (*)    Neutro Abs 10.5 (*)    Abs Immature Granulocytes 0.11 (*)    All other components within normal limits  BRAIN NATRIURETIC PEPTIDE - Abnormal; Notable for the following components:   B Natriuretic Peptide 3,145.6 (*)    All other components within normal limits  LACTIC ACID, PLASMA - Abnormal; Notable for the following components:   Lactic Acid, Venous 5.4 (*)    All other components within normal limits  COMPREHENSIVE METABOLIC PANEL - Abnormal; Notable for the following components:   CO2 16 (*)    Glucose, Bld 213 (*)    BUN 48 (*)    Creatinine, Ser 1.36 (*)    Albumin 2.7 (*)    AST 67 (*)    ALT 56 (*)    Alkaline Phosphatase 128 (*)    GFR calc non Af Amer 39 (*)    GFR calc Af Amer 45 (*)    Anion gap 18 (*)    All other components within normal limits  I-STAT CHEM 8, ED - Abnormal; Notable for the following components:   Sodium 134 (*)    BUN 44 (*)    Creatinine, Ser 1.20 (*)    Glucose, Bld 211 (*)    Calcium, Ion 1.11 (*)    TCO2 18 (*)    Hemoglobin 10.9 (*)    HCT  32.0 (*)    All other components within normal limits  POCT I-STAT EG7 - Abnormal; Notable for the following components:   pCO2, Ven 39.8 (*)    pO2, Ven 89.0 (*)    Bicarbonate 18.5 (*)    TCO2 20 (*)    Acid-base deficit 8.0 (*)    HCT 34.0 (*)    Hemoglobin 11.6 (*)    All other components within normal limits  POCT I-STAT EG7 - Abnormal; Notable for the following components:   pCO2, Ven 42.0 (*)    pO2, Ven 28.0 (*)    Acid-base deficit 5.0 (*)    HCT 34.0 (*)    Hemoglobin 11.6 (*)    All other components within normal limits  TROPONIN I (HIGH SENSITIVITY) - Abnormal; Notable for the following components:   Troponin I (High Sensitivity) 115 (*)    All other components within normal limits  SARS CORONAVIRUS 2 (HOSPITAL ORDER, [place] LAB)  LACTIC ACID, PLASMA  BASIC METABOLIC PANEL  CBC  CREATININE, SERUM  COOXEMETRY PANEL  COOXEMETRY PANEL  LACTIC ACID, PLASMA  LACTIC ACID, PLASMA  BLOOD GAS, VENOUS  I-STAT VENOUS BLOOD GAS, ED  TROPONIN I (HIGH SENSITIVITY)    EKG EKG Interpretation  Date/Time:  Thursday November 09 2018 22:07:12 EDT Ventricular Rate:  125 PR Interval:    QRS Duration: 208 QT Interval:  406  QTC Calculation: 586 R Axis:   -65 Text Interpretation:  Ventricular-paced rhythm LVH with IVCD, LAD and secondary repol abnrm Prolonged QT interval Confirmed by Quintella Reichert [phone]) on 11/09/2018 10:16:50 PM   Radiology Dg Chest Port 1 View  Result Date: 11/09/2018 CLINICAL DATA:  Short of breath EXAM: PORTABLE CHEST 1 VIEW COMPARISON:  03/19/2014. FINDINGS: Post sternotomy changes with cutaneous staples and valve prosthesis. Left-sided pacing device is noted. Small right and small moderate left pleural effusion with dense airspace disease at the lingula and left base. Enlarged cardiomediastinal silhouette with vascular congestion and moderate diffuse interstitial opacities suspicious for pulmonary edema. No pneumothorax.  IMPRESSION: 1. Cardiomegaly with vascular congestion and moderate diffuse interstitial opacities suspicious for pulmonary edema. There are small right and small moderate left pleural effusions. 2. Dense consolidation at the lingula and left base which may reflect superimposed atelectasis or pneumonia Electronically Signed   By: Donavan Foil M.D.   On: 11/09/2018 22:14    Procedures Ultrasound ED Echo  Date/Time: 11/10/2018 12:13 AM Performed by: Tillie Fantasia, MD Authorized by: Tillie Fantasia, MD   Procedure details:    Indications: dyspnea     Views: parasternal long axis view, parasternal short axis view and IVC view     Images: archived     Limitations:  Positioning and patient compliance (Patient unable to lie flat) Findings:    Pericardium: small pericardial effusion     LV Function: severly depressed (<30%)     RV Diameter: dilated     IVC: dilated   Impression:    Impression: probable elevated CVP and decreased contractility     (including critical care time)  Medications Ordered in ED Medications  aspirin EC tablet 81 mg (has no administration in time range)  sodium chloride flush (NS) 0.9 % injection 3 mL (has no administration in time range)  sodium chloride flush (NS) 0.9 % injection 3 mL (has no administration in time range)  0.9 %  sodium chloride infusion (has no administration in time range)  acetaminophen (TYLENOL) tablet 650 mg (has no administration in time range)  ondansetron (ZOFRAN) injection 4 mg (has no administration in time range)  heparin injection 5,000 Units (has no administration in time range)  milrinone (PRIMACOR) 20 MG/100 ML (0.2 mg/mL) infusion (0.125 mcg/kg/min  68 kg Intravenous New Bag/Given 11/10/18 0007)  furosemide (LASIX) injection 80 mg (80 mg Intravenous Given 11/09/18 2228)     Initial Impression / Assessment and Plan / ED Course  I have reviewed the triage vital signs and the nursing notes.  Pertinent labs & imaging results that  were available during my care of the patient were reviewed by me and considered in my medical decision making (see chart for details).        Ms. Pedretti is a 73 year old female with a history of complete heart block status post pacemaker, and on 7/26 had aortic valve replacement with aortic root repair at [facility] system.  I have reviewed her medical records.  On exam the patient was in extremis with hypoxic respiratory failure.  Unfortunately, per hospital policy was unable to place on BiPAP until negative code.  In the meantime, placed on high flow nasal cannula.  Exam is most consistent with pulmonary edema.  She was not terribly hypertensive.  Proceeded with work-up.  Chest x-ray returned quickly and showed pulmonary edema.  Consulted cardiology and started nitro and Lasix.  Both of these resulted in some improvement.  I performed bedside echo which  was negative for large effusion.  Did show at least moderately decreased ejection fraction.  Proceed with formal echo.  COVID resulted negative and patient was placed on BiPAP with continued gradual improvement.  Labs notable for hyperglycemia, elevated BUN and creatinine from baseline consistent with AKI and a prerenal pattern.  Leukocytosis of 13.5 with redemonstrated normocytic anemia with hemoglobin 10.4, essentially baseline.  VBG showed normal pH.  There is respiratory alkalosis with metabolic compensation as lactic acid is 5.4.  This is more consistent with cardiogenic shock than sepsis.  Cardiogenic shock further suggested by BNP of 3145.  There was suggestion of atelectasis versus infectious consolidation of the left lingula and lower lobe, but this is much less consistent with her story and I have not treated with empiric antibiotics.  Patient continued to have improvement in her clinical state but given her severity of illness, cardiology ultimately admitted to ICU.  Final Clinical Impressions(s) / ED Diagnoses    Final  diagnoses:  Acute respiratory failure with hypoxia ([place])  H/O aortic valve replacement  Congestive heart failure, unspecified HF chronicity, unspecified heart failure type [facility])  Acute pulmonary edema [facility])    ED Discharge Orders    None       Tillie Fantasia, MD 11/10/18 [place]    Quintella Reichert, MD 11/10/18 [phone]

## 2018-11-09 NOTE — ED Notes (Signed)
Cards at bedside

## 2018-11-10 ENCOUNTER — Inpatient Hospital Stay: Payer: Self-pay

## 2018-11-10 ENCOUNTER — Ambulatory Visit: Payer: Medicare Other | Admitting: Physician Assistant

## 2018-11-10 DIAGNOSIS — R570 Cardiogenic shock: Secondary | ICD-10-CM

## 2018-11-10 DIAGNOSIS — I5021 Acute systolic (congestive) heart failure: Secondary | ICD-10-CM

## 2018-11-10 DIAGNOSIS — I480 Paroxysmal atrial fibrillation: Secondary | ICD-10-CM

## 2018-11-10 DIAGNOSIS — T8111XA Postprocedural  cardiogenic shock, initial encounter: Principal | ICD-10-CM

## 2018-11-10 LAB — COOXEMETRY PANEL
Carboxyhemoglobin: 0.9 % (ref 0.5–1.5)
Methemoglobin: 0.6 % (ref 0.0–1.5)
O2 Saturation: 64.5 %
Total hemoglobin: 15.8 g/dL (ref 12.0–16.0)

## 2018-11-10 LAB — CBC
HCT: 34 % — ABNORMAL LOW (ref 36.0–46.0)
Hemoglobin: 10.5 g/dL — ABNORMAL LOW (ref 12.0–15.0)
MCH: 29.3 pg (ref 26.0–34.0)
MCHC: 30.9 g/dL (ref 30.0–36.0)
MCV: 95 fL (ref 80.0–100.0)
Platelets: 549 10*3/uL — ABNORMAL HIGH (ref 150–400)
RBC: 3.58 MIL/uL — ABNORMAL LOW (ref 3.87–5.11)
RDW: 16.6 % — ABNORMAL HIGH (ref 11.5–15.5)
WBC: 13.6 10*3/uL — ABNORMAL HIGH (ref 4.0–10.5)
nRBC: 0 % (ref 0.0–0.2)

## 2018-11-10 LAB — MRSA PCR SCREENING: MRSA by PCR: NEGATIVE

## 2018-11-10 LAB — COMPREHENSIVE METABOLIC PANEL
ALT: 69 U/L — ABNORMAL HIGH (ref 0–44)
AST: 89 U/L — ABNORMAL HIGH (ref 15–41)
Albumin: 2.7 g/dL — ABNORMAL LOW (ref 3.5–5.0)
Alkaline Phosphatase: 122 U/L (ref 38–126)
Anion gap: 15 (ref 5–15)
BUN: 52 mg/dL — ABNORMAL HIGH (ref 8–23)
CO2: 23 mmol/L (ref 22–32)
Calcium: 9.4 mg/dL (ref 8.9–10.3)
Chloride: 99 mmol/L (ref 98–111)
Creatinine, Ser: 1.33 mg/dL — ABNORMAL HIGH (ref 0.44–1.00)
GFR calc Af Amer: 46 mL/min — ABNORMAL LOW (ref >60)
GFR calc non Af Amer: 40 mL/min — ABNORMAL LOW (ref >60)
Glucose, Bld: 149 mg/dL — ABNORMAL HIGH (ref 70–99)
Potassium: 4.1 mmol/L (ref 3.5–5.1)
Sodium: 137 mmol/L (ref 135–145)
Total Bilirubin: 1 mg/dL (ref 0.3–1.2)
Total Protein: 6.5 g/dL (ref 6.5–8.1)

## 2018-11-10 LAB — LACTIC ACID, PLASMA
Lactic Acid, Venous: 2.5 mmol/L (ref 0.5–1.9)
Lactic Acid, Venous: 5.1 mmol/L (ref 0.5–1.9)
Lactic Acid, Venous: 5.4 mmol/L (ref 0.5–1.9)

## 2018-11-10 LAB — POCT I-STAT EG7
Acid-base deficit: 5 mmol/L — ABNORMAL HIGH (ref 0.0–2.0)
Bicarbonate: 20.7 mmol/L (ref 20.0–28.0)
Calcium, Ion: 1.24 mmol/L (ref 1.15–1.40)
HCT: 34 % — ABNORMAL LOW (ref 36.0–46.0)
Hemoglobin: 11.6 g/dL — ABNORMAL LOW (ref 12.0–15.0)
O2 Saturation: 46 %
Potassium: 4.3 mmol/L (ref 3.5–5.1)
Sodium: 138 mmol/L (ref 135–145)
TCO2: 22 mmol/L (ref 22–32)
pCO2, Ven: 42 mmHg — ABNORMAL LOW (ref 44.0–60.0)
pH, Ven: 7.301 (ref 7.250–7.430)
pO2, Ven: 28 mmHg — CL (ref 32.0–45.0)

## 2018-11-10 LAB — GLUCOSE, CAPILLARY: Glucose-Capillary: 96 mg/dL (ref 70–99)

## 2018-11-10 LAB — CREATININE, SERUM
Creatinine, Ser: 1.51 mg/dL — ABNORMAL HIGH (ref 0.44–1.00)
GFR calc Af Amer: 39 mL/min — ABNORMAL LOW (ref >60)
GFR calc non Af Amer: 34 mL/min — ABNORMAL LOW (ref >60)

## 2018-11-10 LAB — ECHOCARDIOGRAM LIMITED

## 2018-11-10 LAB — PROTIME-INR
INR: 1.4 — ABNORMAL HIGH (ref 0.8–1.2)
Prothrombin Time: 16.6 seconds — ABNORMAL HIGH (ref 11.4–15.2)

## 2018-11-10 LAB — TROPONIN I (HIGH SENSITIVITY): Troponin I (High Sensitivity): 124 ng/L (ref <18)

## 2018-11-10 LAB — HEPARIN LEVEL (UNFRACTIONATED): Heparin Unfractionated: 0.35 IU/mL (ref 0.30–0.70)

## 2018-11-10 MED ORDER — FAMOTIDINE IN NACL 20-0.9 MG/50ML-% IV SOLN
20.0000 mg | INTRAVENOUS | Status: DC
  Administered 2018-11-10 – 2018-11-11 (×2): 20 mg via INTRAVENOUS
  Filled 2018-11-10 (×3): qty 50

## 2018-11-10 MED ORDER — FUROSEMIDE 10 MG/ML IJ SOLN
80.0000 mg | Freq: Once | INTRAMUSCULAR | Status: AC
  Administered 2018-11-10: 80 mg via INTRAVENOUS
  Filled 2018-11-10: qty 8

## 2018-11-10 MED ORDER — POTASSIUM CHLORIDE CRYS ER 20 MEQ PO TBCR
20.0000 meq | EXTENDED_RELEASE_TABLET | Freq: Once | ORAL | Status: AC
  Administered 2018-11-10: 17:00:00 20 meq via ORAL
  Filled 2018-11-10: qty 1

## 2018-11-10 MED ORDER — ORAL CARE MOUTH RINSE: 15.0000 mL | Freq: Two times a day (BID) | OROMUCOSAL | Status: DC

## 2018-11-10 MED ORDER — MILRINONE LACTATE IN DEXTROSE 20-5 MG/100ML-% IV SOLN
0.2500 ug/kg/min | INTRAVENOUS | Status: DC
  Administered 2018-11-10 – 2018-11-11 (×3): 0.25 ug/kg/min via INTRAVENOUS
  Filled 2018-11-10 (×3): qty 100

## 2018-11-10 MED ORDER — CHLORHEXIDINE GLUCONATE 0.12 % MT SOLN
15.0000 mL | Freq: Two times a day (BID) | OROMUCOSAL | Status: DC
  Administered 2018-11-10 – 2018-11-11 (×3): 15 mL via OROMUCOSAL
  Filled 2018-11-10 (×2): qty 15

## 2018-11-10 MED ORDER — CHLORHEXIDINE GLUCONATE CLOTH 2 % EX PADS: 6.0000 | MEDICATED_PAD | Freq: Every day | CUTANEOUS | Status: DC

## 2018-11-10 MED ORDER — SODIUM CHLORIDE 0.9% FLUSH: 10.0000 mL | INTRAVENOUS | Status: DC | PRN

## 2018-11-10 MED ORDER — HEPARIN (PORCINE) 25000 UT/250ML-% IV SOLN
900.0000 [IU]/h | INTRAVENOUS | Status: DC
  Administered 2018-11-10 (×2): 1000 [IU]/h via INTRAVENOUS
  Filled 2018-11-10 (×2): qty 250

## 2018-11-10 MED ORDER — SODIUM CHLORIDE 0.9% FLUSH
10.0000 mL | Freq: Two times a day (BID) | INTRAVENOUS | Status: DC
  Administered 2018-11-10: 12:00:00 20 mL

## 2018-11-10 NOTE — H&P (Signed)
Cardiology Admission History and Physical:   Patient ID: [name] MRN: [id number]; DOB: [birth year]   Admission date: 11/09/2018  Primary Care Provider: Midge Minium, MD Primary Cardiologist: Jamse Arn Primary Electrophysiologist:  None   Chief Complaint:  Shortness of Breath.   Patient Profile:   Angel Waller is a 73 y.o. female with CHB s/p dual chamber MDT PPM, bicuspid AV s/p AVR (2010) c/b paravalvular leak who is now POD 18 s/p Bentall at [facility]. She presents with one day of increasing SOB and LE edema, found to be in cardiogenic shock.    History of Present Illness:   Briefly, Angel Waller has been followed by [facility] Jamse Arn, MD) for valvular disease and presumed valvular cardiomyopathy (previous EF ~ 40%). She was last seen in clinic in early July of this year and was stable, with NYHA I-II symptoms. Repeat TTE performed notable for some new regional wall motion abnormalities and uptrending non-invasive assessment of PA pressures. She was admitted on 7/20 and underwent RHC LHC which was notable for RA 5, mPA 25, PCWP 20 with large V wave suggestive of MR. Coronary angiography revealed non-significant LAD disease and aortogram suggestive of a dilated root with at least moderate AI. Follow up TEE notable for a rocking/partially dehisced prothesis with severe paravalvular AI. Surgery was consulted and she underwent redo ascending aorta and root replacement with coronary reconstruction Deneen Harts) on 10/23/18. Her intra-op course was complicated by significant coagulopathy requiring multiple transfusions. After surgery she developed ATN requiring CVVHD. Paroxysmal atrial arrhythmias were treated with amiodarone, but this was not continued at the time of discharge. She returned home on 10/31/18 with cardiovascular medications including lasix 20mg  daily.   Per the patient and her husband she was in her discharge state of health until  yesterday, when she began to notice increase LE swelling and mild dyspnea. While walking on the day of admission, she developed acute worsening of her SOB and ultimately activated EMS who brought her to [facility] ED.   On arrival, patient was in extemis with RR 40s hypoxic to the 80s. Initial BP 469G systolic. EKG A-sense V-paced in 120s. CXR with pulmonary edema. She was started on a lasix drip, given 80mg  IV lasix x 1, and placed on bipap. Initial labs notable for Cr 1.3, AST/ALT 67/56. Bicarb 16 with lactate 5.5. STAT bedside echo performed and most notable for severly reduced EF (25%) with regional WMAs. eRVSP 44 with reasonable RV function. No acute valvular pathology: aortic valve appears well seated without evidence of AI. There is moderate MR, which    Past Medical History:  Diagnosis Date  . Allergy   . Aortic stenosis   . Arthritis    "right knee" (02/29/2012)  . Bicuspid aortic valve 09/2008   STATUS POST BIOPROSTHETIC AORTIC VALVE REPLACEMENT  . Blood transfusion without reported diagnosis   . Complete heart block ([place])   . Heart murmur   . Hyperlipemia   . Migraines    "occasionally" (02/29/2012)  . Osteopenia   . Pacemaker   . S/P AVR (aortic valve replacement)    July 2010  . Seasonal allergies     Past Surgical History:  Procedure Laterality Date  . CARDIAC VALVE REPLACEMENT  2010   tissue aortic valve; [facility]  . COLONOSCOPY  2005   negative; [place], [place]  . INSERT / REPLACE / REMOVE PACEMAKER    . KNEE ARTHROSCOPY Right   . PERMANENT PACEMAKER INSERTION  03/02/2012  Medtronic Adapta L implanted by Dr Rayann Heman  . PERMANENT PACEMAKER INSERTION N/A 03/02/2012   Procedure: PERMANENT PACEMAKER INSERTION;  Surgeon: Thompson Grayer, MD;  Location: [facility] CATH LAB;  Service: Cardiovascular;  Laterality: N/A;  . POLYPECTOMY    . TEMPORARY PACEMAKER INSERTION N/A 03/01/2012   Procedure: TEMPORARY PACEMAKER INSERTION;  Surgeon: Burnell Blanks, MD;  Location: [facility] CATH LAB;  Service:  Cardiovascular;  Laterality: N/A;  . [place]  . TOTAL KNEE ARTHROPLASTY Right 01/07/2014   Procedure: RIGHT TOTAL KNEE ARTHROPLASTY;  Surgeon: Mauri Pole, MD;  Location: [facility] ORS;  Service: Orthopedics;  Laterality: Right;     Medications Prior to Admission: Prior to Admission medications   Medication Sig Start Date End Date Taking? Authorizing Provider  aspirin EC 81 MG tablet Take 81 mg by mouth daily.   Yes [provider]  atorvastatin (LIPITOR) 40 MG tablet TAKE 1 TABLET BY MOUTH DAILY Patient taking differently: Take 40 mg by mouth daily.  05/30/17  Yes Midge Minium, MD  Cholecalciferol (D3-1000) 1000 UNITS capsule Take 2,000 Units by mouth daily.    Yes [provider]  clindamycin (CLEOCIN) 300 MG capsule Take 600 mg by mouth daily as needed (two hours before dental appointments).    Yes [provider]  estradiol (ESTRACE) 2 MG tablet Take 2 mg by mouth at bedtime.    Yes [provider]  ferrous sulfate 325 (65 FE) MG tablet Take 325 mg by mouth 2 (two) times daily with a meal.   Yes [provider]  fluticasone (FLONASE) 50 MCG/ACT nasal spray Place 2 sprays into both nostrils daily. Patient taking differently: Place 2 sprays into both nostrils daily as needed for allergies.  11/24/17  Yes Saguier, Percell Miller, PA-C  folic acid (FOLVITE) 1 MG tablet Take 1 mg by mouth daily.   Yes [provider]  furosemide (LASIX) 20 MG tablet Take 20 mg by mouth daily.   Yes [provider]  levocetirizine (XYZAL) 5 MG tablet Take 1 tablet (5 mg total) by mouth every evening. Patient taking differently: Take 5 mg by mouth daily as needed for allergies.  11/24/17  Yes Saguier, Percell Miller, PA-C  norethindrone (AYGESTIN) 5 MG tablet Take 5 mg by mouth See admin instructions. Once Every 4 months for 10 days   Yes [provider]  oxyCODONE (OXY IR/ROXICODONE) 5 MG immediate release tablet Take 5 mg by  mouth every 4 (four) hours as needed for severe pain.   Yes [provider]  polyethylene glycol (MIRALAX / GLYCOLAX) 17 g packet Take 17 g by mouth daily as needed for mild constipation.   Yes [provider]  Probiotic Product (SOLUBLE FIBER/PROBIOTICS PO) Take 1 capsule by mouth at bedtime.    Yes [provider]  senna (SENOKOT) 8.6 MG TABS tablet Take 2 tablets by mouth 2 (two) times daily as needed for mild constipation.   Yes [provider]  tretinoin microspheres (RETIN-A MICRO) 0.1 % gel Apply 1 application topically at bedtime.  09/20/12  Yes [provider]     Allergies:    Allergies  Allergen Reactions  . Amoxicillin Swelling    Social History:   Social History   Socioeconomic History  . Marital status: Married    Spouse name: Not on file  . Number of children: Not on file  . Years of education: Not on file  . Highest education level: Not on file  Occupational History  .  Occupation: Chief Technology Officer  Social Needs  . Financial resource strain: Not on file  . Food insecurity    Worry: Not on file    Inability: Not on file  . Transportation needs    Medical: Not on file    Non-medical: Not on file  Tobacco Use  . Smoking status: Former Smoker    Packs/day: 0.50    Years: 42.00    Pack years: 21.00    Types: Cigarettes    Quit date: 03/30/2007    Years since quitting: 11.6  . Smokeless tobacco: Never Used  . Tobacco comment: smoked ages [age], up to 1 pp WEEK  Substance and Sexual Activity  . Alcohol use: No  . Drug use: No  . Sexual activity: Yes  Lifestyle  . Physical activity    Days per week: Not on file    Minutes per session: Not on file  . Stress: Not on file  Relationships  . Social Herbalist on phone: Not on file    Gets together: Not on file    Attends religious service: Not on file    Active member of club or organization: Not on file    Attends meetings of clubs or  organizations: Not on file    Relationship status: Not on file  . Intimate partner violence    Fear of current or ex partner: Not on file    Emotionally abused: Not on file    Physically abused: Not on file    Forced sexual activity: Not on file  Other Topics Concern  . Not on file  Social History Narrative  . Not on file    Family History:   The patient's family history includes Colon polyps in her father; Heart attack (age of onset: 77) in her father; Heart attack (age of onset: 68) in her mother; Heart disease in an other family member. There is no history of Diabetes, Stroke, Colon cancer, Pancreatic cancer, Rectal cancer, or Stomach cancer.    Review of Systems: [y] = yes, [ ]  = no     General: Weight gain [ ] ; Weight loss [ ] ; Anorexia [ ] ; Fatigue [ ] ; Fever [ ] ; Chills [ ] ; Weakness [ ]    Cardiac: Chest pain/pressure [ ] ; Resting SOB [ ] ; Exertional SOB [ ] ; Orthopnea [ ] ; Pedal Edema [ ] ; Palpitations [ ] ; Syncope [ ] ; Presyncope [ ] ; Paroxysmal nocturnal dyspnea[ ]    Pulmonary: Cough [ ] ; Wheezing[ ] ; Hemoptysis[ ] ; Sputum [ ] ; Snoring [ ]    GI: Vomiting[ ] ; Dysphagia[ ] ; Melena[ ] ; Hematochezia [ ] ; Heartburn[ ] ; Abdominal pain [ ] ; Constipation [ ] ; Diarrhea [ ] ; BRBPR [ ]    GU: Hematuria[ ] ; Dysuria [ ] ; Nocturia[ ]    Vascular: Pain in legs with walking [ ] ; Pain in feet with lying flat [ ] ; Non-healing sores [ ] ; Stroke [ ] ; TIA [ ] ; Slurred speech [ ] ;   Neuro: Headaches[ ] ; Vertigo[ ] ; Seizures[ ] ; Paresthesias[ ] ;Blurred vision [ ] ; Diplopia [ ] ; Vision changes [ ]    Ortho/Skin: Arthritis [ ] ; Joint pain [ ] ; Muscle pain [ ] ; Joint swelling [ ] ; Back Pain [ ] ; Rash [ ]    Psych: Depression[ ] ; Anxiety[ ]    Heme: Bleeding problems [ ] ; Clotting disorders [ ] ; Anemia [ ]    Endocrine: Diabetes [ ] ; Thyroid dysfunction[ ]   Physical Exam/Data:   Vitals:   11/09/18 2330 11/09/18 2341 11/10/18 0015 11/10/18 0038  BP:  127/77 129/89 131/66   Pulse: (!) 122 (!) 123  69   Resp: (!) 33 (!) 28 (!) 26   Temp:    (!) 97 F (36.1 C)  TempSrc:    Axillary  SpO2: 97% 98% 100%   Weight:       No intake or output data in the 24 hours ending 11/10/18 0104 Filed Weights   11/09/18 2306  Weight: 68 kg   Body mass index is 25.73 kg/m.   General:  In Extremis. Tripoding, uncomfortable. Visibly dyspenic and ashen.  HEENT: normal Lymph: no adenopathy Neck:  JVD to earlobe when sitting upright.  Endocrine:  No thryomegaly Vascular: No carotid bruits; FA pulses 2+ bilaterally without bruits.  Cardiac:  Tachycardic S1, S2; II/IV holosystolic murmur RUSB.  Lungs:  Scattered rhonchi bilaterally.  Abd: soft, nontender, no hepatomegaly. Ext: 2-3+ LE edema.   Musculoskeletal:  No deformities, BUE and BLE strength normal and equal Skin: cool to the touch.  Neuro:  CNs 2-12 intact, no focal abnormalities noted Psych:  Normal affect   EKG:  A-sensed, v-paced at 120.   Relevant CV Studies: TTE 10/04/2018: LVEDD 5.4 LVEF 45% Severe LAE Normal RV size and function Moderate to Severe Paravalvular AI; Bioprosthetic AoV.  Moderate MR, Moderate TR.  Large V wave suggestive of MR.   RHC 10/16/18: CI 2.6 RA 5 mPA 25 PCWP 20 PVR 1.2  Coronary Angiography 10/16/18: "Moderate proximal LAD disease" "Distorted aortic root with at least moderate AI"  TTE (STAT TTE 11/10/18): 1. The left ventricle has severely reduced systolic function, with an ejection fraction of 20-25%. Left ventricular diastolic Doppler parameters are consistent with indeterminate diastolic dysfunction. There is abnormal septal motion consistent with RV  pacemaker.  2. Septal wall respiratory variation noted.     Marked tachycardia (Paced)     LV apical akinesis.  3. The right ventricle has normal systolc function. The cavity was normal. There is no increase in right ventricular wall thickness. Right ventricular systolic pressure is moderately elevated with an estimated pressure of 44.6 mmHg.   4. Left atrial size was mildly dilated.  5. The mitral valve is grossly normal. Mild thickening of the mitral valve leaflet. Mitral valve regurgitation is moderate by color flow Doppler. No evidence of mitral valve stenosis.  6. The tricuspid valve was grossly normal.  7. A bioprosthesis valve is present in the aortic position. Normal aortic valve prosthesis. Echo findings shows no evidence of rocking, dehiscence, regurgitation or perivalvular leak of the aortic prosthesis.  8. Bentall graft noted.  FINDINGS  Left Ventricle: The left ventricle has severely reduced systolic function, with an ejection fraction of 20-25%. There is no increase in left ventricular wall thickness. Left ventricular diastolic Doppler parameters are consistent with indeterminate  diastolic dysfunction. There is abnormal (paradoxical) septal motion, consistent with RV pacemaker. Septal wall respiratory variation noted. Marked tachycardia (Paced) LV apical akinesis.    Right Ventricle: The right ventricle has normal systolic function. The cavity was normal. There is no increase in right ventricular wall thickness. Right ventricular systolic pressure is moderately elevated with an estimated pressure of 44.6 mmHg.  Left Atrium: Left atrial size was mildly dilated.  Right Atrium: Right atrial size was normal in size. Right atrial pressure is estimated at 10 mmHg.  Interatrial Septum: No atrial level shunt detected by color flow Doppler.  Pericardium: There is no evidence of pericardial effusion.  Mitral Valve: The mitral valve is grossly normal. Mild thickening of the mitral valve  leaflet. Mitral valve regurgitation is moderate by color flow Doppler. No evidence of mitral valve stenosis.  Tricuspid Valve: The tricuspid valve was grossly normal. Tricuspid valve regurgitation is mild by color flow Doppler.  Aortic Valve: The aortic valve has been repaired/replaced Aortic valve regurgitation was not visualized by  color flow Doppler. A bioprosthetic aortic valve valve is present in the aortic position. Normal aortic valve prosthesis. Echo findings shows no  evidence of rocking, dehiscence, regurgitation or perivalvular leak of the aortic prosthesis.  Pulmonic Valve: The pulmonic valve was not well visualized. Pulmonic valve regurgitation was not assessed by color flow Doppler.  Aorta: Bentall graft noted.  Venous: The inferior vena cava was not well visualized.    +--------------+-------++ LEFT VENTRICLE        +--------------+-------++ PLAX 2D               +--------------+-------++ LVIDd:        4.90 cm +--------------+-------++ LVIDs:        4.10 cm +--------------+-------++ LV SV:        39 ml   +--------------+-------++ LV SV Index:  23.31   +--------------+-------++                       +--------------+-------++  +------------+-----------++ AORTIC VALVE            +------------+-----------++ LVOT Vmax:  86.40 cm/s  +------------+-----------++ LVOT Vmean: 57.800 cm/s +------------+-----------++ LVOT VTI:   0.146 m     +------------+-----------++   +-------------+-------++ AORTA                +-------------+-------++ Ao Root diam:3.50 cm +-------------+-------++  +----------------+-----------++ +---------------+-----------++ MR Peak grad:   128.1 mmHg  TRICUSPID VALVE            +----------------+-----------++ +---------------+-----------++ MR Mean grad:   71.0 mmHg   TR Peak grad:  34.6 mmHg   +----------------+-----------++ +---------------+-----------++ MR Vmax:        566.00 cm/s TR Vmax:       294.00 cm/s +----------------+-----------++ +---------------+-----------++ MR Vmean:       398.0 cm/s  +----------------+-----------++ +-------------+------+ MR PISA:        1.57 cm    SHUNTS              +----------------+-----------++ +-------------+------+ MR PISA Eff ROA:12  mm      Systemic VTI:0.15 m +----------------+-----------++ +-------------+------+ MR PISA Radius: 0.50 cm     +----------------+-----------++    Candee Furbish MD Electronically signed by Candee Furbish MD Signature Date/Time: 11/10/2018/12:31:19 AM  Laboratory Data:  Chemistry Recent Labs  Lab 11/09/18 2202  11/09/18 2212 11/10/18 0001 11/10/18 0015  NA 136   < > 134*  --  138  K 4.4   < > 4.3  --  4.3  CL 102  --  106  --   --   CO2 16*  --   --   --   --   GLUCOSE 213*  --  211*  --   --   BUN 48*  --  44*  --   --   CREATININE 1.36*  --  1.20* 1.51*  --   CALCIUM 9.4  --   --   --   --   GFRNONAA 39*  --   --  34*  --   GFRAA 45*  --   --  39*  --   ANIONGAP 18*  --   --   --   --    < > =  values in this interval not displayed.    Recent Labs  Lab 11/09/18 2202  PROT 6.7  ALBUMIN 2.7*  AST 67*  ALT 56*  ALKPHOS 128*  BILITOT 1.1   Hematology Recent Labs  Lab 11/09/18 2202  11/10/18 0001 11/10/18 0015  WBC 13.5*  --  13.6*  --   RBC 3.55*  --  3.58*  --   HGB 10.4*   < > 10.5* 11.6*  HCT 33.9*   < > 34.0* 34.0*  MCV 95.5  --  95.0  --   MCH 29.3  --  29.3  --   MCHC 30.7  --  30.9  --   RDW 16.9*  --  16.6*  --   PLT 595*  --  549*  --    < > = values in this interval not displayed.   Cardiac EnzymesNo results for input(s): TROPONINI in the last 168 hours. No results for input(s): TROPIPOC in the last 168 hours.  BNP Recent Labs  Lab 11/09/18 2203  BNP 3,145.6*    DDimer No results for input(s): DDIMER in the last 168 hours.  Radiology/Studies:  Dg Chest Port 1 View  Result Date: 11/09/2018 CLINICAL DATA:  Short of breath EXAM: PORTABLE CHEST 1 VIEW COMPARISON:  03/19/2014. FINDINGS: Post sternotomy changes with cutaneous staples and valve prosthesis. Left-sided pacing device is noted. Small right and small moderate left pleural effusion with dense airspace disease at the lingula and left base. Enlarged cardiomediastinal silhouette with  vascular congestion and moderate diffuse interstitial opacities suspicious for pulmonary edema. No pneumothorax. IMPRESSION: 1. Cardiomegaly with vascular congestion and moderate diffuse interstitial opacities suspicious for pulmonary edema. There are small right and small moderate left pleural effusions. 2. Dense consolidation at the lingula and left base which may reflect superimposed atelectasis or pneumonia Electronically Signed   By: Donavan Foil M.D.   On: 11/09/2018 22:14   [place] Ekg Site Rite  Result Date: 11/10/2018 If Site Rite image not attached, placement could not be confirmed due to current cardiac rhythm.   Assessment and Plan:   Ms. Clair is a 73 year old woman with CHB s/p PPM and AV disease s/p AVR (2010) and now POD 18 s/p redo Bentall who presents with subacute dyspnea, found to be in cardiogenic shock. On TTE, no evidence of valvular pathology related to recent surgery. No effusion. LV function, however, quantified today likely at 25% with regional WMAs. While WMAs were present prior to surgery in July, EF was then quantified at 40% suggesting decline in systolic function and consistent with her presentation. Clinically she has responded to NIPPV and initiation of Milrinone 0.125 -> 0.25 with lactate downtrending 5.4 ->5 and resolution of her respiratory distress. Overall presentation most likely consistent with post-cardiotomy shock. Will need to support with inotropes while initiating GDMT for systolic dysfunction (mindful of multiple recent hits to the kidneys) and ensure no inciting coronary event related to native disease or reimplanation of coronary buttons. Unclear how much moderate - severe MR playing a role.    #Acute Decompsenated Systolic HF #SCAI C-D Cardiogenic Shock -- Continue Milrinone 0.25 mcg/kg/min -- Plan for PICC placement for central access / Co-Ox in the AM -- Will make NPO for possible RHC + coronary angiography - the latter pending renal function -- Will  check q2 hour lactates to ensure trend continuing -- CMP in the AM -- Pending renal function should consider initiation of afterload reducing agents to improve MR, but mindful of need  for robust renal perfusion.  -- Wean bipap as tolerated.  -- 80mg  IV lasix BID for goal net negative 2L.   #Paroxysmal Atrial Fibrillation; Post-Operative -- CHADS-2-vasc score 3-4 -- Will plan to initiate heparin gtt for afib.  -- PPM programmed to mode switch; upper tracking rate set to 120.   The echo findings were discussed with on call physician Dr. Marlou Porch.  Dr. Sung Amabile made aware of cardiogenic shock patient. Plan above discussed.   Spoke to [employer] and surgeon Dr. Evelina Dun who will want to transfer patient to [facility] once she no long requires ICU level care (as there are no ICU beds).   Severity of Illness: The appropriate patient status for this patient is INPATIENT. Inpatient status is judged to be reasonable and necessary in order to provide the required intensity of service to ensure the patient's safety. The patient's presenting symptoms, physical exam findings, and initial radiographic and laboratory data in the context of their chronic comorbidities is felt to place them at high risk for further clinical deterioration. Furthermore, it is not anticipated that the patient will be medically stable for discharge from the hospital within 2 midnights of admission. The following factors support the patient status of inpatient.   " The patient's presenting symptoms include shortness of breath.  " The worrisome physical exam findings include pulmonary edema.  " The initial radiographic and laboratory data are worrisome because of elevated lactate.  " The chronic co-morbidities include heart failure.    * I certify that at the point of admission it is my clinical judgment that the patient will require inpatient hospital care spanning beyond 2 midnights from the point of admission due to high  intensity of service, high risk for further deterioration and high frequency of surveillance required.*    For questions or updates, please contact [place] Please consult [email or web address] for contact info under    Signed, Milus Banister, MD  11/10/2018 1:04 AM

## 2018-11-10 NOTE — Progress Notes (Signed)
ANTICOAGULATION CONSULT NOTE - Initial Consult  Pharmacy Consult for Heparin Indication: atrial fibrillation  Allergies  Allergen Reactions  . Amoxicillin Swelling    Patient Measurements: Height: 5\' 4"  (162.6 cm) Weight: 152 lb 5.4 oz (69.1 kg) IBW/kg (Calculated) : 54.7  Vital Signs: Temp: 97 F (36.1 C) (08/14 0038) Temp Source: Axillary (08/14 0038) BP: 141/75 (08/14 0200) Pulse Rate: 69 (08/14 0015)  Labs: Recent Labs    11/09/18 2202  11/09/18 2212 11/10/18 0001 11/10/18 0015  HGB 10.4*   < > 10.9* 10.5* 11.6*  HCT 33.9*   < > 32.0* 34.0* 34.0*  PLT 595*  --   --  549*  --   CREATININE 1.36*  --  1.20* 1.51*  --   TROPONINIHS 115*  --   --  124*  --    < > = values in this interval not displayed.    Estimated Creatinine Clearance: 31.7 mL/min (A) (by C-G formula based on SCr of 1.51 mg/dL (H)).   Medical History: Past Medical History:  Diagnosis Date  . Allergy   . Aortic stenosis   . Arthritis    "right knee" (02/29/2012)  . Bicuspid aortic valve 09/2008   STATUS POST BIOPROSTHETIC AORTIC VALVE REPLACEMENT  . Blood transfusion without reported diagnosis   . Complete heart block ([place])   . Heart murmur   . Hyperlipemia   . Migraines    "occasionally" (02/29/2012)  . Osteopenia   . Pacemaker   . S/P AVR (aortic valve replacement)    July 2010  . Seasonal allergies     Medications:  Medications Prior to Admission  Medication Sig Dispense Refill Last Dose  . aspirin EC 81 MG tablet Take 81 mg by mouth daily.   11/09/2018 at unk  . atorvastatin (LIPITOR) 40 MG tablet TAKE 1 TABLET BY MOUTH DAILY (Patient taking differently: Take 40 mg by mouth daily. ) 90 tablet 1 11/09/2018 at Unknown time  . Cholecalciferol (D3-1000) 1000 UNITS capsule Take 2,000 Units by mouth daily.    11/09/2018 at Unknown time  . clindamycin (CLEOCIN) 300 MG capsule Take 600 mg by mouth daily as needed (two hours before dental appointments).    unk  . estradiol (ESTRACE) 2 MG  tablet Take 2 mg by mouth at bedtime.    11/09/2018 at Unknown time  . ferrous sulfate 325 (65 FE) MG tablet Take 325 mg by mouth 2 (two) times daily with a meal.   11/09/2018 at Unknown time  . fluticasone (FLONASE) 50 MCG/ACT nasal spray Place 2 sprays into both nostrils daily. (Patient taking differently: Place 2 sprays into both nostrils daily as needed for allergies. ) 16 g 1 Past Month at Unknown time  . folic acid (FOLVITE) 1 MG tablet Take 1 mg by mouth daily.   11/09/2018 at Unknown time  . furosemide (LASIX) 20 MG tablet Take 20 mg by mouth daily.   11/09/2018 at Unknown time  . levocetirizine (XYZAL) 5 MG tablet Take 1 tablet (5 mg total) by mouth every evening. (Patient taking differently: Take 5 mg by mouth daily as needed for allergies. ) 30 tablet 3 Past Month at Unknown time  . norethindrone (AYGESTIN) 5 MG tablet Take 5 mg by mouth See admin instructions. Once Every 4 months for 10 days   unk  . oxyCODONE (OXY IR/ROXICODONE) 5 MG immediate release tablet Take 5 mg by mouth every 4 (four) hours as needed for severe pain.   Past Week at Unknown time  .  polyethylene glycol (MIRALAX / GLYCOLAX) 17 g packet Take 17 g by mouth daily as needed for mild constipation.   unk  . Probiotic Product (SOLUBLE FIBER/PROBIOTICS PO) Take 1 capsule by mouth at bedtime.    11/09/2018 at Unknown time  . senna (SENOKOT) 8.6 MG TABS tablet Take 2 tablets by mouth 2 (two) times daily as needed for mild constipation.   unk  . tretinoin microspheres (RETIN-A MICRO) 0.1 % gel Apply 1 application topically at bedtime.    11/09/2018 at Unknown time    Assessment: 73 y.o. female with CHF and new onst Afib s/p Bentall procedure 7/28 for heparin Goal of Therapy:  Heparin level 0.3-0.7 units/ml Monitor platelets by anticoagulation protocol: Yes   Plan:  Start heparin 1000 units/hr Check heparin level in 8 hours.   Caryl Pina 11/10/2018,2:15 AM

## 2018-11-10 NOTE — Progress Notes (Signed)
Peripherally Inserted Central Catheter/Midline Placement  The IV Nurse has discussed with the patient and/or persons authorized to consent for the patient, the purpose of this procedure and the potential benefits and risks involved with this procedure.  The benefits include less needle sticks, lab draws from the catheter, and the patient may be discharged home with the catheter. Risks include, but not limited to, infection, bleeding, blood clot (thrombus formation), and puncture of an artery; nerve damage and irregular heartbeat and possibility to perform a PICC exchange if needed/ordered by physician.  Alternatives to this procedure were also discussed.  Bard Power PICC patient education guide, fact sheet on infection prevention and patient information card has been provided to patient /or left at bedside.    PICC/Midline Placement Documentation  PICC Double Lumen 91/79/15 PICC Right Basilic 38 cm 1 cm (Active)  Indication for Insertion or Continuance of Line Vasoactive infusions 11/10/18 0800  Exposed Catheter (cm) 1 cm 11/10/18 0800  Site Assessment Clean;Dry;Intact 11/10/18 0800  Lumen #1 Status Flushed;Blood return noted 11/10/18 0800  Lumen #2 Status Flushed;Blood return noted 11/10/18 0800  Dressing Type Transparent 11/10/18 0800  Dressing Status Clean;Dry;Intact;Antimicrobial disc in place 11/10/18 0800  Dressing Change Due 11/17/18 11/10/18 0800       Jule Economy Horton 11/10/2018, 8:54 AM

## 2018-11-10 NOTE — Progress Notes (Signed)
Pt transported from [facility] to 5L10 with no complications.

## 2018-11-10 NOTE — Progress Notes (Signed)
CRITICAL VALUE ALERT  Critical Value:  Lactic Acid 5.1  Date & Time Notied:  11/10/2018 0055  Provider Notified: Marletta Lor  Orders Received/Actions taken: See new/modified orders

## 2018-11-10 NOTE — Progress Notes (Signed)
Advanced Heart Failure Rounding Note   Subjective:    Patient admitted early this am with severe respiratory distress in setting of worsening HF and cardiogenic shock.   Initial lactate 5.4. CXR with diffuse pulmonary edema.   Still on BIPAP this am. On milrinone 0.25. MV sat 64% this am. Now diuresing well and starting to feel better. Lactate down to 2.5. hstrop 124.   Echo EF 20-25% with septal dysynchrony RV ok AVR stable no AI. ? Small flap in Asc Ao   Objective:   Weight Range:  Vital Signs:   Temp:  [97 F (36.1 C)-98.2 F (36.8 C)] 98.2 F (36.8 C) (08/14 1000) Pulse Rate:  [47-130] 70 (08/14 1106) Resp:  [16-37] 24 (08/14 1400) BP: (115-157)/(61-100) 115/61 (08/14 1400) SpO2:  [91 %-100 %] 100 % (08/14 1400) FiO2 (%):  [30 %-60 %] 30 % (08/14 1058) Weight:  [68 kg-69.1 kg] 69.1 kg (08/14 0100) Last BM Date: 11/09/18  Weight change: Filed Weights   11/09/18 2306 11/10/18 0100  Weight: 68 kg 69.1 kg    Intake/Output:   Intake/Output Summary (Last 24 hours) at 11/10/2018 1448 Last data filed at 11/10/2018 1300 Gross per 24 hour  Intake 259.52 ml  Output 1535 ml  Net -1275.48 ml     Physical Exam: General:  Elderly woman on BIPAP HEENT: normal Neck: supple. JVP to jaw . Carotids 2+ bilat; no bruits. No lymphadenopathy or thryomegaly appreciated. Cor: PMI laterally displaced. Regular rate & rhythm. No rubs, gallops or murmurs. s2 crisp Lungs: +crackles Abdomen: soft, nontender, nondistended. No hepatosplenomegaly. No bruits or masses. Good bowel sounds. Extremities: no cyanosis, clubbing, rash, 1-2+ edema Neuro: alert & orientedx3, cranial nerves grossly intact. moves all 4 extremities w/o difficulty. Affect pleasant  Telemetry: AF with v pacing  70s Personally reviewed   Labs: Basic Metabolic Panel: Recent Labs  Lab 11/09/18 2202 11/09/18 2211 11/09/18 2212 11/10/18 0001 11/10/18 0015 11/10/18 0251  NA 136 135 134*  --  138 137  K 4.4 4.4  4.3  --  4.3 4.1  CL 102  --  106  --   --  99  CO2 16*  --   --   --   --  23  GLUCOSE 213*  --  211*  --   --  149*  BUN 48*  --  44*  --   --  52*  CREATININE 1.36*  --  1.20* 1.51*  --  1.33*  CALCIUM 9.4  --   --   --   --  9.4    Liver Function Tests: Recent Labs  Lab 11/09/18 2202 11/10/18 0251  AST 67* 89*  ALT 56* 69*  ALKPHOS 128* 122  BILITOT 1.1 1.0  PROT 6.7 6.5  ALBUMIN 2.7* 2.7*   No results for input(s): LIPASE, AMYLASE in the last 168 hours. No results for input(s): AMMONIA in the last 168 hours.  CBC: Recent Labs  Lab 11/09/18 2202 11/09/18 2211 11/09/18 2212 11/10/18 0001 11/10/18 0015  WBC 13.5*  --   --  13.6*  --   NEUTROABS 10.5*  --   --   --   --   HGB 10.4* 11.6* 10.9* 10.5* 11.6*  HCT 33.9* 34.0* 32.0* 34.0* 34.0*  MCV 95.5  --   --  95.0  --   PLT 595*  --   --  549*  --     Cardiac Enzymes: No results for input(s): CKTOTAL, CKMB, CKMBINDEX, TROPONINI in  the last 168 hours.  BNP: BNP (last 3 results) Recent Labs    11/09/18 2203  BNP 3,145.6*    ProBNP (last 3 results) No results for input(s): PROBNP in the last 8760 hours.    Other results:  Imaging: Dg Chest Port 1 View  Result Date: 11/09/2018 CLINICAL DATA:  Short of breath EXAM: PORTABLE CHEST 1 VIEW COMPARISON:  03/19/2014. FINDINGS: Post sternotomy changes with cutaneous staples and valve prosthesis. Left-sided pacing device is noted. Small right and small moderate left pleural effusion with dense airspace disease at the lingula and left base. Enlarged cardiomediastinal silhouette with vascular congestion and moderate diffuse interstitial opacities suspicious for pulmonary edema. No pneumothorax. IMPRESSION: 1. Cardiomegaly with vascular congestion and moderate diffuse interstitial opacities suspicious for pulmonary edema. There are small right and small moderate left pleural effusions. 2. Dense consolidation at the lingula and left base which may reflect superimposed  atelectasis or pneumonia Electronically Signed   By: Donavan Foil M.D.   On: 11/09/2018 22:14   [place] Ekg Site Rite  Result Date: 11/10/2018 If Site Rite image not attached, placement could not be confirmed due to current cardiac rhythm.     Medications:     Scheduled Medications: . aspirin EC  81 mg Oral Daily  . chlorhexidine  15 mL Mouth Rinse BID  . Chlorhexidine Gluconate Cloth  6 each Topical Daily  . mouth rinse  15 mL Mouth Rinse q12n4p  . sodium chloride flush  10-40 mL Intracatheter Q12H  . sodium chloride flush  3 mL Intravenous Q12H     Infusions: . sodium chloride Stopped (11/10/18 0243)  . famotidine (PEPCID) IV 20 mg (11/10/18 1030)  . heparin 1,000 Units/hr (11/10/18 0243)  . milrinone 0.25 mcg/kg/min (11/10/18 1325)     PRN Medications:  sodium chloride, acetaminophen, ondansetron (ZOFRAN) IV, sodium chloride flush, sodium chloride flush   Assessment/Plan:   1. Cardiogenic shock 2. Acute on chronic systolic HF     - EF 27-51% -> 20-25% 3. Acute hypoxic respiratory failure 4. H/o bicuspid AoV (s/p previous AVR c/b perivalvular leak)    - now 2 weeks out from re-do AVR Bentall at [facility] 5. PAF  6. H/o HB s/p PPM   Echo shows markedly reduced LV function which is down from previous. She presented with low output HF/shock and acidosis. Now improving with milrinone, BIPAP and diuresis. Etiology of worsening LV dysfunction unclear but doubt ischemic event in setting of normal hstrop. ? AF, post-cardiotomy or RV pacing  Will continue milrinone and IV diuresis. Wean BIPAP. Continue heparin.   Will likely need TEE or chest CT to further evaluate possible flap in ascending Ao on echo.   Case d/w Dr. Evelina Dun overnight and pending transfer back to [facility] when bed available.   CRITICAL CARE Performed by: Glori Bickers  Total critical care time: 35 minutes  Critical care time was exclusive of separately billable procedures and treating other patients.   Critical care was necessary to treat or prevent imminent or life-threatening deterioration.  Critical care was time spent personally by me (independent of midlevel providers or residents) on the following activities: development of treatment plan with patient and/or surrogate as well as nursing, discussions with consultants, evaluation of patient's response to treatment, examination of patient, obtaining history from patient or surrogate, ordering and performing treatments and interventions, ordering and review of laboratory studies, ordering and review of radiographic studies, pulse oximetry and re-evaluation of patient's condition.    Length of Stay: 1  Glori Bickers MD 11/10/2018, 2:48 PM  Advanced Heart Failure Team Pager [phone] (M-F; 7a - 4p)  Please contact [facility] for night-coverage after hours (4p -7a ) and weekends on [email or web address]

## 2018-11-10 NOTE — Progress Notes (Signed)
RT NOTE: RT placed patient on 3L [place] with RN at bedside. Patient tolerating being off the bipap well. Per patient her breathing is comfortable. No distress noted. RT will continue to monitor as needed.

## 2018-11-10 NOTE — Progress Notes (Signed)
Angel Waller for Heparin Indication: atrial fibrillation  Allergies  Allergen Reactions  . Amoxicillin Swelling    Patient Measurements: Height: 5\' 4"  (162.6 cm) Weight: 152 lb 5.4 oz (69.1 kg) IBW/kg (Calculated) : 54.7  Vital Signs: Temp: 98.2 F (36.8 C) (08/14 1000) Temp Source: Axillary (08/14 1000) BP: 142/74 (08/14 1100) Pulse Rate: 70 (08/14 1106)  Labs: Recent Labs    11/09/18 2202  11/09/18 2212 11/10/18 0001 11/10/18 0015 11/10/18 0251 11/10/18 1016  HGB 10.4*   < > 10.9* 10.5* 11.6*  --   --   HCT 33.9*   < > 32.0* 34.0* 34.0*  --   --   PLT 595*  --   --  549*  --   --   --   LABPROT  --   --   --   --   --   --  16.6*  INR  --   --   --   --   --   --  1.4*  HEPARINUNFRC  --   --   --   --   --   --  0.35  CREATININE 1.36*  --  1.20* 1.51*  --  1.33*  --   TROPONINIHS 115*  --   --  124*  --   --   --    < > = values in this interval not displayed.    Estimated Creatinine Clearance: 36 mL/min (A) (by C-G formula based on SCr of 1.33 mg/dL (H)).   Medical History: Past Medical History:  Diagnosis Date  . Allergy   . Aortic stenosis   . Arthritis    "right knee" (02/29/2012)  . Bicuspid aortic valve 09/2008   STATUS POST BIOPROSTHETIC AORTIC VALVE REPLACEMENT  . Blood transfusion without reported diagnosis   . Complete heart block ([place])   . Heart murmur   . Hyperlipemia   . Migraines    "occasionally" (02/29/2012)  . Osteopenia   . Pacemaker   . S/P AVR (aortic valve replacement)    July 2010  . Seasonal allergies     Medications:  Medications Prior to Admission  Medication Sig Dispense Refill Last Dose  . aspirin EC 81 MG tablet Take 81 mg by mouth daily.   11/09/2018 at unk  . atorvastatin (LIPITOR) 40 MG tablet TAKE 1 TABLET BY MOUTH DAILY (Patient taking differently: Take 40 mg by mouth daily. ) 90 tablet 1 11/09/2018 at Unknown time  . Cholecalciferol (D3-1000) 1000 UNITS capsule Take 2,000 Units by  mouth daily.    11/09/2018 at Unknown time  . clindamycin (CLEOCIN) 300 MG capsule Take 600 mg by mouth daily as needed (two hours before dental appointments).    unk  . estradiol (ESTRACE) 2 MG tablet Take 2 mg by mouth at bedtime.    11/09/2018 at Unknown time  . ferrous sulfate 325 (65 FE) MG tablet Take 325 mg by mouth 2 (two) times daily with a meal.   11/09/2018 at Unknown time  . fluticasone (FLONASE) 50 MCG/ACT nasal spray Place 2 sprays into both nostrils daily. (Patient taking differently: Place 2 sprays into both nostrils daily as needed for allergies. ) 16 g 1 Past Month at Unknown time  . folic acid (FOLVITE) 1 MG tablet Take 1 mg by mouth daily.   11/09/2018 at Unknown time  . furosemide (LASIX) 20 MG tablet Take 20 mg by mouth daily.   11/09/2018 at Unknown time  . levocetirizine (XYZAL)  5 MG tablet Take 1 tablet (5 mg total) by mouth every evening. (Patient taking differently: Take 5 mg by mouth daily as needed for allergies. ) 30 tablet 3 Past Month at Unknown time  . norethindrone (AYGESTIN) 5 MG tablet Take 5 mg by mouth See admin instructions. Once Every 4 months for 10 days   unk  . oxyCODONE (OXY IR/ROXICODONE) 5 MG immediate release tablet Take 5 mg by mouth every 4 (four) hours as needed for severe pain.   Past Week at Unknown time  . polyethylene glycol (MIRALAX / GLYCOLAX) 17 g packet Take 17 g by mouth daily as needed for mild constipation.   unk  . Probiotic Product (SOLUBLE FIBER/PROBIOTICS PO) Take 1 capsule by mouth at bedtime.    11/09/2018 at Unknown time  . senna (SENOKOT) 8.6 MG TABS tablet Take 2 tablets by mouth 2 (two) times daily as needed for mild constipation.   unk  . tretinoin microspheres (RETIN-A MICRO) 0.1 % gel Apply 1 application topically at bedtime.    11/09/2018 at Unknown time    Assessment: 73 y.o. female with CHF and new onset Afib s/p Bentall procedure 7/28. Pharmacy has been consulted for heparin dosing.  Initial heparin level is therapeutic at 0.36  on 1000 units/hr.  Hgb this AM was 10.5, pltc 549  Goal of Therapy:  Heparin level 0.3-0.7 units/ml Monitor platelets by anticoagulation protocol: Yes   Plan:  Continue heparin IV at 1000 units/hr May transfer to [place] later today Will check heparin level in AM if still admitted to [place], PharmD PGY2 Cardiology Pharmacy Resident Phone [phone] 11/10/2018       12:46 PM  Please check [email or web address] for unit-specific pharmacist phone numbers

## 2018-11-10 NOTE — Discharge Summary (Signed)
Advanced Heart Failure Team  Discharge Summary   Patient ID: Angel Waller MRN: [id number], DOB/AGE: 11/18/1945 73 y.o. Admit date: 11/09/2018 D/C date:     11/11/2018   Primary Discharge Diagnoses:  1. Cardiogenic Shock  2. A/C Systolic HF 3. Acute Hyposic Respiratory Failure  4. [facility]  Hospital Course:  [name] is a 73 year old woman with h/o CHB s/p PPM and bicuspid AoV s/p AVR (2010) who underwent redo Bentall nearly 3 weeks ago at [facility] (10/23/18) due to perivalvular AI. She presented to [facility] with several days of progressive dyspnea and severe LE edema. On arrival to ER found to be in cardiogenic shock with severe respiratory distress and lactic acid of 5.4. Bedside TTE in ER, no evidence of valvular pathology related to recent surgery. No effusion. LV function, however, EF down to  25% with regional WMAs. Mild MR. While WMAs were present prior to surgery in July, EF was then quantified at 40%. Treated successfully with BIPAP, IV lasix and milrinone 0.25 with lactate downtrending 5.4 ->5 ->2.5, CVP initially > 20. hsTrop 115 not suggestive of recent ischemic event.   CVP 17 today. Now on RA. MV sat 65%. Renal function stable. Milrinone and IV lasix continue. Arlyce Harman and Delene Loll 24/26 started. On heparin.  Will likely need TEE to further evaluate possible flap (versus artifact) in ascending Ao on echo. Pending transfer back to [facility] at patient's and Dr. Gerilyn Pilgrim request.   Overall presentation most likely consistent with post-cardiotomy shock. ? Effect of AF and RV pacing.   Transfer to [facility] in stable condition at Dr. Gerilyn Pilgrim request. Accepting MD: Dr Corine Shelter.   Discharge Vitals: Blood pressure 135/79, pulse 78, temperature 97.9 F (36.6 C), temperature source Oral, resp. rate (!) 21, height 5\' 4"  (1.626 m), weight 66.2 kg, SpO2 92 %.  Labs: Lab Results  Component Value Date   WBC 10.2 11/11/2018   HGB 9.4 (L) 11/11/2018   HCT 29.1 (L) 11/11/2018   MCV 93.3 11/11/2018   PLT 427 (H) 11/11/2018    Recent Labs  Lab 11/10/18 0251 11/11/18 0439  NA 137 137  K 4.1 3.6  CL 99 101  CO2 23 22  BUN 52* 53*  CREATININE 1.33* 1.44*  CALCIUM 9.4 8.7*  PROT 6.5  --   BILITOT 1.0  --   ALKPHOS 122  --   ALT 69*  --   AST 89*  --   GLUCOSE 149* 120*   Lab Results  Component Value Date   CHOL 171 06/14/2018   HDL 57.20 06/14/2018   LDLCALC 100 (H) 06/14/2018   TRIG 69.0 06/14/2018   BNP (last 3 results) Recent Labs    11/09/18 2203  BNP 3,145.6*    ProBNP (last 3 results) No results for input(s): PROBNP in the last 8760 hours.   Diagnostic Studies/Procedures   Dg Chest Port 1 View  Result Date: 11/09/2018 CLINICAL DATA:  Short of breath EXAM: PORTABLE CHEST 1 VIEW COMPARISON:  03/19/2014. FINDINGS: Post sternotomy changes with cutaneous staples and valve prosthesis. Left-sided pacing device is noted. Small right and small moderate left pleural effusion with dense airspace disease at the lingula and left base. Enlarged cardiomediastinal silhouette with vascular congestion and moderate diffuse interstitial opacities suspicious for pulmonary edema. No pneumothorax. IMPRESSION: 1. Cardiomegaly with vascular congestion and moderate diffuse interstitial opacities suspicious for pulmonary edema. There are small right and small moderate left pleural effusions. 2. Dense consolidation at the lingula and left base which may reflect  superimposed atelectasis or pneumonia Electronically Signed   By: Donavan Foil M.D.   On: 11/09/2018 22:14   [place] Ekg Site Rite  Result Date: 11/10/2018 If Site Rite image not attached, placement could not be confirmed due to current cardiac rhythm.   Discharge Medications   Allergies as of 11/11/2018      Reactions   Amoxicillin Swelling      Medication List    STOP taking these medications   clindamycin 300 MG capsule Commonly known as: CLEOCIN   estradiol 2 MG tablet Commonly known as: ESTRACE   fluticasone 50  MCG/ACT nasal spray Commonly known as: FLONASE   furosemide 20 MG tablet Commonly known as: LASIX Replaced by: furosemide 10 MG/ML injection   levocetirizine 5 MG tablet Commonly known as: XYZAL   norethindrone 5 MG tablet Commonly known as: AYGESTIN   oxyCODONE 5 MG immediate release tablet Commonly known as: Oxy IR/ROXICODONE   polyethylene glycol 17 g packet Commonly known as: MIRALAX / GLYCOLAX   SOLUBLE FIBER/PROBIOTICS PO   tretinoin microspheres 0.1 % gel Commonly known as: RETIN-A MICRO     TAKE these medications   aspirin EC 81 MG tablet Take 81 mg by mouth daily.   atorvastatin 40 MG tablet Commonly known as: LIPITOR TAKE 1 TABLET BY MOUTH DAILY   D3-1000 25 MCG (1000 UT) capsule Generic drug: Cholecalciferol Take 2,000 Units by mouth daily.   ferrous sulfate 325 (65 FE) MG tablet Take 325 mg by mouth 2 (two) times daily with a meal.   folic acid 1 MG tablet Commonly known as: FOLVITE Take 1 mg by mouth daily.   furosemide 10 MG/ML injection Commonly known as: LASIX Inject 8 mLs (80 mg total) into the vein 2 (two) times daily. Replaces: furosemide 20 MG tablet   heparin 25000-0.45 UT/250ML-% infusion Inject 900 Units/hr into the vein continuous.   milrinone 20 MG/100 ML Soln infusion Commonly known as: PRIMACOR Inject 0.017 mg/min into the vein continuous.   Potassium Chloride ER 20 MEQ Tbcr Take 20 mEq by mouth 2 (two) times daily.   sacubitril-valsartan 24-26 MG Commonly known as: ENTRESTO Take 1 tablet by mouth 2 (two) times daily.   senna 8.6 MG Tabs tablet Commonly known as: SENOKOT Take 2 tablets by mouth 2 (two) times daily as needed for mild constipation.   spironolactone 25 MG tablet Commonly known as: ALDACTONE Take 0.5 tablets (12.5 mg total) by mouth daily. Start taking on: November 12, 2018       Disposition   The patient will be discharged in stable condition to [facility] with milrinone 0.25 mcg infusing via PICC Discharge  Instructions    (HEART FAILURE PATIENTS) Call MD:  Anytime you have any of the following symptoms: 1) 3 pound weight gain in 24 hours or 5 pounds in 1 week 2) shortness of breath, with or without a dry hacking cough 3) swelling in the hands, feet or stomach 4) if you have to sleep on extra pillows at night in order to breathe.   Complete by: As directed    Diet - low sodium heart healthy   Complete by: As directed    Increase activity slowly   Complete by: As directed          Duration of Discharge Encounter: Greater than 35 minutes   Signed, Glori Bickers MD 11/11/2018, 11:52 AM

## 2018-11-10 NOTE — Plan of Care (Signed)
  Problem: Education: Goal: Knowledge of General Education information will improve Description: Including pain rating scale, medication(s)/side effects and non-pharmacologic comfort measures Outcome: Progressing   Problem: Health Behavior/Discharge Planning: Goal: Ability to manage health-related needs will improve Outcome: Progressing   Problem: Clinical Measurements: Goal: Will remain free from infection Outcome: Progressing   Problem: Activity: Goal: Risk for activity intolerance will decrease Outcome: Progressing   Problem: Pain Managment: Goal: General experience of comfort will improve Outcome: Progressing   Problem: Safety: Goal: Ability to remain free from injury will improve Outcome: Progressing   

## 2018-11-10 NOTE — Progress Notes (Signed)
  Off Bipap and stable on 3 liters [place].   CVP 10. CO-OX 64.5 %.   Continue IV lasix. Stable to move to [facility].   Dr Haroldine Laws updating [place]. Hopefully transfer to [facility] today.   Amy Clegg NP-C  12:42 PM

## 2018-11-11 LAB — CBC
HCT: 29.1 % — ABNORMAL LOW (ref 36.0–46.0)
Hemoglobin: 9.4 g/dL — ABNORMAL LOW (ref 12.0–15.0)
MCH: 30.1 pg (ref 26.0–34.0)
MCHC: 32.3 g/dL (ref 30.0–36.0)
MCV: 93.3 fL (ref 80.0–100.0)
Platelets: 427 10*3/uL — ABNORMAL HIGH (ref 150–400)
RBC: 3.12 MIL/uL — ABNORMAL LOW (ref 3.87–5.11)
RDW: 17.2 % — ABNORMAL HIGH (ref 11.5–15.5)
WBC: 10.2 10*3/uL (ref 4.0–10.5)
nRBC: 0 % (ref 0.0–0.2)

## 2018-11-11 LAB — BASIC METABOLIC PANEL
Anion gap: 14 (ref 5–15)
BUN: 53 mg/dL — ABNORMAL HIGH (ref 8–23)
CO2: 22 mmol/L (ref 22–32)
Calcium: 8.7 mg/dL — ABNORMAL LOW (ref 8.9–10.3)
Chloride: 101 mmol/L (ref 98–111)
Creatinine, Ser: 1.44 mg/dL — ABNORMAL HIGH (ref 0.44–1.00)
GFR calc Af Amer: 42 mL/min — ABNORMAL LOW (ref >60)
GFR calc non Af Amer: 36 mL/min — ABNORMAL LOW (ref >60)
Glucose, Bld: 120 mg/dL — ABNORMAL HIGH (ref 70–99)
Potassium: 3.6 mmol/L (ref 3.5–5.1)
Sodium: 137 mmol/L (ref 135–145)

## 2018-11-11 LAB — MAGNESIUM: Magnesium: 1.9 mg/dL (ref 1.7–2.4)

## 2018-11-11 LAB — HEPARIN LEVEL (UNFRACTIONATED): Heparin Unfractionated: 0.68 IU/mL (ref 0.30–0.70)

## 2018-11-11 MED ORDER — SACUBITRIL-VALSARTAN 24-26 MG PO TABS
1.0000 | ORAL_TABLET | Freq: Two times a day (BID) | ORAL | Status: DC
  Administered 2018-11-11: 12:00:00 1 via ORAL
  Filled 2018-11-11: qty 1

## 2018-11-11 MED ORDER — POTASSIUM CHLORIDE ER 20 MEQ PO TBCR: 20.0000 meq | EXTENDED_RELEASE_TABLET | Freq: Two times a day (BID) | ORAL | Status: DC

## 2018-11-11 MED ORDER — POTASSIUM CHLORIDE ER 10 MEQ PO TBCR
20.0000 meq | EXTENDED_RELEASE_TABLET | Freq: Two times a day (BID) | ORAL | Status: DC
  Administered 2018-11-11: 12:00:00 20 meq via ORAL
  Filled 2018-11-11 (×2): qty 2

## 2018-11-11 MED ORDER — FUROSEMIDE 10 MG/ML IJ SOLN
80.0000 mg | Freq: Two times a day (BID) | INTRAMUSCULAR | Status: DC
  Administered 2018-11-11 (×2): 80 mg via INTRAVENOUS
  Filled 2018-11-11 (×2): qty 8

## 2018-11-11 MED ORDER — SACUBITRIL-VALSARTAN 24-26 MG PO TABS: 1.0000 | ORAL_TABLET | Freq: Two times a day (BID) | ORAL | 0 refills | Status: DC

## 2018-11-11 MED ORDER — MILRINONE LACTATE IN DEXTROSE 20-5 MG/100ML-% IV SOLN: 0.2500 ug/kg/min | INTRAVENOUS | 0 refills | Status: DC

## 2018-11-11 MED ORDER — HEPARIN (PORCINE) 25000 UT/250ML-% IV SOLN: 900.0000 [IU]/h | INTRAVENOUS | 0 refills | Status: DC

## 2018-11-11 MED ORDER — SPIRONOLACTONE 12.5 MG HALF TABLET: 12.5000 mg | ORAL_TABLET | Freq: Every day | ORAL | Status: DC

## 2018-11-11 MED ORDER — SPIRONOLACTONE 12.5 MG HALF TABLET
12.5000 mg | ORAL_TABLET | Freq: Every day | ORAL | Status: DC
  Administered 2018-11-11: 12:00:00 12.5 mg via ORAL
  Filled 2018-11-11: qty 1

## 2018-11-11 MED ORDER — FUROSEMIDE 10 MG/ML IJ SOLN: 80.0000 mg | Freq: Two times a day (BID) | INTRAMUSCULAR | 0 refills | Status: DC

## 2018-11-11 MED ORDER — SPIRONOLACTONE 25 MG PO TABS: 12.5000 mg | ORAL_TABLET | Freq: Every day | ORAL | 0 refills | Status: DC

## 2018-11-11 NOTE — Progress Notes (Signed)
Bipap is a PRN order. 

## 2018-11-11 NOTE — Progress Notes (Signed)
CSW received a call from pt's RN CN who states pt is transferring to [facility] and needs assistance with a hospital to hospital transfer.  TOC CSW consulted TOC social work and was told that RN CM's are medically trained and are able to speak to the RN's at hospitals regarding transfer information..  Pt's RN CN updated and has the # for the [facility] RN CM.  CSW stated CSW was standing by if any social work   Issues arise.  CSW will continue to follow for D/C needs.  Angel Waller. Angel Rone, LCSW, LCAS, CSI Transitions of Care Clinical Social Worker Care Coordination Department Ph: [phone]

## 2018-11-11 NOTE — Progress Notes (Signed)
[place] for Heparin Indication: atrial fibrillation  Allergies  Allergen Reactions  . Amoxicillin Swelling    Patient Measurements: Height: 5\' 4"  (162.6 cm) Weight: 145 lb 15.1 oz (66.2 kg) IBW/kg (Calculated) : 54.7  Vital Signs: Temp: 97.9 F (36.6 C) (08/15 0801) Temp Source: Oral (08/15 0801) BP: 135/79 (08/15 0801) Pulse Rate: 78 (08/15 0801)  Labs: Recent Labs    11/09/18 2202  11/10/18 0001 11/10/18 0015 11/10/18 0251 11/10/18 1016 11/11/18 0439  HGB 10.4*   < > 10.5* 11.6*  --   --  9.4*  HCT 33.9*   < > 34.0* 34.0*  --   --  29.1*  PLT 595*  --  549*  --   --   --  427*  LABPROT  --   --   --   --   --  16.6*  --   INR  --   --   --   --   --  1.4*  --   HEPARINUNFRC  --   --   --   --   --  0.35 0.68  CREATININE 1.36*   < > 1.51*  --  1.33*  --  1.44*  TROPONINIHS 115*  --  124*  --   --   --   --    < > = values in this interval not displayed.    Estimated Creatinine Clearance: 32.6 mL/min (A) (by C-G formula based on SCr of 1.44 mg/dL (H)).   Medical History: Past Medical History:  Diagnosis Date  . Allergy   . Aortic stenosis   . Arthritis    "right knee" (02/29/2012)  . Bicuspid aortic valve 09/2008   STATUS POST BIOPROSTHETIC AORTIC VALVE REPLACEMENT  . Blood transfusion without reported diagnosis   . Complete heart block ([place])   . Heart murmur   . Hyperlipemia   . Migraines    "occasionally" (02/29/2012)  . Osteopenia   . Pacemaker   . S/P AVR (aortic valve replacement)    July 2010  . Seasonal allergies     Medications:  Medications Prior to Admission  Medication Sig Dispense Refill Last Dose  . aspirin EC 81 MG tablet Take 81 mg by mouth daily.   11/09/2018 at unk  . atorvastatin (LIPITOR) 40 MG tablet TAKE 1 TABLET BY MOUTH DAILY (Patient taking differently: Take 40 mg by mouth daily. ) 90 tablet 1 11/09/2018 at Unknown time  . Cholecalciferol (D3-1000) 1000 UNITS capsule Take 2,000 Units by  mouth daily.    11/09/2018 at Unknown time  . clindamycin (CLEOCIN) 300 MG capsule Take 600 mg by mouth daily as needed (two hours before dental appointments).    unk  . estradiol (ESTRACE) 2 MG tablet Take 2 mg by mouth at bedtime.    11/09/2018 at Unknown time  . ferrous sulfate 325 (65 FE) MG tablet Take 325 mg by mouth 2 (two) times daily with a meal.   11/09/2018 at Unknown time  . fluticasone (FLONASE) 50 MCG/ACT nasal spray Place 2 sprays into both nostrils daily. (Patient taking differently: Place 2 sprays into both nostrils daily as needed for allergies. ) 16 g 1 Past Month at Unknown time  . folic acid (FOLVITE) 1 MG tablet Take 1 mg by mouth daily.   11/09/2018 at Unknown time  . furosemide (LASIX) 20 MG tablet Take 20 mg by mouth daily.   11/09/2018 at Unknown time  . levocetirizine (XYZAL) 5 MG tablet  Take 1 tablet (5 mg total) by mouth every evening. (Patient taking differently: Take 5 mg by mouth daily as needed for allergies. ) 30 tablet 3 Past Month at Unknown time  . norethindrone (AYGESTIN) 5 MG tablet Take 5 mg by mouth See admin instructions. Once Every 4 months for 10 days   unk  . oxyCODONE (OXY IR/ROXICODONE) 5 MG immediate release tablet Take 5 mg by mouth every 4 (four) hours as needed for severe pain.   Past Week at Unknown time  . polyethylene glycol (MIRALAX / GLYCOLAX) 17 g packet Take 17 g by mouth daily as needed for mild constipation.   unk  . Probiotic Product (SOLUBLE FIBER/PROBIOTICS PO) Take 1 capsule by mouth at bedtime.    11/09/2018 at Unknown time  . senna (SENOKOT) 8.6 MG TABS tablet Take 2 tablets by mouth 2 (two) times daily as needed for mild constipation.   unk  . tretinoin microspheres (RETIN-A MICRO) 0.1 % gel Apply 1 application topically at bedtime.    11/09/2018 at Unknown time    Assessment: 73 y.o. female with CHF and new onset Afib s/p Bentall procedure 7/28. Pharmacy has been consulted for heparin dosing.  Heparin level is therapeutic at 0.68 but  increased from previous - drawn correctly from CL and heparin infusing peripherally at 1000 units/hr. No bleeding cbc ok    Goal of Therapy:  Heparin level 0.3-0.7 units/ml Monitor platelets by anticoagulation protocol: Yes   Plan:  decrease heparin IV to 900 units/hr May transfer to [place] later today Will check heparin level in AM if still admitted to [place].D. CPP, BCPS Clinical Pharmacist [phone] 11/11/2018 10:58 AM

## 2018-11-11 NOTE — Progress Notes (Signed)
Advanced Heart Failure Rounding Note   Subjective:    Patient admitted 8/14with severe respiratory distress in setting of worsening HF and cardiogenic shock. Initial lactate 5.4. CXR with diffuse pulmonary edema. Started on BiPAP and milrinone. Echo EF 20-25% (previosuly 40-45%) with septal dysynchrony RV ok AVR stable no AI. ? Small flap in Asc Ao  Now on [place]. Breathing much better. No orthopnea or PND. Remains on milrinone 0.25. MV sat 65% Diuresed well on IV lasix. Weight down 4 pounds since admit. CVP 16 Creatinine stable 1.3-1.5 range.    Objective:   Weight Range:  Vital Signs:   Temp:  [97.8 F (36.6 C)-98.2 F (36.8 C)] 97.9 F (36.6 C) (08/15 0801) Pulse Rate:  [70-78] 78 (08/15 0801) Resp:  [15-29] 21 (08/15 0400) BP: (106-154)/(60-79) 135/79 (08/15 0801) SpO2:  [93 %-100 %] 94 % (08/15 0801) FiO2 (%):  [30 %-60 %] 30 % (08/14 1058) Weight:  [66.2 kg] 66.2 kg (08/15 0400) Last BM Date: 11/10/18  Weight change: Filed Weights   11/09/18 2306 11/10/18 0100 11/11/18 0400  Weight: 68 kg 69.1 kg 66.2 kg    Intake/Output:   Intake/Output Summary (Last 24 hours) at 11/11/2018 0949 Last data filed at 11/11/2018 0900 Gross per 24 hour  Intake 1168.39 ml  Output 1175 ml  Net -6.61 ml     Physical Exam: General:  Sitting up in chair No resp difficulty HEENT: normal Neck: supple.JVP to ear . Carotids 2+ bilat; no bruits. No lymphadenopathy or thryomegaly appreciated. Cor: Chest wound with  staples ok PMI nondisplaced. Regular rate & rhythm. +s3. Lungs: crackles in bases  Abdomen: soft, nontender, nondistended. No hepatosplenomegaly. No bruits or masses. Good bowel sounds. Extremities: no cyanosis, clubbing, rash, 3+ edema RUE PICC Neuro: alert & orientedx3, cranial nerves grossly intact. moves all 4 extremities w/o difficulty. Affect pleasant   Telemetry: V pacing  80s - underlying rhythm appears sinus Personally reviewed   Labs: Basic Metabolic Panel: Recent  Labs  Lab 11/09/18 2202 11/09/18 2211 11/09/18 2212 11/10/18 0001 11/10/18 0015 11/10/18 0251 11/11/18 0439  NA 136 135 134*  --  138 137 137  K 4.4 4.4 4.3  --  4.3 4.1 3.6  CL 102  --  106  --   --  99 101  CO2 16*  --   --   --   --  23 22  GLUCOSE 213*  --  211*  --   --  149* 120*  BUN 48*  --  44*  --   --  52* 53*  CREATININE 1.36*  --  1.20* 1.51*  --  1.33* 1.44*  CALCIUM 9.4  --   --   --   --  9.4 8.7*  MG  --   --   --   --   --   --  1.9    Liver Function Tests: Recent Labs  Lab 11/09/18 2202 11/10/18 0251  AST 67* 89*  ALT 56* 69*  ALKPHOS 128* 122  BILITOT 1.1 1.0  PROT 6.7 6.5  ALBUMIN 2.7* 2.7*   No results for input(s): LIPASE, AMYLASE in the last 168 hours. No results for input(s): AMMONIA in the last 168 hours.  CBC: Recent Labs  Lab 11/09/18 2202 11/09/18 2211 11/09/18 2212 11/10/18 0001 11/10/18 0015 11/11/18 0439  WBC 13.5*  --   --  13.6*  --  10.2  NEUTROABS 10.5*  --   --   --   --   --  HGB 10.4* 11.6* 10.9* 10.5* 11.6* 9.4*  HCT 33.9* 34.0* 32.0* 34.0* 34.0* 29.1*  MCV 95.5  --   --  95.0  --  93.3  PLT 595*  --   --  549*  --  427*    Cardiac Enzymes: No results for input(s): CKTOTAL, CKMB, CKMBINDEX, TROPONINI in the last 168 hours.  BNP: BNP (last 3 results) Recent Labs    11/09/18 2203  BNP 3,145.6*    ProBNP (last 3 results) No results for input(s): PROBNP in the last 8760 hours.    Other results:  Imaging: Dg Chest Port 1 View  Result Date: 11/09/2018 CLINICAL DATA:  Short of breath EXAM: PORTABLE CHEST 1 VIEW COMPARISON:  03/19/2014. FINDINGS: Post sternotomy changes with cutaneous staples and valve prosthesis. Left-sided pacing device is noted. Small right and small moderate left pleural effusion with dense airspace disease at the lingula and left base. Enlarged cardiomediastinal silhouette with vascular congestion and moderate diffuse interstitial opacities suspicious for pulmonary edema. No pneumothorax.  IMPRESSION: 1. Cardiomegaly with vascular congestion and moderate diffuse interstitial opacities suspicious for pulmonary edema. There are small right and small moderate left pleural effusions. 2. Dense consolidation at the lingula and left base which may reflect superimposed atelectasis or pneumonia Electronically Signed   By: Donavan Foil M.D.   On: 11/09/2018 22:14   [place] Ekg Site Rite  Result Date: 11/10/2018 If Site Rite image not attached, placement could not be confirmed due to current cardiac rhythm.    Medications:     Scheduled Medications: . aspirin EC  81 mg Oral Daily  . chlorhexidine  15 mL Mouth Rinse BID  . Chlorhexidine Gluconate Cloth  6 each Topical Daily  . mouth rinse  15 mL Mouth Rinse q12n4p  . sodium chloride flush  10-40 mL Intracatheter Q12H  . sodium chloride flush  3 mL Intravenous Q12H    Infusions: . sodium chloride Stopped (11/10/18 0243)  . famotidine (PEPCID) IV 20 mg (11/11/18 0612)  . heparin 1,000 Units/hr (11/11/18 0800)  . milrinone 0.25 mcg/kg/min (11/11/18 0800)    PRN Medications: sodium chloride, acetaminophen, ondansetron (ZOFRAN) IV, sodium chloride flush, sodium chloride flush   Assessment/Plan:   1. Cardiogenic shock 2. Acute on chronic systolic HF     - EF 48-54% -> 20-25% 3. Acute hypoxic respiratory failure 4. H/o bicuspid AoV (s/p previous AVR c/b perivalvular leak)    - now 2 weeks out from re-do AVR Bentall at [facility] 5. PAF  6. H/o HB s/p PPM  7. hypokalemia  Echo shows markedly reduced LV function which is down from previous. She presented with low output HF/shock and acidosis. Now improve with milrinone, BIPAP and diuresis. Etiology of worsening LV dysfunction unclear but doubt ischemic event in setting of normal hstrop. ? AF, post-cardiotomy or RV pacing  MV sat 65%. Acidosis resolved. CVP still elevated (16-18). Renal function stable.  Continue IV diuresis Supp K. Add spiro and low-dose Entresto.   Continue heparin.    Will likely need TEE to further evaluate possible flap in ascending Ao on echo. Pending transfer back to [facility] at patient's and Dr. Gerilyn Pilgrim request.   Suspect she will need CRT upgrade at some point.    Length of Stay: 2   Glori Bickers MD 11/11/2018, 9:49 AM  Advanced Heart Failure Team Pager [phone] (M-F; 7a - 4p)  Please contact [facility] for night-coverage after hours (4p -7a ) and weekends on [email or web address]

## 2018-11-11 NOTE — Progress Notes (Signed)
RN called report to Marisue Ivan RN [address], [facility] at 1pm.

## 2018-11-11 NOTE — Progress Notes (Signed)
Patient assessed personally prior to d/c and remains stable.

## 2018-11-13 ENCOUNTER — Telehealth: Payer: Self-pay

## 2018-11-13 NOTE — Telephone Encounter (Signed)
NOTES ON FILE FROM [place] ED, SENT REFERRAL TO SCHEDULING

## 2018-12-20 ENCOUNTER — Ambulatory Visit (INDEPENDENT_AMBULATORY_CARE_PROVIDER_SITE_OTHER): Payer: Medicare Other | Admitting: Family Medicine

## 2018-12-20 ENCOUNTER — Ambulatory Visit: Payer: Medicare Other

## 2018-12-20 ENCOUNTER — Other Ambulatory Visit: Payer: Self-pay

## 2018-12-20 ENCOUNTER — Encounter: Payer: Self-pay | Admitting: Family Medicine

## 2018-12-20 VITALS — BP 119/81 | HR 79 | Temp 97.9°F | Resp 16 | Ht 64.0 in | Wt 125.0 lb

## 2018-12-20 DIAGNOSIS — I5021 Acute systolic (congestive) heart failure: Secondary | ICD-10-CM

## 2018-12-20 DIAGNOSIS — Z23 Encounter for immunization: Secondary | ICD-10-CM

## 2018-12-20 DIAGNOSIS — Z4802 Encounter for removal of sutures: Secondary | ICD-10-CM

## 2018-12-20 DIAGNOSIS — E785 Hyperlipidemia, unspecified: Secondary | ICD-10-CM | POA: Diagnosis not present

## 2018-12-20 DIAGNOSIS — Z0000 Encounter for general adult medical examination without abnormal findings: Secondary | ICD-10-CM | POA: Diagnosis not present

## 2018-12-20 LAB — BASIC METABOLIC PANEL
BUN: 25 mg/dL — ABNORMAL HIGH (ref 6–23)
CO2: 28 mEq/L (ref 19–32)
Calcium: 9.8 mg/dL (ref 8.4–10.5)
Chloride: 96 mEq/L (ref 96–112)
Creatinine, Ser: 0.71 mg/dL (ref 0.40–1.20)
GFR: 80.5 mL/min (ref >60.00)
Glucose, Bld: 99 mg/dL (ref 70–99)
Potassium: 4 mEq/L (ref 3.5–5.1)
Sodium: 134 mEq/L — ABNORMAL LOW (ref 135–145)

## 2018-12-20 LAB — HEPATIC FUNCTION PANEL
ALT: 11 U/L (ref 0–35)
AST: 19 U/L (ref 0–37)
Albumin: 3.7 g/dL (ref 3.5–5.2)
Alkaline Phosphatase: 62 U/L (ref 39–117)
Bilirubin, Direct: 0.1 mg/dL (ref 0.0–0.3)
Total Bilirubin: 0.5 mg/dL (ref 0.2–1.2)
Total Protein: 7.1 g/dL (ref 6.0–8.3)

## 2018-12-20 LAB — CBC WITH DIFFERENTIAL/PLATELET
Basophils Absolute: 0 10*3/uL (ref 0.0–0.1)
Basophils Relative: 0.7 % (ref 0.0–3.0)
Eosinophils Absolute: 0.2 10*3/uL (ref 0.0–0.7)
Eosinophils Relative: 3.1 % (ref 0.0–5.0)
HCT: 36.4 % (ref 36.0–46.0)
Hemoglobin: 12.2 g/dL (ref 12.0–15.0)
Lymphocytes Relative: 29.2 % (ref 12.0–46.0)
Lymphs Abs: 1.8 10*3/uL (ref 0.7–4.0)
MCHC: 33.7 g/dL (ref 30.0–36.0)
MCV: 89.1 fl (ref 78.0–100.0)
Monocytes Absolute: 0.6 10*3/uL (ref 0.1–1.0)
Monocytes Relative: 10 % (ref 3.0–12.0)
Neutro Abs: 3.5 10*3/uL (ref 1.4–7.7)
Neutrophils Relative %: 57 % (ref 43.0–77.0)
Platelets: 211 10*3/uL (ref 150.0–400.0)
RBC: 4.08 Mil/uL (ref 3.87–5.11)
RDW: 16.4 % — ABNORMAL HIGH (ref 11.5–15.5)
WBC: 6.2 10*3/uL (ref 4.0–10.5)

## 2018-12-20 LAB — TSH: TSH: 2.09 u[IU]/mL (ref 0.35–4.50)

## 2018-12-20 LAB — LIPID PANEL
Cholesterol: 195 mg/dL (ref 0–200)
HDL: 41.4 mg/dL (ref >39.00)
LDL Cholesterol: 118 mg/dL — ABNORMAL HIGH (ref 0–99)
NonHDL: 153.73
Total CHOL/HDL Ratio: 5
Triglycerides: 178 mg/dL — ABNORMAL HIGH (ref 0.0–149.0)
VLDL: 35.6 mg/dL (ref 0.0–40.0)

## 2018-12-20 NOTE — Assessment & Plan Note (Signed)
Currently asymptomatic.  Following w/ cardiology both at [place].

## 2018-12-20 NOTE — Patient Instructions (Signed)
Follow up in 6 months to recheck cholesterol We'll notify you of your lab results and make any changes if needed Keep up the good work!  You look great! Call with any questions or concerns Stay Safe!

## 2018-12-20 NOTE — Assessment & Plan Note (Signed)
Chronic problem.  Tolerating statin w/o difficulty.  Check labs.  Adjust meds prn  

## 2018-12-20 NOTE — Progress Notes (Signed)
   Subjective:    Patient ID: Angel Waller, female    DOB: May 15, 1945, 73 y.o.   MRN: [id number]  HPI CPE- UTD on pneumonia vaccines, Tdap, will get flu today.  UTD on colonoscopy.  mammo scheduled.  Needs sternal staple removal today  Here today for MWV.  Risk Factors: CHF- chronic problem, following w/ Cardiology.  On Coreg 3.125mg  BID, Losartan 25mg  daily, Torsemide 10mg  daily Hyperlipidemia- chronic problem, on Lipitor 40mg  daily,  Physical Activity: walking 2x/day Fall Risk: low Depression: denies Hearing: normal to conversational tones, mildly decreased to whispered voice ADL's: independent Cognitive: normal linear thought process, memory and attention intact Home Safety: safe at home, lives w/ husband Height, Weight, BMI, Visual Acuity: see vitals, vision corrected to 20/20 w/ glasses Counseling: UTD on colonoscopy, pneumonia vaccines, Tdap.  Will get flu shot today.  Mammo is scheduled. Labs Ordered: See A&P Care Plan: See A&P   Patient Care Team    Relationship Specialty Notifications Start End  Midge Minium, MD PCP - General Family Medicine  08/03/13   Thompson Grayer, MD Consulting Physician Cardiology  07/01/14   Inda Castle, MD (Inactive) Consulting Physician Gastroenterology  07/01/14   Pleasant, Robley Fries., MD Referring Physician Obstetrics and Gynecology  07/03/15   Mackie Pai, MD Referring Physician Cardiology  11/17/16   Martinique, Amy, MD Consulting Physician Dermatology  11/17/16   Paralee Cancel, MD Consulting Physician Orthopedic Surgery  12/14/17       Review of Systems Patient reports no vision/ hearing changes, adenopathy,fever, weight change,  persistant/recurrent hoarseness , swallowing issues, chest pain, palpitations, edema, persistant/recurrent cough, hemoptysis, dyspnea (rest/exertional/paroxysmal nocturnal), gastrointestinal bleeding (melena, rectal bleeding), abdominal pain, significant heartburn, bowel changes, GU symptoms  (dysuria, hematuria, incontinence), Gyn symptoms (abnormal  bleeding, pain),  syncope, focal weakness, memory loss, numbness & tingling, skin/hair/nail changes, abnormal bruising or bleeding, anxiety, or depression.     Objective:   Physical Exam General Appearance:    Alert, cooperative, no distress, appears stated age  Head:    Normocephalic, without obvious abnormality, atraumatic  Eyes:    PERRL, conjunctiva/corneas clear, EOM's intact, fundi    benign, both eyes  Ears:    Normal TM's and external ear canals, both ears  Nose:   Deferred due to COVID  Throat:   Neck:   Supple, symmetrical, trachea midline, no adenopathy;    Thyroid: no enlargement/tenderness/nodules  Back:     Symmetric, no curvature, ROM normal, no CVA tenderness  Lungs:     Clear to auscultation bilaterally, respirations unlabored  Chest Wall:    Sternal wound w/ staples that need to be removed- removed w/o difficulty   Heart:    Regular rate and rhythm, S1 and S2 normal, + artificial valve click  Breast Exam:    Deferred to GYN  Abdomen:     Soft, non-tender, bowel sounds active all four quadrants,    no masses, no organomegaly  Genitalia:    Deferred to GYN  Rectal:    Extremities:   Extremities normal, atraumatic, no cyanosis or edema  Pulses:   2+ and symmetric all extremities  Skin:   Skin color, texture, turgor normal, no rashes or lesions  Lymph nodes:   Cervical, supraclavicular, and axillary nodes normal  Neurologic:   CNII-XII intact, normal strength, sensation and reflexes    throughout          Assessment & Plan:

## 2018-12-20 NOTE — Assessment & Plan Note (Signed)
Pt's PE WNL w/ exception of sternal incision that required staple removal today.  Pt tolerated removal w/o difficulty and is pleased she doesn't have to drive to [facility] tomorrow.  UTD on pneumonia vaccines, flu shot given today.  UTD on colonoscopy, mammo is scheduled.  Check labs.  Anticipatory guidance provided.

## 2019-01-11 ENCOUNTER — Ambulatory Visit (INDEPENDENT_AMBULATORY_CARE_PROVIDER_SITE_OTHER): Payer: Medicare Other | Admitting: *Deleted

## 2019-01-11 ENCOUNTER — Telehealth: Payer: Self-pay

## 2019-01-11 ENCOUNTER — Telehealth: Payer: Self-pay | Admitting: Internal Medicine

## 2019-01-11 DIAGNOSIS — I5021 Acute systolic (congestive) heart failure: Secondary | ICD-10-CM | POA: Diagnosis not present

## 2019-01-11 DIAGNOSIS — I442 Atrioventricular block, complete: Secondary | ICD-10-CM

## 2019-01-11 NOTE — Telephone Encounter (Signed)
NOTES ON FILE FROM [facility] , REFERRAL SENT TO SCHEDULING

## 2019-01-11 NOTE — Telephone Encounter (Signed)
Per notes in CE, Dr. Michaelle Birks advised patient to discuss discontinuing Eliquis with Dr. Rayann Heman if no further AF since hospitalization in August. PPM transmission received. 1.1% AT/AF burden since last interrogation on 11/14/18. No further AF episodes noted since 10/2018.  Spoke with patient, advised of results. Explained that I will forward information to Dr. Rayann Heman for review and recommendations regarding Eliquis. Patient has about 5 days left of her current bottle, reports she has refills on file at pharmacy. Advised to continue Eliquis until instructed otherwise. Patient verbalizes understanding and will await recommendations from Dr. Rayann Heman.  Routed to Dr. Rayann Heman and Sonia Baller, RN.

## 2019-01-11 NOTE — Telephone Encounter (Signed)
New message   Notes were received from Dr. Raphael Gibney office asking for pt to have appt with Dr. Rayann Heman to have pacer interogated to see if any PAF so that perhaps Eliquis can be stopped as the fib was post-op issue. Device RN said pt could send a transmission. Informed pt of this and asked her to send a transmission.

## 2019-01-13 LAB — CUP PACEART REMOTE DEVICE CHECK
Battery Impedance: 851 Ohm
Battery Remaining Longevity: 63 mo
Battery Voltage: 2.78 V
Brady Statistic AP VP Percent: 11 %
Brady Statistic AP VS Percent: 0 %
Brady Statistic AS VP Percent: 89 %
Brady Statistic AS VS Percent: 0 %
Date Time Interrogation Session: 20201015135220
Implantable Lead Implant Date: 20131205
Implantable Lead Implant Date: 20131205
Implantable Lead Location: 753859
Implantable Lead Location: 753860
Implantable Lead Model: 5076
Implantable Lead Model: 5092
Implantable Pulse Generator Implant Date: 20131205
Lead Channel Impedance Value: 357 Ohm
Lead Channel Impedance Value: 657 Ohm
Lead Channel Pacing Threshold Amplitude: 0.625 V
Lead Channel Pacing Threshold Amplitude: 0.75 V
Lead Channel Pacing Threshold Pulse Width: 0.4 ms
Lead Channel Pacing Threshold Pulse Width: 0.4 ms
Lead Channel Setting Pacing Amplitude: 2 V
Lead Channel Setting Pacing Amplitude: 2.5 V
Lead Channel Setting Pacing Pulse Width: 0.4 ms
Lead Channel Setting Sensing Sensitivity: 2.8 mV

## 2019-01-21 NOTE — Telephone Encounter (Signed)
OK to stop eliquis and restart if we see additional afib in the future.

## 2019-01-22 NOTE — Addendum Note (Signed)
Addended by: Willeen Cass A on: 01/22/2019 08:46 AM   Modules accepted: Orders

## 2019-01-22 NOTE — Telephone Encounter (Signed)
Pt notified via My Chart

## 2019-01-26 NOTE — Progress Notes (Signed)
Remote pacemaker transmission.   

## 2019-02-14 LAB — HM MAMMOGRAPHY: HM Mammogram: NORMAL (ref 0–4)

## 2019-03-07 ENCOUNTER — Other Ambulatory Visit: Payer: Self-pay

## 2019-03-07 DIAGNOSIS — Z20822 Contact with and (suspected) exposure to covid-19: Secondary | ICD-10-CM

## 2019-03-08 LAB — NOVEL CORONAVIRUS, NAA: SARS-CoV-2, NAA: DETECTED — AB

## 2019-03-10 ENCOUNTER — Telehealth: Payer: Self-pay | Admitting: Unknown Physician Specialty

## 2019-03-10 NOTE — Telephone Encounter (Signed)
Discussed with patient about Covid symptoms and the use of bamlanivimab, a monoclonal antibody infusion for those with mild to moderate Covid symptoms and at a high risk of hospitalization.  Pt is qualified for this infusion at the [facility] infusion center due to Age > 22   Pt is better and will be ill for greater than 10 days at the time of the treatment.

## 2019-04-12 ENCOUNTER — Ambulatory Visit (INDEPENDENT_AMBULATORY_CARE_PROVIDER_SITE_OTHER): Payer: Medicare PPO | Admitting: *Deleted

## 2019-04-12 DIAGNOSIS — I442 Atrioventricular block, complete: Secondary | ICD-10-CM

## 2019-04-12 LAB — CUP PACEART REMOTE DEVICE CHECK
Battery Impedance: 877 Ohm
Battery Remaining Longevity: 59 mo
Battery Voltage: 2.79 V
Brady Statistic AP VP Percent: 40 %
Brady Statistic AP VS Percent: 0 %
Brady Statistic AS VP Percent: 59 %
Brady Statistic AS VS Percent: 0 %
Date Time Interrogation Session: 20210114090118
Implantable Lead Implant Date: 20131205
Implantable Lead Implant Date: 20131205
Implantable Lead Location: 753859
Implantable Lead Location: 753860
Implantable Lead Model: 5076
Implantable Lead Model: 5092
Implantable Pulse Generator Implant Date: 20131205
Lead Channel Impedance Value: 365 Ohm
Lead Channel Impedance Value: 622 Ohm
Lead Channel Pacing Threshold Amplitude: 0.5 V
Lead Channel Pacing Threshold Amplitude: 0.625 V
Lead Channel Pacing Threshold Pulse Width: 0.4 ms
Lead Channel Pacing Threshold Pulse Width: 0.4 ms
Lead Channel Setting Pacing Amplitude: 2 V
Lead Channel Setting Pacing Amplitude: 2.5 V
Lead Channel Setting Pacing Pulse Width: 0.4 ms
Lead Channel Setting Sensing Sensitivity: 2.8 mV

## 2019-04-13 NOTE — Progress Notes (Signed)
PPM remote 

## 2019-05-14 DIAGNOSIS — H353131 Nonexudative age-related macular degeneration, bilateral, early dry stage: Secondary | ICD-10-CM | POA: Diagnosis not present

## 2019-05-14 DIAGNOSIS — H02834 Dermatochalasis of left upper eyelid: Secondary | ICD-10-CM | POA: Diagnosis not present

## 2019-05-14 DIAGNOSIS — H02831 Dermatochalasis of right upper eyelid: Secondary | ICD-10-CM | POA: Diagnosis not present

## 2019-06-19 ENCOUNTER — Encounter: Payer: Self-pay | Admitting: Family Medicine

## 2019-06-19 ENCOUNTER — Ambulatory Visit: Payer: Medicare PPO | Admitting: Family Medicine

## 2019-06-19 ENCOUNTER — Other Ambulatory Visit: Payer: Self-pay

## 2019-06-19 VITALS — BP 123/83 | HR 79 | Temp 98.1°F | Resp 16 | Ht 64.0 in | Wt 136.1 lb

## 2019-06-19 DIAGNOSIS — R635 Abnormal weight gain: Secondary | ICD-10-CM | POA: Diagnosis not present

## 2019-06-19 DIAGNOSIS — Z8616 Personal history of COVID-19: Secondary | ICD-10-CM

## 2019-06-19 DIAGNOSIS — E785 Hyperlipidemia, unspecified: Secondary | ICD-10-CM

## 2019-06-19 DIAGNOSIS — I5022 Chronic systolic (congestive) heart failure: Secondary | ICD-10-CM | POA: Diagnosis not present

## 2019-06-19 LAB — HEPATIC FUNCTION PANEL
ALT: 11 U/L (ref 0–35)
AST: 20 U/L (ref 0–37)
Albumin: 3.6 g/dL (ref 3.5–5.2)
Alkaline Phosphatase: 53 U/L (ref 39–117)
Bilirubin, Direct: 0.1 mg/dL (ref 0.0–0.3)
Total Bilirubin: 0.4 mg/dL (ref 0.2–1.2)
Total Protein: 6.8 g/dL (ref 6.0–8.3)

## 2019-06-19 LAB — LIPID PANEL
Cholesterol: 152 mg/dL (ref 0–200)
HDL: 52.9 mg/dL (ref >39.00)
LDL Cholesterol: 77 mg/dL (ref 0–99)
NonHDL: 99.21
Total CHOL/HDL Ratio: 3
Triglycerides: 111 mg/dL (ref 0.0–149.0)
VLDL: 22.2 mg/dL (ref 0.0–40.0)

## 2019-06-19 LAB — CBC WITH DIFFERENTIAL/PLATELET
Basophils Absolute: 0 10*3/uL (ref 0.0–0.1)
Basophils Relative: 0.4 % (ref 0.0–3.0)
Eosinophils Absolute: 0.3 10*3/uL (ref 0.0–0.7)
Eosinophils Relative: 4.3 % (ref 0.0–5.0)
HCT: 35.4 % — ABNORMAL LOW (ref 36.0–46.0)
Hemoglobin: 12.1 g/dL (ref 12.0–15.0)
Lymphocytes Relative: 26.3 % (ref 12.0–46.0)
Lymphs Abs: 1.7 10*3/uL (ref 0.7–4.0)
MCHC: 34.1 g/dL (ref 30.0–36.0)
MCV: 92 fl (ref 78.0–100.0)
Monocytes Absolute: 0.4 10*3/uL (ref 0.1–1.0)
Monocytes Relative: 6.4 % (ref 3.0–12.0)
Neutro Abs: 4.1 10*3/uL (ref 1.4–7.7)
Neutrophils Relative %: 62.6 % (ref 43.0–77.0)
Platelets: 169 10*3/uL (ref 150.0–400.0)
RBC: 3.85 Mil/uL — ABNORMAL LOW (ref 3.87–5.11)
RDW: 13.2 % (ref 11.5–15.5)
WBC: 6.6 10*3/uL (ref 4.0–10.5)

## 2019-06-19 LAB — BASIC METABOLIC PANEL
BUN: 25 mg/dL — ABNORMAL HIGH (ref 6–23)
CO2: 28 mEq/L (ref 19–32)
Calcium: 9.1 mg/dL (ref 8.4–10.5)
Chloride: 104 mEq/L (ref 96–112)
Creatinine, Ser: 0.81 mg/dL (ref 0.40–1.20)
GFR: 69.05 mL/min (ref >60.00)
Glucose, Bld: 92 mg/dL (ref 70–99)
Potassium: 4.2 mEq/L (ref 3.5–5.1)
Sodium: 137 mEq/L (ref 135–145)

## 2019-06-19 LAB — TSH: TSH: 1.9 u[IU]/mL (ref 0.35–4.50)

## 2019-06-19 NOTE — Progress Notes (Signed)
   Subjective:    Patient ID: Angel Waller, female    DOB: 08/19/1945, 74 y.o.   MRN: [id number]  HPI CHF- chronic problem, on Losartan 25mg  daily, Coreg 3.125mg  BID,  Demadex 10mg  daily w/ good control.  Follows w/ cards regularly.  No CP, SOB, HAs, edema.  Hyperlipidemia- chronic problem, on Lipitor 40mg  daily.  Denies abd pain, N/V  Weight gain- pt has gained 10 lbs since Sept.  Reports Angel Waller is not exercising regularly.  COVID- has recovered w/o any known residual effects   Review of Systems For ROS see HPI   This visit occurred during the SARS-CoV-2 public health emergency.  Safety protocols were in place, including screening questions prior to the visit, additional usage of staff PPE, and extensive cleaning of exam room while observing appropriate contact time as indicated for disinfecting solutions.       Objective:   Physical Exam Vitals reviewed.  Constitutional:      General: Angel Waller is not in acute distress.    Appearance: Normal appearance. Angel Waller is well-developed.  HENT:     Head: Normocephalic and atraumatic.  Eyes:     Conjunctiva/sclera: Conjunctivae normal.     Pupils: Pupils are equal, round, and reactive to light.  Neck:     Thyroid: No thyromegaly.  Cardiovascular:     Rate and Rhythm: Normal rate and regular rhythm.     Pulses: Normal pulses.     Heart sounds: Murmur present.  Pulmonary:     Effort: Pulmonary effort is normal. No respiratory distress.     Breath sounds: Normal breath sounds.  Abdominal:     General: There is no distension.     Palpations: Abdomen is soft.     Tenderness: There is no abdominal tenderness.  Musculoskeletal:     Cervical back: Normal range of motion and neck supple.     Right lower leg: No edema.     Left lower leg: No edema.  Lymphadenopathy:     Cervical: No cervical adenopathy.  Skin:    General: Skin is warm and dry.  Neurological:     Mental Status: Angel Waller is alert and oriented to person, place, and time.   Psychiatric:        Mood and Affect: Mood normal.        Behavior: Behavior normal.        Thought Content: Thought content normal.           Assessment & Plan:  Weight gain- pt has gained 10 lbs since September.  Stressed need for healthy diet and regular exercise- pt admits Angel Waller hasn't been doing either.  Will continue to follow.

## 2019-06-19 NOTE — Assessment & Plan Note (Signed)
Chronic problem.  Following w/ cards.  Currently asymptomatic.  I will continue to follow along and assist as able

## 2019-06-19 NOTE — Assessment & Plan Note (Signed)
Pt has seemingly recovered w/o any residual effects.

## 2019-06-19 NOTE — Patient Instructions (Addendum)
Schedule your complete physical and wellness visit in 6 months We'll notify you of your lab results and make any changes if needed Continue to work on healthy diet and regular exercise- you can do it! Call with any questions or concerns Stay Safe!  Stay Healthy!

## 2019-06-19 NOTE — Assessment & Plan Note (Signed)
Chronic problem.  Tolerating statin w/o difficulty.  Encouraged healthy diet and regular exercise.  Check labs.  Adjust meds prn. 

## 2019-06-20 ENCOUNTER — Encounter: Payer: Self-pay | Admitting: General Practice

## 2019-07-04 DIAGNOSIS — Z952 Presence of prosthetic heart valve: Secondary | ICD-10-CM | POA: Diagnosis not present

## 2019-07-04 DIAGNOSIS — I480 Paroxysmal atrial fibrillation: Secondary | ICD-10-CM | POA: Diagnosis not present

## 2019-07-04 DIAGNOSIS — Z8679 Personal history of other diseases of the circulatory system: Secondary | ICD-10-CM | POA: Diagnosis not present

## 2019-07-04 DIAGNOSIS — Z950 Presence of cardiac pacemaker: Secondary | ICD-10-CM | POA: Diagnosis not present

## 2019-07-12 ENCOUNTER — Ambulatory Visit (INDEPENDENT_AMBULATORY_CARE_PROVIDER_SITE_OTHER): Payer: Medicare PPO | Admitting: *Deleted

## 2019-07-12 DIAGNOSIS — I442 Atrioventricular block, complete: Secondary | ICD-10-CM

## 2019-07-12 LAB — CUP PACEART REMOTE DEVICE CHECK
Date Time Interrogation Session: 20210415094715
Implantable Lead Implant Date: 20131205
Implantable Lead Implant Date: 20131205
Implantable Lead Location: 753859
Implantable Lead Location: 753860
Implantable Lead Model: 5076
Implantable Lead Model: 5092
Implantable Pulse Generator Implant Date: 20131205

## 2019-07-13 NOTE — Progress Notes (Signed)
PPM Remote  

## 2019-07-19 ENCOUNTER — Encounter: Payer: Self-pay | Admitting: Internal Medicine

## 2019-07-19 ENCOUNTER — Ambulatory Visit: Payer: Medicare PPO | Admitting: Internal Medicine

## 2019-07-19 ENCOUNTER — Other Ambulatory Visit: Payer: Self-pay

## 2019-07-19 VITALS — BP 126/68 | HR 70 | Ht 64.0 in | Wt 135.0 lb

## 2019-07-19 DIAGNOSIS — Z952 Presence of prosthetic heart valve: Secondary | ICD-10-CM

## 2019-07-19 DIAGNOSIS — I442 Atrioventricular block, complete: Secondary | ICD-10-CM

## 2019-07-19 DIAGNOSIS — Z950 Presence of cardiac pacemaker: Secondary | ICD-10-CM

## 2019-07-19 DIAGNOSIS — I5022 Chronic systolic (congestive) heart failure: Secondary | ICD-10-CM

## 2019-07-19 DIAGNOSIS — I4729 Other ventricular tachycardia: Secondary | ICD-10-CM | POA: Insufficient documentation

## 2019-07-19 DIAGNOSIS — I472 Ventricular tachycardia: Secondary | ICD-10-CM | POA: Diagnosis not present

## 2019-07-19 NOTE — Patient Instructions (Addendum)
Medication Instructions:  Your physician recommends that you continue on your current medications as directed. Please refer to the Current Medication list given to you today.  Labwork: None ordered.  Testing/Procedures: None ordered.  Follow-Up: Your physician wants you to follow-up in: one year with Dr. Rayann Heman.   July 07, 2020 at 2:00 pm  Remote monitoring is used to monitor your Pacemaker from home. This monitoring reduces the number of office visits required to check your device to one time per year. It allows [place] to keep an eye on the functioning of your device to ensure it is working properly. You are scheduled for a device check from home on 10/11/2019. You may send your transmission at any time that day. If you have a wireless device, the transmission will be sent automatically. After your physician reviews your transmission, you will receive a postcard with your next transmission date.  Any Other Special Instructions Will Be Listed Below (If Applicable).  If you need a refill on your cardiac medications before your next appointment, please call your pharmacy.

## 2019-07-19 NOTE — Progress Notes (Signed)
PCP: Midge Minium, MD Primary Cardiologist: Dr Michaelle Birks ([facility]) Primary EP:  Dr Rayann Heman  Angel Waller is a 74 y.o. female who presents today for routine electrophysiology followup.  She underwent [place] procedure this year.  She had brief post operative afib, but none since.  She is returning to baseline.  Energy is slowly improving.   Today, she denies symptoms of palpitations, chest pain, shortness of breath,  lower extremity edema, dizziness, presyncope, or syncope.  The patient is otherwise without complaint today.   Past Medical History:  Diagnosis Date  . Allergy   . Aortic stenosis   . Arthritis    "right knee" (02/29/2012)  . Bicuspid aortic valve 09/2008   STATUS POST BIOPROSTHETIC AORTIC VALVE REPLACEMENT  . Blood transfusion without reported diagnosis   . Complete heart block ([place])   . Heart murmur   . Hyperlipemia   . Migraines    "occasionally" (02/29/2012)  . Osteopenia   . Pacemaker   . S/P AVR (aortic valve replacement)    July 2010  . Seasonal allergies    Past Surgical History:  Procedure Laterality Date  . CARDIAC VALVE REPLACEMENT  2010   tissue aortic valve; [facility]  . COLONOSCOPY  2005   negative; [place], [place]  . INSERT / REPLACE / REMOVE PACEMAKER    . KNEE ARTHROSCOPY Right   . PERMANENT PACEMAKER INSERTION  03/02/2012   Medtronic Adapta L implanted by Dr Rayann Heman  . PERMANENT PACEMAKER INSERTION N/A 03/02/2012   Procedure: PERMANENT PACEMAKER INSERTION;  Surgeon: Thompson Grayer, MD;  Location: [facility] C[facility] LAB;  Service: Cardiovascular;  Laterality: N/A;  . POLYPECTOMY    . TEMPORARY PACEMAKER INSERTION N/A 03/01/2012   Procedure: TEMPORARY PACEMAKER INSERTION;  Surgeon: Burnell Blanks, MD;  Location: [facility] CATH LAB;  Service: Cardiovascular;  Laterality: N/A;  . [place]  . TOTAL KNEE ARTHROPLASTY Right 01/07/2014   Procedure: RIGHT TOTAL KNEE ARTHROPLASTY;  Surgeon: Mauri Pole, MD;  Location: [facility] ORS;  Service:  Orthopedics;  Laterality: Right;    ROS- all systems are reviewed and negative except as per HPI above  Current Outpatient Medications  Medication Sig Dispense Refill  . aspirin 81 MG EC tablet Take 1 tablet by mouth daily.    Marland Kitchen atorvastatin (LIPITOR) 40 MG tablet TAKE 1 TABLET BY MOUTH DAILY 90 tablet 1  . carvedilol (COREG) 3.125 MG tablet Take 3.125 mg by mouth 2 (two) times daily with a meal.     . Cholecalciferol (D3-1000) 1000 UNITS capsule Take 2,000 Units by mouth daily.     Marland Kitchen estradiol (ESTRACE) 2 MG tablet Take 2 mg by mouth daily.     Marland Kitchen losartan (COZAAR) 25 MG tablet Take 25 mg by mouth daily.     . norethindrone (AYGESTIN) 5 MG tablet     . omega-3 acid ethyl esters (LOVAZA) 1 g capsule     . senna (SENOKOT) 8.6 MG TABS tablet Take 2 tablets by mouth 2 (two) times daily as needed for mild constipation.    . torsemide (DEMADEX) 10 MG tablet Take 10 mg by mouth daily.     Marland Kitchen tretinoin microspheres (RETIN-A MICRO) 0.1 % gel Apply at bedtime as directed     No current facility-administered medications for this visit.    Physical Exam: Vitals:   07/19/19 1612  BP: 126/68  Pulse: 70  SpO2: 99%  Weight: 135 lb (61.2 kg)  Height: 5\' 4"  (1.626 m)  GEN- The patient is well appearing, alert and oriented x 3 today.   Head- normocephalic, atraumatic Eyes-  Sclera clear, conjunctiva pink Ears- hearing intact Oropharynx- clear Lungs-  normal work of breathing Chest- pacemaker pocket is well healed Heart- Regular rate and rhythm  GI- soft  Extremities- no clubbing, cyanosis, or edema  Pacemaker interrogation- reviewed in detail today,  See PACEART report  ekg tracing ordered today is personally reviewed and shows AV paced rhythm at 70 bpm  Assessment and Plan:  1. Symptomatic  complete heart block Normal pacemaker function See Pace Art report No changes today she is device dependant today Could consider upgrade to CRT in the future if her EF does not normalize  (previously 45%)  2. Post operative afib No recurrences by device interrogation I agree with Dr Michaelle Birks that we should avoid anticoagulation unless her afib returns  3. NSVT Asymptomatic Continue to follow  4. Nonischemic CM NYHA Class I currently As above She will likely have repeat echo no follow-up with [facility]  Return to see me in a year  Thompson Grayer MD, [facility] 07/19/2019 4:30 PM

## 2019-07-25 DIAGNOSIS — D225 Melanocytic nevi of trunk: Secondary | ICD-10-CM | POA: Diagnosis not present

## 2019-07-25 DIAGNOSIS — D1801 Hemangioma of skin and subcutaneous tissue: Secondary | ICD-10-CM | POA: Diagnosis not present

## 2019-07-25 DIAGNOSIS — L821 Other seborrheic keratosis: Secondary | ICD-10-CM | POA: Diagnosis not present

## 2019-08-20 DIAGNOSIS — H25013 Cortical age-related cataract, bilateral: Secondary | ICD-10-CM | POA: Diagnosis not present

## 2019-08-20 DIAGNOSIS — H02831 Dermatochalasis of right upper eyelid: Secondary | ICD-10-CM | POA: Diagnosis not present

## 2019-08-20 DIAGNOSIS — H353131 Nonexudative age-related macular degeneration, bilateral, early dry stage: Secondary | ICD-10-CM | POA: Diagnosis not present

## 2019-08-20 DIAGNOSIS — H02834 Dermatochalasis of left upper eyelid: Secondary | ICD-10-CM | POA: Diagnosis not present

## 2019-08-22 DIAGNOSIS — N950 Postmenopausal bleeding: Secondary | ICD-10-CM | POA: Diagnosis not present

## 2019-08-29 DIAGNOSIS — R9389 Abnormal findings on diagnostic imaging of other specified body structures: Secondary | ICD-10-CM | POA: Diagnosis not present

## 2019-08-29 DIAGNOSIS — N950 Postmenopausal bleeding: Secondary | ICD-10-CM | POA: Diagnosis not present

## 2019-08-29 DIAGNOSIS — D259 Leiomyoma of uterus, unspecified: Secondary | ICD-10-CM | POA: Diagnosis not present

## 2019-08-31 DIAGNOSIS — N8501 Benign endometrial hyperplasia: Secondary | ICD-10-CM | POA: Diagnosis not present

## 2019-09-08 IMAGING — DX PORTABLE CHEST - 1 VIEW
1 series · 1 of 1 positions shown · non-contrast
Comparison: 03/19/2014.

CLINICAL DATA: Short of breath

EXAM:
PORTABLE CHEST 1 VIEW

[chest ap]
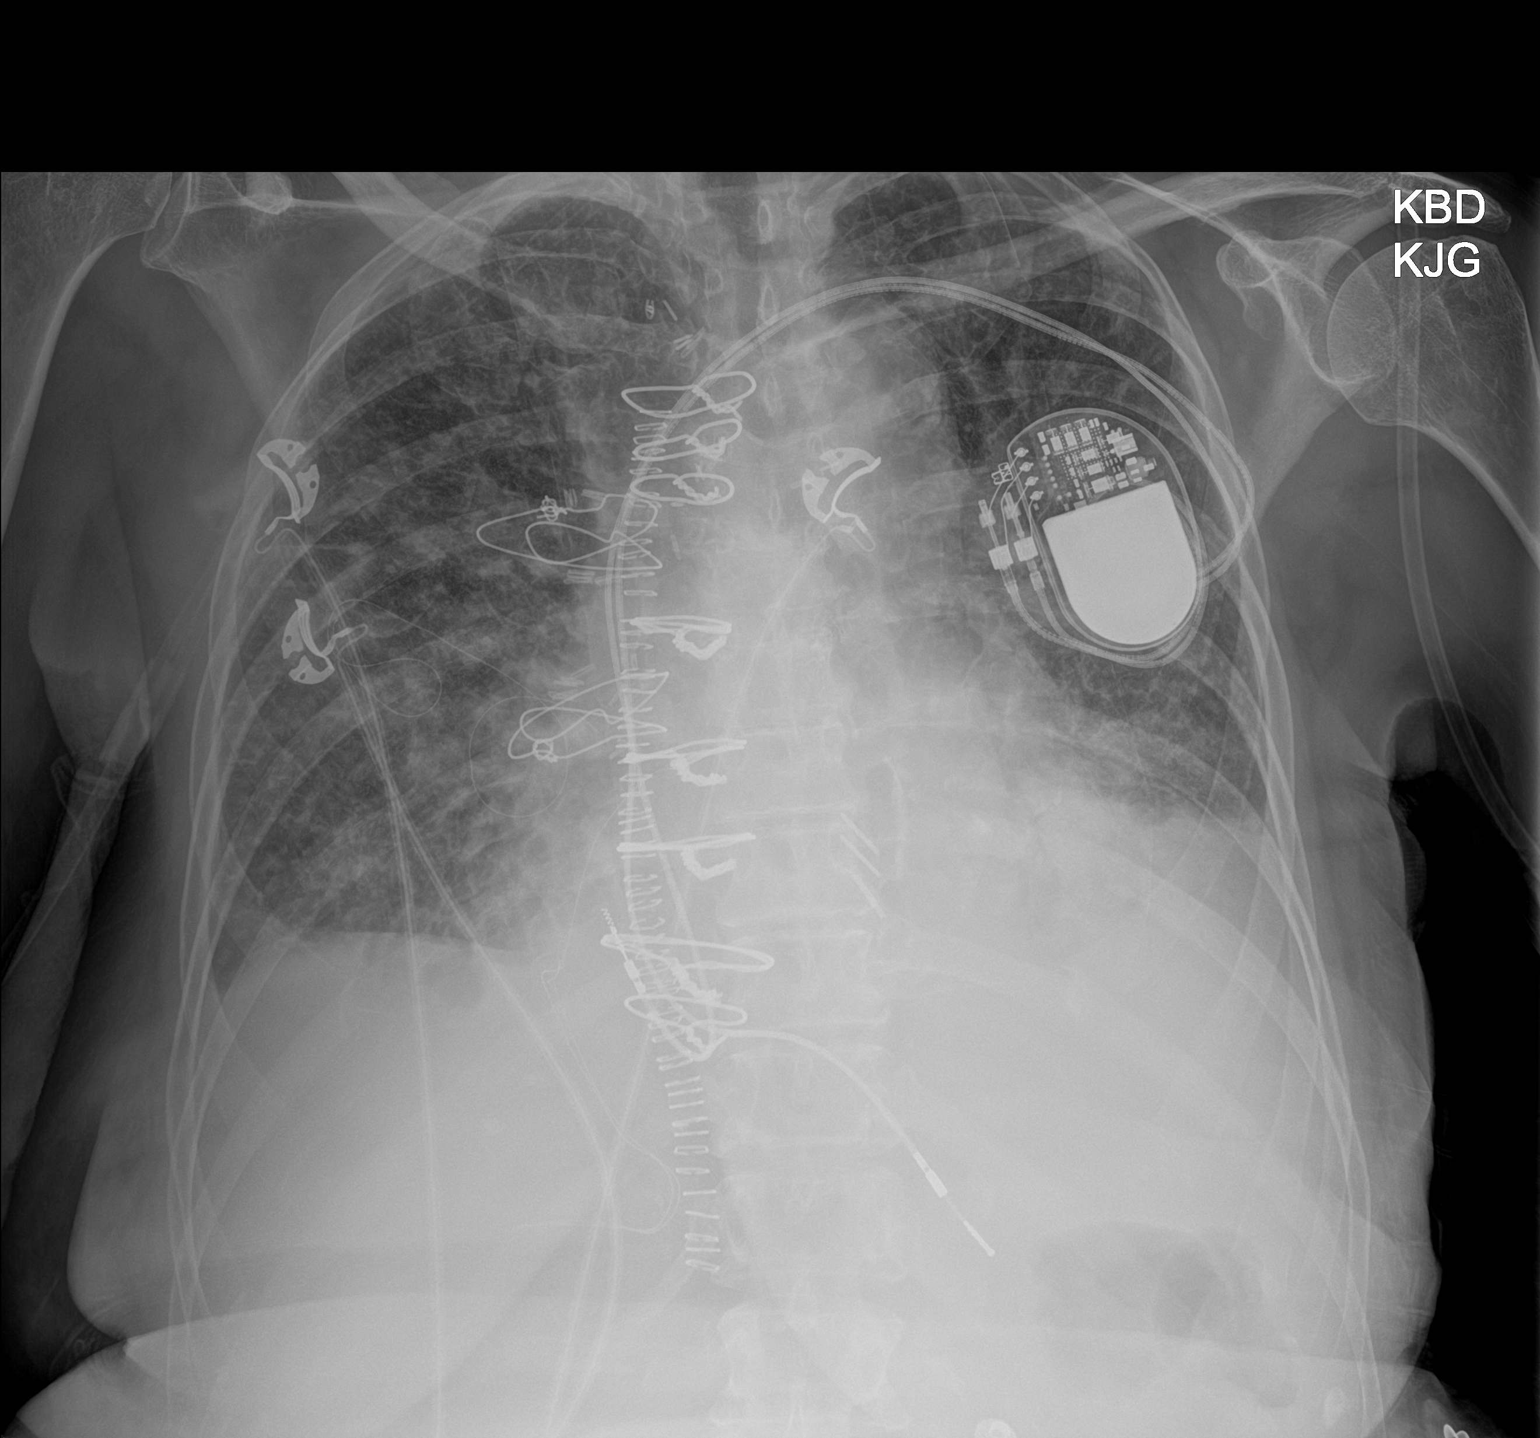

[1 of 1 positions shown; findings below may reference images not displayed]

FINDINGS: Post sternotomy changes with cutaneous staples and valve prosthesis.
Left-sided pacing device is noted. Small right and small moderate
left pleural effusion with dense airspace disease at the lingula and
left base. Enlarged cardiomediastinal silhouette with vascular
congestion and moderate diffuse interstitial opacities suspicious
for pulmonary edema. No pneumothorax.
IMPRESSION: 1. Cardiomegaly with vascular congestion and moderate diffuse
interstitial opacities suspicious for pulmonary edema. There are
small right and small moderate left pleural effusions.
2. Dense consolidation at the lingula and left base which may
reflect superimposed atelectasis or pneumonia

## 2019-09-14 ENCOUNTER — Telehealth: Payer: Self-pay | Admitting: Family Medicine

## 2019-09-14 NOTE — Telephone Encounter (Signed)
LVM that 12/26/19 appt for wellness visit and Dr Birdie Riddle appt has been resched to 12/25/19 due to the Health nurse schedule change.  Please call to verify this change.

## 2019-09-28 DIAGNOSIS — N8500 Endometrial hyperplasia, unspecified: Secondary | ICD-10-CM | POA: Diagnosis not present

## 2019-09-28 DIAGNOSIS — N950 Postmenopausal bleeding: Secondary | ICD-10-CM | POA: Diagnosis not present

## 2019-09-28 DIAGNOSIS — Z880 Allergy status to penicillin: Secondary | ICD-10-CM | POA: Diagnosis not present

## 2019-09-28 DIAGNOSIS — Z87891 Personal history of nicotine dependence: Secondary | ICD-10-CM | POA: Diagnosis not present

## 2019-09-28 DIAGNOSIS — Z950 Presence of cardiac pacemaker: Secondary | ICD-10-CM | POA: Diagnosis not present

## 2019-09-28 DIAGNOSIS — Z953 Presence of xenogenic heart valve: Secondary | ICD-10-CM | POA: Diagnosis not present

## 2019-09-28 DIAGNOSIS — N8501 Benign endometrial hyperplasia: Secondary | ICD-10-CM | POA: Diagnosis not present

## 2019-10-11 ENCOUNTER — Ambulatory Visit (INDEPENDENT_AMBULATORY_CARE_PROVIDER_SITE_OTHER): Payer: Medicare PPO | Admitting: *Deleted

## 2019-10-11 DIAGNOSIS — I4729 Other ventricular tachycardia: Secondary | ICD-10-CM

## 2019-10-11 DIAGNOSIS — I472 Ventricular tachycardia: Secondary | ICD-10-CM | POA: Diagnosis not present

## 2019-10-11 DIAGNOSIS — I442 Atrioventricular block, complete: Secondary | ICD-10-CM

## 2019-10-11 LAB — CUP PACEART REMOTE DEVICE CHECK
Battery Impedance: 1058 Ohm
Battery Remaining Longevity: 55 mo
Battery Voltage: 2.78 V
Brady Statistic AP VP Percent: 38 %
Brady Statistic AP VS Percent: 0 %
Brady Statistic AS VP Percent: 61 %
Brady Statistic AS VS Percent: 0 %
Date Time Interrogation Session: 20210715090219
Implantable Lead Implant Date: 20131205
Implantable Lead Implant Date: 20131205
Implantable Lead Location: 753859
Implantable Lead Location: 753860
Implantable Lead Model: 5076
Implantable Lead Model: 5092
Implantable Pulse Generator Implant Date: 20131205
Lead Channel Impedance Value: 365 Ohm
Lead Channel Impedance Value: 608 Ohm
Lead Channel Pacing Threshold Amplitude: 0.625 V
Lead Channel Pacing Threshold Amplitude: 0.75 V
Lead Channel Pacing Threshold Pulse Width: 0.4 ms
Lead Channel Pacing Threshold Pulse Width: 0.4 ms
Lead Channel Setting Pacing Amplitude: 2 V
Lead Channel Setting Pacing Amplitude: 2.5 V
Lead Channel Setting Pacing Pulse Width: 0.4 ms
Lead Channel Setting Sensing Sensitivity: 2.8 mV

## 2019-10-11 NOTE — Progress Notes (Signed)
Remote pacemaker transmission.   

## 2019-10-16 DIAGNOSIS — Z9889 Other specified postprocedural states: Secondary | ICD-10-CM | POA: Diagnosis not present

## 2019-10-16 DIAGNOSIS — N8500 Endometrial hyperplasia, unspecified: Secondary | ICD-10-CM | POA: Diagnosis not present

## 2019-10-22 DIAGNOSIS — Z01818 Encounter for other preprocedural examination: Secondary | ICD-10-CM | POA: Diagnosis not present

## 2019-11-04 DIAGNOSIS — N8500 Endometrial hyperplasia, unspecified: Secondary | ICD-10-CM | POA: Diagnosis not present

## 2019-11-04 DIAGNOSIS — Z01812 Encounter for preprocedural laboratory examination: Secondary | ICD-10-CM | POA: Diagnosis not present

## 2019-11-04 DIAGNOSIS — Z20822 Contact with and (suspected) exposure to covid-19: Secondary | ICD-10-CM | POA: Diagnosis not present

## 2019-11-07 DIAGNOSIS — E785 Hyperlipidemia, unspecified: Secondary | ICD-10-CM | POA: Diagnosis not present

## 2019-11-07 DIAGNOSIS — Z79899 Other long term (current) drug therapy: Secondary | ICD-10-CM | POA: Diagnosis not present

## 2019-11-07 DIAGNOSIS — Z87891 Personal history of nicotine dependence: Secondary | ICD-10-CM | POA: Diagnosis not present

## 2019-11-07 DIAGNOSIS — N838 Other noninflammatory disorders of ovary, fallopian tube and broad ligament: Secondary | ICD-10-CM | POA: Diagnosis not present

## 2019-11-07 DIAGNOSIS — Z7982 Long term (current) use of aspirin: Secondary | ICD-10-CM | POA: Diagnosis not present

## 2019-11-07 DIAGNOSIS — I509 Heart failure, unspecified: Secondary | ICD-10-CM | POA: Diagnosis not present

## 2019-11-07 DIAGNOSIS — N8501 Benign endometrial hyperplasia: Secondary | ICD-10-CM | POA: Diagnosis not present

## 2019-11-08 DIAGNOSIS — Z87891 Personal history of nicotine dependence: Secondary | ICD-10-CM | POA: Diagnosis not present

## 2019-11-08 DIAGNOSIS — E785 Hyperlipidemia, unspecified: Secondary | ICD-10-CM | POA: Diagnosis not present

## 2019-11-08 DIAGNOSIS — N838 Other noninflammatory disorders of ovary, fallopian tube and broad ligament: Secondary | ICD-10-CM | POA: Diagnosis not present

## 2019-11-08 DIAGNOSIS — I509 Heart failure, unspecified: Secondary | ICD-10-CM | POA: Diagnosis not present

## 2019-11-08 DIAGNOSIS — Z79899 Other long term (current) drug therapy: Secondary | ICD-10-CM | POA: Diagnosis not present

## 2019-11-08 DIAGNOSIS — N8501 Benign endometrial hyperplasia: Secondary | ICD-10-CM | POA: Diagnosis not present

## 2019-11-08 DIAGNOSIS — Z7982 Long term (current) use of aspirin: Secondary | ICD-10-CM | POA: Diagnosis not present

## 2019-11-21 DIAGNOSIS — Z9889 Other specified postprocedural states: Secondary | ICD-10-CM | POA: Diagnosis not present

## 2019-12-17 ENCOUNTER — Ambulatory Visit: Payer: Medicare PPO

## 2019-12-21 NOTE — Progress Notes (Signed)
Subjective:   Angel Waller is a 74 y.o. female who presents for Medicare Annual (Subsequent) preventive examination.  I connected with Adalyna today by telephone and verified that I am speaking with the correct person using two identifiers. Location patient: home Location provider: work Persons participating in the virtual visit: patient, Marine scientist.    I discussed the limitations, risks, security and privacy concerns of performing an evaluation and management service by telephone and the availability of in person appointments. I also discussed with the patient that there may be a patient responsible charge related to this service. The patient expressed understanding and verbally consented to this telephonic visit.    Interactive audio and video telecommunications were attempted between this provider and patient, however failed, due to patient having technical difficulties OR patient did not have access to video capability.  We continued and completed visit with audio only.  Some vital signs may be absent or patient reported.   Time Spent with patient on telephone encounter: 25 minutes  Review of Systems     Cardiac Risk Factors include: advanced age (>85men, >66 women);dyslipidemia     Objective:    Today's Vitals   12/24/19 1244  Weight: 135 lb (61.2 kg)  Height: 5\' 4"  (1.626 m)   Body mass index is 23.17 kg/m.  Advanced Directives 12/24/2019 11/10/2018 11/09/2018 12/14/2017 04/17/2017 11/17/2016 03/18/2014  Does Patient Have a Medical Advance Directive? Yes Yes No Yes No Yes No  Type of Paramedic of [place];Living will [place];Living will - Living will;Healthcare Power of [place];Living will -  Does patient want to make changes to medical advance directive? - No - Patient declined - - - - -  Copy of [place] in Chart? No - copy requested No - copy requested - No - copy requested - No -  copy requested -  Would patient like information on creating a medical advance directive? - No - Patient declined - - No - Patient declined - -  Pre-existing out of facility DNR order (yellow form or pink MOST form) - - - - - - -    Current Medications (verified) Outpatient Encounter Medications as of 12/24/2019  Medication Sig   aspirin 81 MG EC tablet Take 1 tablet by mouth daily.   atorvastatin (LIPITOR) 40 MG tablet TAKE 1 TABLET BY MOUTH DAILY   carvedilol (COREG) 3.125 MG tablet Take 3.125 mg by mouth 2 (two) times daily with a meal.    Cholecalciferol (D3-1000) 1000 UNITS capsule Take 2,000 Units by mouth daily.    clindamycin (CLEOCIN) 300 MG capsule    estradiol (ESTRACE) 2 MG tablet Take 2 mg by mouth daily.    losartan (COZAAR) 25 MG tablet Take 25 mg by mouth daily.    omega-3 acid ethyl esters (LOVAZA) 1 g capsule    senna (SENOKOT) 8.6 MG TABS tablet Take 2 tablets by mouth 2 (two) times daily as needed for mild constipation.   torsemide (DEMADEX) 10 MG tablet Take 10 mg by mouth daily.    tretinoin microspheres (RETIN-A MICRO) 0.1 % gel Apply at bedtime as directed   norethindrone (AYGESTIN) 5 MG tablet  (Patient not taking: Reported on 12/24/2019)   No facility-administered encounter medications on file as of 12/24/2019.    Allergies (verified) Amoxicillin   History: Past Medical History:  Diagnosis Date   Allergy    Aortic stenosis    Arthritis    "right  knee" (02/29/2012)   Bicuspid aortic valve 09/2008   STATUS POST BIOPROSTHETIC AORTIC VALVE REPLACEMENT   Blood transfusion without reported diagnosis    Complete heart block ([place])    Heart murmur    Hyperlipemia    Migraines    "occasionally" (02/29/2012)   Osteopenia    Pacemaker    S/P AVR (aortic valve replacement)    July 2010   Seasonal allergies    Past Surgical History:  Procedure Laterality Date   ABDOMINAL HYSTERECTOMY     CARDIAC VALVE REPLACEMENT  2010   tissue  aortic valve; [place]   COLONOSCOPY  2005   negative; [place], [place]   INSERT / REPLACE / REMOVE PACEMAKER     KNEE ARTHROSCOPY Right    PERMANENT PACEMAKER INSERTION  03/02/2012   Medtronic Adapta L implanted by Dr Rayann Heman   PERMANENT PACEMAKER INSERTION N/A 03/02/2012   Procedure: PERMANENT PACEMAKER INSERTION;  Surgeon: Thompson Grayer, MD;  Location: [facility] CATH LAB;  Service: Cardiovascular;  Laterality: N/A;   POLYPECTOMY     TEMPORARY PACEMAKER INSERTION N/A 03/01/2012   Procedure: TEMPORARY PACEMAKER INSERTION;  Surgeon: Burnell Blanks, MD;  Location: [facility] CATH LAB;  Service: Cardiovascular;  Laterality: N/A;   TONSILLECTOMY AND ADENOIDECTOMY  1962   TOTAL KNEE ARTHROPLASTY Right 01/07/2014   Procedure: RIGHT TOTAL KNEE ARTHROPLASTY;  Surgeon: Mauri Pole, MD;  Location: [facility];  Service: Orthopedics;  Laterality: Right;   Family History  Problem Relation Age of Onset   Heart attack Mother 44       CABG   Heart attack Father 71       aneurysm post MI   Colon polyps Father    Heart disease Other        in both M & P uncles & aunts; no premature MI   Diabetes Neg Hx    Stroke Neg Hx    Colon cancer Neg Hx    Pancreatic cancer Neg Hx    Rectal cancer Neg Hx    Stomach cancer Neg Hx    Social History   Socioeconomic History   Marital status: Married    Spouse name: Not on file   Number of children: Not on file   Years of education: Not on file   Highest education level: Not on file  Occupational History   Occupation: retired  Tobacco Use   Smoking status: Former Smoker    Packs/day: 0.50    Years: 42.00    Pack years: 21.00    Types: Cigarettes    Quit date: 03/30/2007    Years since quitting: 12.7   Smokeless tobacco: Never Used   Tobacco comment: smoked ages [age], up to 10 pp WEEK  Vaping Use   Vaping Use: Never used  Substance and Sexual Activity   Alcohol use: No   Drug use: No   Sexual activity: Yes  Other Topics Concern    Not on file  Social History Narrative   Not on file   Social Determinants of Health   Financial Resource Strain: Low Risk    Difficulty of Paying Living Expenses: Not hard at all  Food Insecurity: No Food Insecurity   Worried About Charity fundraiser in the Last Year: Never true   Ran Out of Food in the Last Year: Never true  Transportation Needs: No Transportation Needs   Lack of Transportation (Medical): No   Lack of Transportation (Non-Medical): No  Physical Activity: Inactive   Days of Exercise  per Week: 0 days   Minutes of Exercise per Session: 0 min  Stress: No Stress Concern Present   Feeling of Stress : Not at all  Social Connections: Socially Integrated   Frequency of Communication with Friends and Family: More than three times a week   Frequency of Social Gatherings with Friends and Family: Once a week   Attends Religious Services: More than 4 times per year   Active Member of [employer] or Organizations: Yes   Attends Music therapist: More than 4 times per year   Marital Status: Married    Tobacco Counseling Counseling given: Not Answered Comment: smoked ages [age], up to 1 pp WEEK   Clinical Intake:  Pre-visit preparation completed: Yes  Pain : No/denies pain     Nutritional Status: BMI of 19-24  Normal Nutritional Risks: None Diabetes: No  How often do you need to have someone help you when you read instructions, pamphlets, or other written materials from your doctor or pharmacy?: 1 - Never What is the last grade level you completed in school?: 4 years of college  Diabetic?No  Interpreter Needed?: No  Information entered by :: Caroleen Hamman LPN   Activities of Daily Living In your present state of health, do you have any difficulty performing the following activities: 12/24/2019 06/19/2019  Hearing? N N  Vision? N N  Difficulty concentrating or making decisions? N N  Walking or climbing stairs? N N  Dressing or bathing?  N N  Doing errands, shopping? N N  Preparing Food and eating ? N -  Using the Toilet? N -  In the past six months, have you accidently leaked urine? N -  Do you have problems with loss of bowel control? N -  Managing your Medications? N -  Managing your Finances? N -  Housekeeping or managing your Housekeeping? N -  Some recent data might be hidden    Patient Care Team: Midge Minium, MD as PCP - General (Family Medicine) Thompson Grayer, MD as Consulting Physician (Cardiology) Inda Castle, MD (Inactive) as Consulting Physician (Gastroenterology) Pleasant, Robley Fries., MD as Referring Physician (Obstetrics and Gynecology) Mackie Pai, MD as Referring Physician (Cardiology) Martinique, Amy, MD as Consulting Physician (Dermatology) Paralee Cancel, MD as Consulting Physician (Orthopedic Surgery)  Indicate any recent Medical Services you may have received from other than [facility] providers in the past year (date may be approximate).     Assessment:   This is a routine wellness examination for [place].  Hearing/Vision screen  Hearing Screening   125Hz  250Hz  500Hz  1000Hz  2000Hz  3000Hz  4000Hz  6000Hz  8000Hz   Right ear:           Left ear:           Comments: No issues  Vision Screening Comments: Wears glasses- Last eye exam-05/2019-Dr. Sharen Counter  Dietary issues and exercise activities discussed: Current Exercise Habits: The patient does not participate in regular exercise at present (Due to recent surgery), Exercise limited by: Other - see comments (surgery)  Goals     patient      Maintain current health by staying active.       Depression Screen PHQ 2/9 Scores 12/24/2019 06/19/2019 12/20/2018 06/14/2018 12/14/2017 05/18/2017 11/17/2016  PHQ - 2 Score 0 0 0 0 0 0 0  PHQ- 9 Score - 0 0 - - 0 -    Fall Risk Fall Risk  12/24/2019 06/19/2019 12/20/2018 06/14/2018 12/14/2017  Falls in the past year? 0 0 0  0 No  Number falls in past yr: 0 0 0 0 -  Injury with Fall? 0 0 0 0 -    Follow up Falls prevention discussed Falls evaluation completed - - -    Any stairs in or around the home? Yes  If so, are there any without handrails? No  Home free of loose throw rugs in walkways, pet beds, electrical cords, etc? Yes  Adequate lighting in your home to reduce risk of falls? Yes   ASSISTIVE DEVICES UTILIZED TO PREVENT FALLS:  Life alert? No  Use of a cane, walker or w/c? No  Grab bars in the bathroom? Yes  Shower chair or bench in shower? No  Elevated toilet seat or a handicapped toilet? No   TIMED UP AND GO:  Was the test performed? No . Phone visit   Cognitive Function:No cognitive impairment noted. MMSE - Mini Mental State Exam 12/14/2017  Orientation to time 5  Orientation to Place 5  Registration 3  Attention/ Calculation 5  Recall 3  Language- name 2 objects 2  Language- repeat 1  Language- follow 3 step command 3  Language- read & follow direction 1  Write a sentence 1  Copy design 1  Total score 30        Immunizations Immunization History  Administered Date(s) Administered   Fluad Quad(high Dose 65+) 12/20/2018   Influenza Split 03/02/2011   Influenza Whole 02/24/2010   Influenza, High Dose Seasonal PF 12/14/2017   Influenza,inj,Quad PF,6+ Mos 02/26/2014, 12/31/2014, 01/06/2016, 11/17/2016   Influenza-Unspecified 01/12/2012, 12/27/2012   PFIZER SARS-COV-2 Vaccination 05/21/2019, 06/11/2019   Pneumococcal Conjugate-13 07/01/2014   Pneumococcal Polysaccharide-23 03/03/2012   Tdap 02/29/2012   Varicella 07/18/2013   Zoster Recombinat (Shingrix) 12/14/2016, 06/22/2017    TDAP status: Up to date   Flu Vaccine status: Declined, Education has been provided regarding the importance of this vaccine but patient still declined. Advised may receive this vaccine at local pharmacy or Health Dept. Aware to provide a copy of the vaccination record if obtained from local pharmacy or Health Dept. Verbalized acceptance and understanding.  Phone visit  Pneumococcal vaccine status: Up to date   Covid-19 vaccine status: Completed vaccines  Qualifies for Shingles Vaccine? No   Zostavax completed No   Shingrix Completed?: Yes  Screening Tests Health Maintenance  Topic Date Due   INFLUENZA VACCINE  10/28/2019   MAMMOGRAM  02/14/2020   TETANUS/TDAP  02/28/2022   COLONOSCOPY  05/04/2023   DEXA SCAN  Completed   COVID-19 Vaccine  Completed   Hepatitis C Screening  Completed   PNA vac Low Risk Adult  Completed    Health Maintenance  Health Maintenance Due  Topic Date Due   INFLUENZA VACCINE  10/28/2019    Colorectal cancer screening: Completed 05/03/2018. Repeat every 5 years   Mammogram status: Completed 02/14/2019. Repeat every year   Bone Density Status: Declined at this time.  Lung Cancer Screening: (Low Dose CT Chest recommended if Age 34-80 years, 30 pack-year currently smoking OR have quit w/in 15years.) does not qualify.     Additional Screening:  Hepatitis C Screening:  Completed 11/17/2016  Vision Screening: Recommended annual ophthalmology exams for early detection of glaucoma and other disorders of the eye. Is the patient up to date with their annual eye exam?  Yes  Who is the provider or what is the name of the office in which the patient attends annual eye exams? Dr. Sharen Counter   Dental Screening: Recommended annual dental exams for  proper oral hygiene  Community Resource Referral / Chronic Care Management: CRR required this visit?  No   CCM required this visit?  No      Plan:     I have personally reviewed and noted the following in the patients chart:    Medical and social history  Use of alcohol, tobacco or illicit drugs   Current medications and supplements  Functional ability and status  Nutritional status  Physical activity  Advanced directives  List of other physicians  Hospitalizations, surgeries, and ER visits in previous 12 months  Vitals  Screenings  to include cognitive, depression, and falls  Referrals and appointments  In addition, I have reviewed and discussed with patient certain preventive protocols, quality metrics, and best practice recommendations. A written personalized care plan for preventive services as well as general preventive health recommendations were provided to patient.  Due to this being a telephonic visit, the after visit summary with patients personalized plan was offered to patient via mail or my-chart.  Patient would like to access on my-chart.     Marta Antu, LPN   [id number]  Nurse health Advisor  Nurse Notes: None

## 2019-12-24 ENCOUNTER — Ambulatory Visit (INDEPENDENT_AMBULATORY_CARE_PROVIDER_SITE_OTHER): Payer: Medicare PPO

## 2019-12-24 VITALS — Ht 64.0 in | Wt 135.0 lb

## 2019-12-24 DIAGNOSIS — Z0000 Encounter for general adult medical examination without abnormal findings: Secondary | ICD-10-CM

## 2019-12-24 NOTE — Patient Instructions (Signed)
Angel Waller , Thank you for taking time to complete your Medicare Wellness Visit. I appreciate your ongoing commitment to your health goals. Please review the following plan we discussed and let me know if I can assist you in the future.   Screening recommendations/referrals: Colonoscopy:Completed 05/03/2018- No longer indicated after age 75 Mammogram: Completed 02/14/2019-Due-02/14/2020 Bone Density: Completed 07/22/2011- Declined at this time. Recommended yearly ophthalmology/optometry visit for glaucoma screening and checkup Recommended yearly dental visit for hygiene and checkup  Vaccinations: Influenza vaccine: Due- May obtain vaccine at our office or your local pharmacy. Pneumococcal vaccine: Completed vaccines Tdap vaccine: Up to Date-Due-02/29/2012 Shingles vaccine: Completed vaccines   Covid-19:Completed vaccines  Advanced directives: Please bring a copy for your chart  Conditions/risks identified: See problem list  Next appointment: Follow up in one year for your annual wellness visit 12/29/2020 @ 1:30.   Preventive Care 75 Years and Older, Female Preventive care refers to lifestyle choices and visits with your health care provider that can promote health and wellness. What does preventive care include?  A yearly physical exam. This is also called an annual well check.  Dental exams once or twice a year.  Routine eye exams. Ask your health care provider how often you should have your eyes checked.  Personal lifestyle choices, including:  Daily care of your teeth and gums.  Regular physical activity.  Eating a healthy diet.  Avoiding tobacco and drug use.  Limiting alcohol use.  Practicing safe sex.  Taking low-dose aspirin every day.  Taking vitamin and mineral supplements as recommended by your health care provider. What happens during an annual well check? The services and screenings done by your health care provider during your annual well check will depend  on your age, overall health, lifestyle risk factors, and family history of disease. Counseling  Your health care provider may ask you questions about your:  Alcohol use.  Tobacco use.  Drug use.  Emotional well-being.  Home and relationship well-being.  Sexual activity.  Eating habits.  History of falls.  Memory and ability to understand (cognition).  Work and work Statistician.  Reproductive health. Screening  You may have the following tests or measurements:  Height, weight, and BMI.  Blood pressure.  Lipid and cholesterol levels. These may be checked every 5 years, or more frequently if you are over 34 years old.  Skin check.  Lung cancer screening. You may have this screening every year starting at age 5 if you have a 30-pack-year history of smoking and currently smoke or have quit within the past 15 years.  Fecal occult blood test (FOBT) of the stool. You may have this test every year starting at age 75.  Flexible sigmoidoscopy or colonoscopy. You may have a sigmoidoscopy every 5 years or a colonoscopy every 10 years starting at age 42.  Hepatitis C blood test.  Hepatitis B blood test.  Sexually transmitted disease (STD) testing.  Diabetes screening. This is done by checking your blood sugar (glucose) after you have not eaten for a while (fasting). You may have this done every 1-3 years.  Bone density scan. This is done to screen for osteoporosis. You may have this done starting at age 75.  Mammogram. This may be done every 1-2 years. Talk to your health care provider about how often you should have regular mammograms. Talk with your health care provider about your test results, treatment options, and if necessary, the need for more tests. Vaccines  Your health care provider may recommend  certain vaccines, such as:  Influenza vaccine. This is recommended every year.  Tetanus, diphtheria, and acellular pertussis (Tdap, Td) vaccine. You may need a Td  booster every 10 years.  Zoster vaccine. You may need this after age 75.  Pneumococcal 13-valent conjugate (PCV13) vaccine. One dose is recommended after age 75.  Pneumococcal polysaccharide (PPSV23) vaccine. One dose is recommended after age 75. Talk to your health care provider about which screenings and vaccines you need and how often you need them. This information is not intended to replace advice given to you by your health care provider. Make sure you discuss any questions you have with your health care provider. Document Released: 04/11/2015 Document Revised: 12/03/2015 Document Reviewed: 01/14/2015 Elsevier Interactive Patient Education  2017 [place] Prevention in the Home Falls can cause injuries. They can happen to people of all ages. There are many things you can do to make your home safe and to help prevent falls. What can I do on the outside of my home?  Regularly fix the edges of walkways and driveways and fix any cracks.  Remove anything that might make you trip as you walk through a door, such as a raised step or threshold.  Trim any bushes or trees on the path to your home.  Use bright outdoor lighting.  Clear any walking paths of anything that might make someone trip, such as rocks or tools.  Regularly check to see if handrails are loose or broken. Make sure that both sides of any steps have handrails.  Any raised decks and porches should have guardrails on the edges.  Have any leaves, snow, or ice cleared regularly.  Use sand or salt on walking paths during winter.  Clean up any spills in your garage right away. This includes oil or grease spills. What can I do in the bathroom?  Use night lights.  Install grab bars by the toilet and in the tub and shower. Do not use towel bars as grab bars.  Use non-skid mats or decals in the tub or shower.  If you need to sit down in the shower, use a plastic, non-slip stool.  Keep the floor dry. Clean up  any water that spills on the floor as soon as it happens.  Remove soap buildup in the tub or shower regularly.  Attach bath mats securely with double-sided non-slip rug tape.  Do not have throw rugs and other things on the floor that can make you trip. What can I do in the bedroom?  Use night lights.  Make sure that you have a light by your bed that is easy to reach.  Do not use any sheets or blankets that are too big for your bed. They should not hang down onto the floor.  Have a firm chair that has side arms. You can use this for support while you get dressed.  Do not have throw rugs and other things on the floor that can make you trip. What can I do in the kitchen?  Clean up any spills right away.  Avoid walking on wet floors.  Keep items that you use a lot in easy-to-reach places.  If you need to reach something above you, use a strong step stool that has a grab bar.  Keep electrical cords out of the way.  Do not use floor polish or wax that makes floors slippery. If you must use wax, use non-skid floor wax.  Do not have throw rugs and other  things on the floor that can make you trip. What can I do with my stairs?  Do not leave any items on the stairs.  Make sure that there are handrails on both sides of the stairs and use them. Fix handrails that are broken or loose. Make sure that handrails are as long as the stairways.  Check any carpeting to make sure that it is firmly attached to the stairs. Fix any carpet that is loose or worn.  Avoid having throw rugs at the top or bottom of the stairs. If you do have throw rugs, attach them to the floor with carpet tape.  Make sure that you have a light switch at the top of the stairs and the bottom of the stairs. If you do not have them, ask someone to add them for you. What else can I do to help prevent falls?  Wear shoes that:  Do not have high heels.  Have rubber bottoms.  Are comfortable and fit you well.  Are  closed at the toe. Do not wear sandals.  If you use a stepladder:  Make sure that it is fully opened. Do not climb a closed stepladder.  Make sure that both sides of the stepladder are locked into place.  Ask someone to hold it for you, if possible.  Clearly mark and make sure that you can see:  Any grab bars or handrails.  First and last steps.  Where the edge of each step is.  Use tools that help you move around (mobility aids) if they are needed. These include:  Canes.  Walkers.  Scooters.  Crutches.  Turn on the lights when you go into a dark area. Replace any light bulbs as soon as they burn out.  Set up your furniture so you have a clear path. Avoid moving your furniture around.  If any of your floors are uneven, fix them.  If there are any pets around you, be aware of where they are.  Review your medicines with your doctor. Some medicines can make you feel dizzy. This can increase your chance of falling. Ask your doctor what other things that you can do to help prevent falls. This information is not intended to replace advice given to you by your health care provider. Make sure you discuss any questions you have with your health care provider. Document Released: 01/09/2009 Document Revised: 08/21/2015 Document Reviewed: 04/19/2014 Elsevier Interactive Patient Education  2017 [employer].

## 2019-12-25 ENCOUNTER — Ambulatory Visit: Payer: Medicare PPO

## 2019-12-25 ENCOUNTER — Other Ambulatory Visit: Payer: Self-pay

## 2019-12-25 ENCOUNTER — Encounter: Payer: Self-pay | Admitting: Family Medicine

## 2019-12-25 ENCOUNTER — Ambulatory Visit (INDEPENDENT_AMBULATORY_CARE_PROVIDER_SITE_OTHER): Payer: Medicare PPO | Admitting: Family Medicine

## 2019-12-25 VITALS — BP 122/68 | HR 69 | Temp 97.9°F | Resp 16 | Ht 64.0 in | Wt 137.0 lb

## 2019-12-25 DIAGNOSIS — Z23 Encounter for immunization: Secondary | ICD-10-CM | POA: Diagnosis not present

## 2019-12-25 DIAGNOSIS — E785 Hyperlipidemia, unspecified: Secondary | ICD-10-CM

## 2019-12-25 DIAGNOSIS — Z0000 Encounter for general adult medical examination without abnormal findings: Secondary | ICD-10-CM | POA: Diagnosis not present

## 2019-12-25 LAB — BASIC METABOLIC PANEL
BUN: 22 mg/dL (ref 6–23)
CO2: 28 mEq/L (ref 19–32)
Calcium: 9.1 mg/dL (ref 8.4–10.5)
Chloride: 102 mEq/L (ref 96–112)
Creatinine, Ser: 0.88 mg/dL (ref 0.40–1.20)
GFR: 62.66 mL/min (ref >60.00)
Glucose, Bld: 92 mg/dL (ref 70–99)
Potassium: 4.2 mEq/L (ref 3.5–5.1)
Sodium: 137 mEq/L (ref 135–145)

## 2019-12-25 LAB — CBC WITH DIFFERENTIAL/PLATELET
Basophils Absolute: 0 10*3/uL (ref 0.0–0.1)
Basophils Relative: 0.5 % (ref 0.0–3.0)
Eosinophils Absolute: 0.2 10*3/uL (ref 0.0–0.7)
Eosinophils Relative: 2.9 % (ref 0.0–5.0)
HCT: 30.7 % — ABNORMAL LOW (ref 36.0–46.0)
Hemoglobin: 9.9 g/dL — ABNORMAL LOW (ref 12.0–15.0)
Lymphocytes Relative: 24.7 % (ref 12.0–46.0)
Lymphs Abs: 1.8 10*3/uL (ref 0.7–4.0)
MCHC: 32.3 g/dL (ref 30.0–36.0)
MCV: 78.6 fl (ref 78.0–100.0)
Monocytes Absolute: 0.5 10*3/uL (ref 0.1–1.0)
Monocytes Relative: 6.9 % (ref 3.0–12.0)
Neutro Abs: 4.7 10*3/uL (ref 1.4–7.7)
Neutrophils Relative %: 65 % (ref 43.0–77.0)
Platelets: 214 10*3/uL (ref 150.0–400.0)
RBC: 3.91 Mil/uL (ref 3.87–5.11)
RDW: 20.2 % — ABNORMAL HIGH (ref 11.5–15.5)
WBC: 7.2 10*3/uL (ref 4.0–10.5)

## 2019-12-25 LAB — LIPID PANEL
Cholesterol: 146 mg/dL (ref 0–200)
HDL: 50.7 mg/dL (ref >39.00)
LDL Cholesterol: 65 mg/dL (ref 0–99)
NonHDL: 94.98
Total CHOL/HDL Ratio: 3
Triglycerides: 152 mg/dL — ABNORMAL HIGH (ref 0.0–149.0)
VLDL: 30.4 mg/dL (ref 0.0–40.0)

## 2019-12-25 LAB — HEPATIC FUNCTION PANEL
ALT: 8 U/L (ref 0–35)
AST: 15 U/L (ref 0–37)
Albumin: 3.6 g/dL (ref 3.5–5.2)
Alkaline Phosphatase: 65 U/L (ref 39–117)
Bilirubin, Direct: 0.1 mg/dL (ref 0.0–0.3)
Total Bilirubin: 0.4 mg/dL (ref 0.2–1.2)
Total Protein: 7 g/dL (ref 6.0–8.3)

## 2019-12-25 LAB — TSH: TSH: 2.09 u[IU]/mL (ref 0.35–4.50)

## 2019-12-25 NOTE — Assessment & Plan Note (Signed)
Pt's PE is unchanged from previous w/ exception of improvement in murmur and valvular click.  UTD on colonoscopy, mammogram, pneumonia vaccines, COVID vaccines.  Flu shot today.  Planning on COVID booster.  Check labs.  Anticipatory guidance provided.

## 2019-12-25 NOTE — Progress Notes (Signed)
   Subjective:    Patient ID: Angel Waller, female    DOB: [birth year], 74 y.o.   MRN: [id number]  HPI CPE- UTD on colonoscopy, mammogram, pneumonia vaccines, COVID vaccine.  Will get flu today.  Plans on COVID booster.  Reviewed past medical, surgical, family and social histories.   Patient Care Team    Relationship Specialty Notifications Start End  Midge Minium, MD PCP - General Family Medicine  08/03/13   Thompson Grayer, MD Consulting Physician Cardiology  07/01/14   Inda Castle, MD (Inactive) Consulting Physician Gastroenterology  07/01/14   Pleasant, Robley Fries., MD Referring Physician Obstetrics and Gynecology  07/03/15   Mackie Pai, MD Referring Physician Cardiology  11/17/16   [place], Amy, Wyatt Physician Dermatology  11/17/16   Paralee Cancel, MD Consulting Physician Orthopedic Surgery  12/14/17      Health Maintenance  Topic Date Due  . INFLUENZA VACCINE  10/28/2019  . MAMMOGRAM  02/14/2020  . TETANUS/TDAP  02/28/2022  . COLONOSCOPY  05/04/2023  . DEXA SCAN  Completed  . COVID-19 Vaccine  Completed  . Hepatitis C Screening  Completed  . PNA vac Low Risk Adult  Completed      Review of Systems Patient reports no vision/ hearing changes, adenopathy,fever, weight change,  persistant/recurrent hoarseness , swallowing issues, chest pain, palpitations, edema, persistant/recurrent cough, hemoptysis, dyspnea (rest/exertional/paroxysmal nocturnal), gastrointestinal bleeding (melena, rectal bleeding), abdominal pain, significant heartburn, bowel changes, GU symptoms (dysuria, hematuria, incontinence), Gyn symptoms (abnormal  bleeding, pain),  syncope, focal weakness, memory loss, numbness & tingling, skin/hair/nail changes, abnormal bruising or bleeding, anxiety, or depression.   This visit occurred during the SARS-CoV-2 public health emergency.  Safety protocols were in place, including screening questions prior to the visit, additional usage of staff  PPE, and extensive cleaning of exam room while observing appropriate contact time as indicated for disinfecting solutions.       Objective:   Physical Exam General Appearance:    Alert, cooperative, no distress, appears stated age  Head:    Normocephalic, without obvious abnormality, atraumatic  Eyes:    PERRL, conjunctiva/corneas clear, EOM's intact, fundi    benign, both eyes  Ears:    Normal TM's and external ear canals, both ears  Nose:   Deferred due to COVID  Throat:   Neck:   Supple, symmetrical, trachea midline, no adenopathy;    Thyroid: no enlargement/tenderness/nodules  Back:     Symmetric, no curvature, ROM normal, no CVA tenderness  Lungs:     Clear to auscultation bilaterally, respirations unlabored  Chest Wall:    No tenderness or deformity   Heart:    Regular rate and rhythm, soft valvular click  Breast Exam:    Deferred to mammo  Abdomen:     Soft, non-tender, bowel sounds active all four quadrants,    no masses, no organomegaly  Genitalia:    Deferred to GYN  Rectal:    Extremities:   Extremities normal, atraumatic, no cyanosis or edema  Pulses:   2+ and symmetric all extremities  Skin:   Skin color, texture, turgor normal, no rashes or lesions  Lymph nodes:   Cervical, supraclavicular, and axillary nodes normal  Neurologic:   CNII-XII intact, normal strength, sensation and reflexes    throughout          Assessment & Plan:

## 2019-12-25 NOTE — Assessment & Plan Note (Signed)
Chronic problem.  Tolerating statin w/o difficulty.  Check labs.  Adjust meds prn  

## 2019-12-25 NOTE — Addendum Note (Signed)
Addended by: Fritz Pickerel on: 12/25/2019 11:01 AM   Modules accepted: Orders

## 2019-12-25 NOTE — Patient Instructions (Signed)
Follow up in 6 months to recheck BP and cholesterol We'll notify you of your lab results and make any changes if needed Keep up the good work!  You look great! Plan on the [place] booster Call with any questions or concerns Stay Safe!  Stay Healthy!

## 2019-12-26 ENCOUNTER — Encounter: Payer: Medicare PPO | Admitting: Family Medicine

## 2019-12-26 ENCOUNTER — Other Ambulatory Visit: Payer: Self-pay | Admitting: General Practice

## 2019-12-26 ENCOUNTER — Encounter: Payer: Self-pay | Admitting: Family Medicine

## 2019-12-26 ENCOUNTER — Ambulatory Visit: Payer: Medicare PPO

## 2019-12-26 DIAGNOSIS — D649 Anemia, unspecified: Secondary | ICD-10-CM

## 2020-01-03 DIAGNOSIS — E782 Mixed hyperlipidemia: Secondary | ICD-10-CM | POA: Diagnosis not present

## 2020-01-03 DIAGNOSIS — Z8679 Personal history of other diseases of the circulatory system: Secondary | ICD-10-CM | POA: Diagnosis not present

## 2020-01-03 DIAGNOSIS — Z952 Presence of prosthetic heart valve: Secondary | ICD-10-CM | POA: Diagnosis not present

## 2020-01-03 DIAGNOSIS — Z950 Presence of cardiac pacemaker: Secondary | ICD-10-CM | POA: Diagnosis not present

## 2020-01-03 DIAGNOSIS — I442 Atrioventricular block, complete: Secondary | ICD-10-CM | POA: Diagnosis not present

## 2020-01-03 DIAGNOSIS — I480 Paroxysmal atrial fibrillation: Secondary | ICD-10-CM | POA: Diagnosis not present

## 2020-01-04 DIAGNOSIS — Z9889 Other specified postprocedural states: Secondary | ICD-10-CM | POA: Diagnosis not present

## 2020-01-04 DIAGNOSIS — Z1231 Encounter for screening mammogram for malignant neoplasm of breast: Secondary | ICD-10-CM | POA: Diagnosis not present

## 2020-01-10 ENCOUNTER — Ambulatory Visit (INDEPENDENT_AMBULATORY_CARE_PROVIDER_SITE_OTHER): Payer: Medicare PPO

## 2020-01-10 DIAGNOSIS — I442 Atrioventricular block, complete: Secondary | ICD-10-CM | POA: Diagnosis not present

## 2020-01-12 LAB — CUP PACEART REMOTE DEVICE CHECK
Battery Impedance: 1163 Ohm
Battery Remaining Longevity: 53 mo
Battery Voltage: 2.79 V
Brady Statistic AP VP Percent: 36 %
Brady Statistic AP VS Percent: 0 %
Brady Statistic AS VP Percent: 64 %
Brady Statistic AS VS Percent: 0 %
Date Time Interrogation Session: 20211015154436
Implantable Lead Implant Date: 20131205
Implantable Lead Implant Date: 20131205
Implantable Lead Location: 753859
Implantable Lead Location: 753860
Implantable Lead Model: 5076
Implantable Lead Model: 5092
Implantable Pulse Generator Implant Date: 20131205
Lead Channel Impedance Value: 404 Ohm
Lead Channel Impedance Value: 711 Ohm
Lead Channel Pacing Threshold Amplitude: 0.625 V
Lead Channel Pacing Threshold Amplitude: 0.75 V
Lead Channel Pacing Threshold Pulse Width: 0.4 ms
Lead Channel Pacing Threshold Pulse Width: 0.4 ms
Lead Channel Setting Pacing Amplitude: 2 V
Lead Channel Setting Pacing Amplitude: 2.5 V
Lead Channel Setting Pacing Pulse Width: 0.4 ms
Lead Channel Setting Sensing Sensitivity: 2.8 mV

## 2020-01-16 NOTE — Progress Notes (Signed)
Remote pacemaker transmission.   

## 2020-01-22 ENCOUNTER — Ambulatory Visit: Payer: Medicare PPO

## 2020-01-22 DIAGNOSIS — I071 Rheumatic tricuspid insufficiency: Secondary | ICD-10-CM | POA: Diagnosis not present

## 2020-01-22 DIAGNOSIS — Z953 Presence of xenogenic heart valve: Secondary | ICD-10-CM | POA: Diagnosis not present

## 2020-01-22 DIAGNOSIS — I517 Cardiomegaly: Secondary | ICD-10-CM | POA: Diagnosis not present

## 2020-01-22 DIAGNOSIS — I4430 Unspecified atrioventricular block: Secondary | ICD-10-CM | POA: Diagnosis not present

## 2020-01-22 DIAGNOSIS — R001 Bradycardia, unspecified: Secondary | ICD-10-CM | POA: Diagnosis not present

## 2020-01-22 DIAGNOSIS — Z09 Encounter for follow-up examination after completed treatment for conditions other than malignant neoplasm: Secondary | ICD-10-CM | POA: Diagnosis not present

## 2020-01-22 DIAGNOSIS — I081 Rheumatic disorders of both mitral and tricuspid valves: Secondary | ICD-10-CM | POA: Diagnosis not present

## 2020-01-22 DIAGNOSIS — Z950 Presence of cardiac pacemaker: Secondary | ICD-10-CM | POA: Diagnosis not present

## 2020-01-22 DIAGNOSIS — Z8774 Personal history of (corrected) congenital malformations of heart and circulatory system: Secondary | ICD-10-CM | POA: Diagnosis not present

## 2020-01-23 ENCOUNTER — Ambulatory Visit (INDEPENDENT_AMBULATORY_CARE_PROVIDER_SITE_OTHER): Payer: Medicare PPO

## 2020-01-23 ENCOUNTER — Other Ambulatory Visit: Payer: Self-pay

## 2020-01-23 DIAGNOSIS — D649 Anemia, unspecified: Secondary | ICD-10-CM | POA: Diagnosis not present

## 2020-01-23 LAB — CBC WITH DIFFERENTIAL/PLATELET
Basophils Absolute: 0 10*3/uL (ref 0.0–0.1)
Basophils Relative: 0.4 % (ref 0.0–3.0)
Eosinophils Absolute: 0.1 10*3/uL (ref 0.0–0.7)
Eosinophils Relative: 2.4 % (ref 0.0–5.0)
HCT: 34 % — ABNORMAL LOW (ref 36.0–46.0)
Hemoglobin: 11 g/dL — ABNORMAL LOW (ref 12.0–15.0)
Lymphocytes Relative: 27.3 % (ref 12.0–46.0)
Lymphs Abs: 1.6 10*3/uL (ref 0.7–4.0)
MCHC: 32.5 g/dL (ref 30.0–36.0)
MCV: 82 fl (ref 78.0–100.0)
Monocytes Absolute: 0.4 10*3/uL (ref 0.1–1.0)
Monocytes Relative: 6.8 % (ref 3.0–12.0)
Neutro Abs: 3.7 10*3/uL (ref 1.4–7.7)
Neutrophils Relative %: 63.1 % (ref 43.0–77.0)
Platelets: 181 10*3/uL (ref 150.0–400.0)
RBC: 4.14 Mil/uL (ref 3.87–5.11)
RDW: 22.3 % — ABNORMAL HIGH (ref 11.5–15.5)
WBC: 5.9 10*3/uL (ref 4.0–10.5)

## 2020-01-24 ENCOUNTER — Encounter: Payer: Self-pay | Admitting: Family Medicine

## 2020-02-11 DIAGNOSIS — H353131 Nonexudative age-related macular degeneration, bilateral, early dry stage: Secondary | ICD-10-CM | POA: Diagnosis not present

## 2020-02-11 DIAGNOSIS — H25013 Cortical age-related cataract, bilateral: Secondary | ICD-10-CM | POA: Diagnosis not present

## 2020-02-11 DIAGNOSIS — H2513 Age-related nuclear cataract, bilateral: Secondary | ICD-10-CM | POA: Diagnosis not present

## 2020-03-07 DIAGNOSIS — Z1231 Encounter for screening mammogram for malignant neoplasm of breast: Secondary | ICD-10-CM | POA: Diagnosis not present

## 2020-06-17 DIAGNOSIS — Z20822 Contact with and (suspected) exposure to covid-19: Secondary | ICD-10-CM | POA: Diagnosis not present

## 2020-06-18 DIAGNOSIS — Z20822 Contact with and (suspected) exposure to covid-19: Secondary | ICD-10-CM | POA: Diagnosis not present

## 2020-06-18 DIAGNOSIS — Z87891 Personal history of nicotine dependence: Secondary | ICD-10-CM | POA: Diagnosis not present

## 2020-06-18 DIAGNOSIS — I5022 Chronic systolic (congestive) heart failure: Secondary | ICD-10-CM | POA: Diagnosis not present

## 2020-06-18 DIAGNOSIS — Z953 Presence of xenogenic heart valve: Secondary | ICD-10-CM | POA: Diagnosis not present

## 2020-06-18 DIAGNOSIS — I480 Paroxysmal atrial fibrillation: Secondary | ICD-10-CM | POA: Diagnosis not present

## 2020-06-18 DIAGNOSIS — Z880 Allergy status to penicillin: Secondary | ICD-10-CM | POA: Diagnosis not present

## 2020-06-18 DIAGNOSIS — Z950 Presence of cardiac pacemaker: Secondary | ICD-10-CM | POA: Diagnosis not present

## 2020-06-18 DIAGNOSIS — I442 Atrioventricular block, complete: Secondary | ICD-10-CM | POA: Diagnosis not present

## 2020-06-18 DIAGNOSIS — Z79899 Other long term (current) drug therapy: Secondary | ICD-10-CM | POA: Diagnosis not present

## 2020-06-18 DIAGNOSIS — E785 Hyperlipidemia, unspecified: Secondary | ICD-10-CM | POA: Diagnosis not present

## 2020-06-19 DIAGNOSIS — Z79899 Other long term (current) drug therapy: Secondary | ICD-10-CM | POA: Diagnosis not present

## 2020-06-19 DIAGNOSIS — J90 Pleural effusion, not elsewhere classified: Secondary | ICD-10-CM | POA: Diagnosis not present

## 2020-06-19 DIAGNOSIS — Z880 Allergy status to penicillin: Secondary | ICD-10-CM | POA: Diagnosis not present

## 2020-06-19 DIAGNOSIS — Z20822 Contact with and (suspected) exposure to covid-19: Secondary | ICD-10-CM | POA: Diagnosis not present

## 2020-06-19 DIAGNOSIS — I5022 Chronic systolic (congestive) heart failure: Secondary | ICD-10-CM | POA: Diagnosis not present

## 2020-06-19 DIAGNOSIS — Z87891 Personal history of nicotine dependence: Secondary | ICD-10-CM | POA: Diagnosis not present

## 2020-06-19 DIAGNOSIS — I442 Atrioventricular block, complete: Secondary | ICD-10-CM | POA: Diagnosis not present

## 2020-06-19 DIAGNOSIS — Z953 Presence of xenogenic heart valve: Secondary | ICD-10-CM | POA: Diagnosis not present

## 2020-06-19 DIAGNOSIS — E785 Hyperlipidemia, unspecified: Secondary | ICD-10-CM | POA: Diagnosis not present

## 2020-06-19 DIAGNOSIS — Z950 Presence of cardiac pacemaker: Secondary | ICD-10-CM | POA: Diagnosis not present

## 2020-06-19 DIAGNOSIS — I480 Paroxysmal atrial fibrillation: Secondary | ICD-10-CM | POA: Diagnosis not present

## 2020-06-24 ENCOUNTER — Ambulatory Visit: Payer: Medicare PPO | Admitting: Family Medicine

## 2020-07-07 ENCOUNTER — Encounter: Payer: Medicare PPO | Admitting: Internal Medicine

## 2020-07-07 DIAGNOSIS — I480 Paroxysmal atrial fibrillation: Secondary | ICD-10-CM

## 2020-07-07 DIAGNOSIS — I428 Other cardiomyopathies: Secondary | ICD-10-CM

## 2020-07-07 DIAGNOSIS — I472 Ventricular tachycardia: Secondary | ICD-10-CM

## 2020-07-07 DIAGNOSIS — I442 Atrioventricular block, complete: Secondary | ICD-10-CM

## 2020-07-10 ENCOUNTER — Ambulatory Visit: Payer: Medicare PPO | Admitting: Family Medicine

## 2020-07-10 ENCOUNTER — Encounter: Payer: Self-pay | Admitting: Family Medicine

## 2020-07-10 ENCOUNTER — Other Ambulatory Visit: Payer: Self-pay

## 2020-07-10 ENCOUNTER — Other Ambulatory Visit (INDEPENDENT_AMBULATORY_CARE_PROVIDER_SITE_OTHER): Payer: Medicare PPO

## 2020-07-10 VITALS — BP 112/70 | HR 63 | Temp 97.5°F | Resp 20 | Ht 64.0 in | Wt 139.4 lb

## 2020-07-10 DIAGNOSIS — I5022 Chronic systolic (congestive) heart failure: Secondary | ICD-10-CM

## 2020-07-10 DIAGNOSIS — I442 Atrioventricular block, complete: Secondary | ICD-10-CM | POA: Diagnosis not present

## 2020-07-10 DIAGNOSIS — H5203 Hypermetropia, bilateral: Secondary | ICD-10-CM | POA: Diagnosis not present

## 2020-07-10 DIAGNOSIS — H52223 Regular astigmatism, bilateral: Secondary | ICD-10-CM | POA: Diagnosis not present

## 2020-07-10 DIAGNOSIS — E785 Hyperlipidemia, unspecified: Secondary | ICD-10-CM

## 2020-07-10 DIAGNOSIS — H524 Presbyopia: Secondary | ICD-10-CM | POA: Diagnosis not present

## 2020-07-10 LAB — HEPATIC FUNCTION PANEL
ALT: 13 U/L (ref 0–35)
AST: 21 U/L (ref 0–37)
Albumin: 3.6 g/dL (ref 3.5–5.2)
Alkaline Phosphatase: 61 U/L (ref 39–117)
Bilirubin, Direct: 0.1 mg/dL (ref 0.0–0.3)
Total Bilirubin: 0.4 mg/dL (ref 0.2–1.2)
Total Protein: 7.5 g/dL (ref 6.0–8.3)

## 2020-07-10 LAB — LIPID PANEL
Cholesterol: 148 mg/dL (ref 0–200)
HDL: 52.7 mg/dL (ref >39.00)
LDL Cholesterol: 69 mg/dL (ref 0–99)
NonHDL: 95.24
Total CHOL/HDL Ratio: 3
Triglycerides: 131 mg/dL (ref 0.0–149.0)
VLDL: 26.2 mg/dL (ref 0.0–40.0)

## 2020-07-10 NOTE — Assessment & Plan Note (Signed)
Chronic problem.  Following w/ cardiology both here and at [facility].  On beta blocker (Coreg), ARB (Losartan), Torsemide w/ good volume/symptom control.  Will follow along.

## 2020-07-10 NOTE — Progress Notes (Signed)
   Subjective:    Patient ID: Angel Waller, female    DOB: 11/19/1945, 75 y.o.   MRN: [id number]  HPI Hyperlipidemia- chronic problem, on Lipitor 40mg  daily.  No abd pain, N/V.  Pt is walking regularly  CHF- chronic problem, on Coreg 3.125 BID, Losartan 25mg  daily, Torsemide 10mg  w/ good volume/symptom control.  No CP, SOB, edema.  Complete heart block/New pacemaker- implanted 3/23.  Pt feels things are going well.  Frustrated w/ inability to raise L arm overhead b/c she is L handed.  Restrictions in place x6 weeks.  Review of Systems For ROS see HPI   This visit occurred during the SARS-CoV-2 public health emergency.  Safety protocols were in place, including screening questions prior to the visit, additional usage of staff PPE, and extensive cleaning of exam room while observing appropriate contact time as indicated for disinfecting solutions.       Objective:   Physical Exam Vitals reviewed.  Constitutional:      General: She is not in acute distress.    Appearance: Normal appearance. She is well-developed. She is not ill-appearing.  HENT:     Head: Normocephalic and atraumatic.  Eyes:     Conjunctiva/sclera: Conjunctivae normal.     Pupils: Pupils are equal, round, and reactive to light.  Neck:     Thyroid: No thyromegaly.  Cardiovascular:     Rate and Rhythm: Normal rate and regular rhythm.     Pulses: Normal pulses.     Heart sounds: Normal heart sounds. No murmur heard.     Comments: Incision over L upper chest wall is healing nicely, some swelling surrounding pacer pocket remains Pulmonary:     Effort: Pulmonary effort is normal. No respiratory distress.     Breath sounds: Normal breath sounds.  Abdominal:     General: There is no distension.     Palpations: Abdomen is soft.     Tenderness: There is no abdominal tenderness.  Musculoskeletal:     Cervical back: Normal range of motion and neck supple.     Right lower leg: No edema.     Left lower leg: No edema.   Lymphadenopathy:     Cervical: No cervical adenopathy.  Skin:    General: Skin is warm and dry.  Neurological:     Mental Status: She is alert and oriented to person, place, and time.  Psychiatric:        Behavior: Behavior normal.           Assessment & Plan:

## 2020-07-10 NOTE — Assessment & Plan Note (Signed)
New pacer implanted on 3/23 and healing well.

## 2020-07-10 NOTE — Assessment & Plan Note (Signed)
Chronic problem.  On Lipitor 40mg  daily.  Check labs.  Adjust meds prn

## 2020-07-10 NOTE — Patient Instructions (Signed)
Schedule your complete physical in 6 months We'll notify you of your lab results and make any changes if needed Continue to work on healthy diet and regular exercise- you look great! Call with any questions or concerns Stay Safe!  Stay Healthy! Happy [place]!!

## 2020-07-29 DIAGNOSIS — Z09 Encounter for follow-up examination after completed treatment for conditions other than malignant neoplasm: Secondary | ICD-10-CM | POA: Diagnosis not present

## 2020-07-29 DIAGNOSIS — Z950 Presence of cardiac pacemaker: Secondary | ICD-10-CM | POA: Diagnosis not present

## 2020-08-08 DIAGNOSIS — D692 Other nonthrombocytopenic purpura: Secondary | ICD-10-CM | POA: Diagnosis not present

## 2020-08-08 DIAGNOSIS — D1801 Hemangioma of skin and subcutaneous tissue: Secondary | ICD-10-CM | POA: Diagnosis not present

## 2020-08-08 DIAGNOSIS — L821 Other seborrheic keratosis: Secondary | ICD-10-CM | POA: Diagnosis not present

## 2020-09-04 DIAGNOSIS — Z950 Presence of cardiac pacemaker: Secondary | ICD-10-CM | POA: Diagnosis not present

## 2020-09-04 DIAGNOSIS — Z952 Presence of prosthetic heart valve: Secondary | ICD-10-CM | POA: Diagnosis not present

## 2020-09-04 DIAGNOSIS — I480 Paroxysmal atrial fibrillation: Secondary | ICD-10-CM | POA: Diagnosis not present

## 2020-09-04 DIAGNOSIS — E782 Mixed hyperlipidemia: Secondary | ICD-10-CM | POA: Diagnosis not present

## 2020-09-24 ENCOUNTER — Encounter: Payer: Self-pay | Admitting: *Deleted

## 2020-12-29 ENCOUNTER — Ambulatory Visit (INDEPENDENT_AMBULATORY_CARE_PROVIDER_SITE_OTHER): Payer: Medicare PPO | Admitting: *Deleted

## 2020-12-29 DIAGNOSIS — H02831 Dermatochalasis of right upper eyelid: Secondary | ICD-10-CM | POA: Diagnosis not present

## 2020-12-29 DIAGNOSIS — Z0000 Encounter for general adult medical examination without abnormal findings: Secondary | ICD-10-CM | POA: Diagnosis not present

## 2020-12-29 DIAGNOSIS — H353131 Nonexudative age-related macular degeneration, bilateral, early dry stage: Secondary | ICD-10-CM | POA: Diagnosis not present

## 2020-12-29 DIAGNOSIS — H02834 Dermatochalasis of left upper eyelid: Secondary | ICD-10-CM | POA: Diagnosis not present

## 2020-12-29 DIAGNOSIS — H25013 Cortical age-related cataract, bilateral: Secondary | ICD-10-CM | POA: Diagnosis not present

## 2020-12-29 NOTE — Patient Instructions (Signed)
Angel Waller , Thank you for taking time to come for your Medicare Wellness Visit. I appreciate your ongoing commitment to your health goals. Please review the following plan we discussed and let me know if I can assist you in the future.   Screening recommendations/referrals: Colonoscopy: up to date Mammogram: up to date Bone Density: Education provided Recommended yearly ophthalmology/optometry visit for glaucoma screening and checkup Recommended yearly dental visit for hygiene and checkup  Vaccinations: Influenza vaccine: Education provided Pneumococcal vaccine: up to date Tdap vaccine: up to date Shingles vaccine: up to date    Advanced directives: yes on file  Conditions/risks identified:   Next appointment: 01-09-2021 @ 9:00 Dr. Birdie Riddle   Preventive Care 76 Years and Older, Female Preventive care refers to lifestyle choices and visits with your health care provider that can promote health and wellness. What does preventive care include? A yearly physical exam. This is also called an annual well check. Dental exams once or twice a year. Routine eye exams. Ask your health care provider how often you should have your eyes checked. Personal lifestyle choices, including: Daily care of your teeth and gums. Regular physical activity. Eating a healthy diet. Avoiding tobacco and drug use. Limiting alcohol use. Practicing safe sex. Taking low-dose aspirin every day. Taking vitamin and mineral supplements as recommended by your health care provider. What happens during an annual well check? The services and screenings done by your health care provider during your annual well check will depend on your age, overall health, lifestyle risk factors, and family history of disease. Counseling  Your health care provider may ask you questions about your: Alcohol use. Tobacco use. Drug use. Emotional well-being. Home and relationship well-being. Sexual activity. Eating habits. History  of falls. Memory and ability to understand (cognition). Work and work Statistician. Reproductive health. Screening  You may have the following tests or measurements: Height, weight, and BMI. Blood pressure. Lipid and cholesterol levels. These may be checked every 5 years, or more frequently if you are over 64 years old. Skin check. Lung cancer screening. You may have this screening every year starting at age 59 if you have a 30-pack-year history of smoking and currently smoke or have quit within the past 15 years. Fecal occult blood test (FOBT) of the stool. You may have this test every year starting at age 74. Flexible sigmoidoscopy or colonoscopy. You may have a sigmoidoscopy every 5 years or a colonoscopy every 10 years starting at age 62. Hepatitis C blood test. Hepatitis B blood test. Sexually transmitted disease (STD) testing. Diabetes screening. This is done by checking your blood sugar (glucose) after you have not eaten for a while (fasting). You may have this done every 1-3 years. Bone density scan. This is done to screen for osteoporosis. You may have this done starting at age 26. Mammogram. This may be done every 1-2 years. Talk to your health care provider about how often you should have regular mammograms. Talk with your health care provider about your test results, treatment options, and if necessary, the need for more tests. Vaccines  Your health care provider may recommend certain vaccines, such as: Influenza vaccine. This is recommended every year. Tetanus, diphtheria, and acellular pertussis (Tdap, Td) vaccine. You may need a Td booster every 10 years. Zoster vaccine. You may need this after age 76. Pneumococcal 13-valent conjugate (PCV13) vaccine. One dose is recommended after age 27. Pneumococcal polysaccharide (PPSV23) vaccine. One dose is recommended after age 86. Talk to your health care  provider about which screenings and vaccines you need and how often you need  them. This information is not intended to replace advice given to you by your health care provider. Make sure you discuss any questions you have with your health care provider. Document Released: 04/11/2015 Document Revised: 12/03/2015 Document Reviewed: 01/14/2015 Elsevier Interactive Patient Education  2017 [name] Prevention in the Home Falls can cause injuries. They can happen to people of all ages. There are many things you can do to make your home safe and to help prevent falls. What can I do on the outside of my home? Regularly fix the edges of walkways and driveways and fix any cracks. Remove anything that might make you trip as you walk through a door, such as a raised step or threshold. Trim any bushes or trees on the path to your home. Use bright outdoor lighting. Clear any walking paths of anything that might make someone trip, such as rocks or tools. Regularly check to see if handrails are loose or broken. Make sure that both sides of any steps have handrails. Any raised decks and porches should have guardrails on the edges. Have any leaves, snow, or ice cleared regularly. Use sand or salt on walking paths during winter. Clean up any spills in your garage right away. This includes oil or grease spills. What can I do in the bathroom? Use night lights. Install grab bars by the toilet and in the tub and shower. Do not use towel bars as grab bars. Use non-skid mats or decals in the tub or shower. If you need to sit down in the shower, use a plastic, non-slip stool. Keep the floor dry. Clean up any water that spills on the floor as soon as it happens. Remove soap buildup in the tub or shower regularly. Attach bath mats securely with double-sided non-slip rug tape. Do not have throw rugs and other things on the floor that can make you trip. What can I do in the bedroom? Use night lights. Make sure that you have a light by your bed that is easy to reach. Do not use  any sheets or blankets that are too big for your bed. They should not hang down onto the floor. Have a firm chair that has side arms. You can use this for support while you get dressed. Do not have throw rugs and other things on the floor that can make you trip. What can I do in the kitchen? Clean up any spills right away. Avoid walking on wet floors. Keep items that you use a lot in easy-to-reach places. If you need to reach something above you, use a strong step stool that has a grab bar. Keep electrical cords out of the way. Do not use floor polish or wax that makes floors slippery. If you must use wax, use non-skid floor wax. Do not have throw rugs and other things on the floor that can make you trip. What can I do with my stairs? Do not leave any items on the stairs. Make sure that there are handrails on both sides of the stairs and use them. Fix handrails that are broken or loose. Make sure that handrails are as long as the stairways. Check any carpeting to make sure that it is firmly attached to the stairs. Fix any carpet that is loose or worn. Avoid having throw rugs at the top or bottom of the stairs. If you do have throw rugs, attach them to the  floor with carpet tape. Make sure that you have a light switch at the top of the stairs and the bottom of the stairs. If you do not have them, ask someone to add them for you. What else can I do to help prevent falls? Wear shoes that: Do not have high heels. Have rubber bottoms. Are comfortable and fit you well. Are closed at the toe. Do not wear sandals. If you use a stepladder: Make sure that it is fully opened. Do not climb a closed stepladder. Make sure that both sides of the stepladder are locked into place. Ask someone to hold it for you, if possible. Clearly mark and make sure that you can see: Any grab bars or handrails. First and last steps. Where the edge of each step is. Use tools that help you move around (mobility aids)  if they are needed. These include: Canes. Walkers. Scooters. Crutches. Turn on the lights when you go into a dark area. Replace any light bulbs as soon as they burn out. Set up your furniture so you have a clear path. Avoid moving your furniture around. If any of your floors are uneven, fix them. If there are any pets around you, be aware of where they are. Review your medicines with your doctor. Some medicines can make you feel dizzy. This can increase your chance of falling. Ask your doctor what other things that you can do to help prevent falls. This information is not intended to replace advice given to you by your health care provider. Make sure you discuss any questions you have with your health care provider. Document Released: 01/09/2009 Document Revised: 08/21/2015 Document Reviewed: 04/19/2014 Elsevier Interactive Patient Education  2017 [employer].

## 2020-12-29 NOTE — Progress Notes (Signed)
Subjective:   Angel Waller is a 75 y.o. female who presents for Medicare Annual (Subsequent) preventive examination.  I connected with  [name] on 12/29/20 by a telephoneenabled telemedicine application and verified that I am speaking with the correct person using two identifiers.   I discussed the limitations of evaluation and management by telemedicine. The patient expressed understanding and agreed to proceed.   Review of Systems     Cardiac Risk Factors include: advanced age (>23men, >31 women);hypertension     Objective:    Today's Vitals   There is no height or weight on file to calculate BMI.  Advanced Directives 12/29/2020 12/24/2019 11/10/2018 11/09/2018 12/14/2017 04/17/2017 11/17/2016  Does Patient Have a Medical Advance Directive? Yes Yes Yes No Yes No Yes  Type of Paramedic of [place];Living will [place];Living will - Living will;Healthcare Power of [place];Living will  Does patient want to make changes to medical advance directive? No - Patient declined - No - Patient declined - - - -  Copy of [place] in Chart? Yes - validated most recent copy scanned in chart (See row information) No - copy requested No - copy requested - No - copy requested - No - copy requested  Would patient like information on creating a medical advance directive? - - No - Patient declined - - No - Patient declined -  Pre-existing out of facility DNR order (yellow form or pink MOST form) - - - - - - -    Current Medications (verified) Outpatient Encounter Medications as of 12/29/2020  Medication Sig   aspirin 81 MG EC tablet Take 1 tablet by mouth daily.   atorvastatin (LIPITOR) 40 MG tablet TAKE 1 TABLET BY MOUTH DAILY   carvedilol (COREG) 3.125 MG tablet Take 3.125 mg by mouth 2 (two) times daily with a meal.    Cholecalciferol 25 MCG (1000 UT) capsule Take 2,000  Units by mouth daily.    estradiol (ESTRACE) 2 MG tablet Take 2 mg by mouth daily.    losartan (COZAAR) 25 MG tablet Take 25 mg by mouth daily.    omega-3 acid ethyl esters (LOVAZA) 1 g capsule    senna (SENOKOT) 8.6 MG TABS tablet Take 2 tablets by mouth 2 (two) times daily as needed for mild constipation.   torsemide (DEMADEX) 10 MG tablet Take 10 mg by mouth daily.    tretinoin microspheres (RETIN-A MICRO) 0.1 % gel Apply at bedtime as directed   clindamycin (CLEOCIN) 300 MG capsule  (Patient not taking: Reported on 12/29/2020)   norethindrone (AYGESTIN) 5 MG tablet  (Patient not taking: No sig reported)   No facility-administered encounter medications on file as of 12/29/2020.    Allergies (verified) Amoxicillin   History: Past Medical History:  Diagnosis Date   Allergy    Aortic stenosis    Arthritis    "right knee" (02/29/2012)   Bicuspid aortic valve 09/2008   STATUS POST BIOPROSTHETIC AORTIC VALVE REPLACEMENT   Blood transfusion without reported diagnosis    Complete heart block (HCC)    Heart murmur    Hyperlipemia    Migraines    "occasionally" (02/29/2012)   Osteopenia    Pacemaker    S/P AVR (aortic valve replacement)    July 2010   Seasonal allergies    Past Surgical History:  Procedure Laterality Date   ABDOMINAL HYSTERECTOMY     CARDIAC VALVE  REPLACEMENT  2010   tissue aortic valve; [place]   COLONOSCOPY  2005   negative; [place], [place]   INSERT / REPLACE / REMOVE PACEMAKER     KNEE ARTHROSCOPY Right    PERMANENT PACEMAKER INSERTION  03/02/2012   Medtronic Adapta L implanted by Dr Rayann Heman   PERMANENT PACEMAKER INSERTION N/A 03/02/2012   Procedure: PERMANENT PACEMAKER INSERTION;  Surgeon: Thompson Grayer, MD;  Location: [facility] CATH LAB;  Service: Cardiovascular;  Laterality: N/A;   POLYPECTOMY     TEMPORARY PACEMAKER INSERTION N/A 03/01/2012   Procedure: TEMPORARY PACEMAKER INSERTION;  Surgeon: Burnell Blanks, MD;  Location: [facility] CATH LAB;  Service:  Cardiovascular;  Laterality: N/A;   TONSILLECTOMY AND ADENOIDECTOMY  1962   TOTAL KNEE ARTHROPLASTY Right 01/07/2014   Procedure: RIGHT TOTAL KNEE ARTHROPLASTY;  Surgeon: Mauri Pole, MD;  Location: [facility] ORS;  Service: Orthopedics;  Laterality: Right;   Family History  Problem Relation Age of Onset   Heart attack Mother 53       CABG   Heart attack Father 26       aneurysm post MI   Colon polyps Father    Heart disease Other        in both M & P uncles & aunts; no premature MI   Diabetes Neg Hx    Stroke Neg Hx    Colon cancer Neg Hx    Pancreatic cancer Neg Hx    Rectal cancer Neg Hx    Stomach cancer Neg Hx    Social History   Socioeconomic History   Marital status: Married    Spouse name: Not on file   Number of children: Not on file   Years of education: Not on file   Highest education level: Not on file  Occupational History   Occupation: retired  Tobacco Use   Smoking status: Former    Packs/day: 0.50    Years: 42.00    Pack years: 21.00    Types: Cigarettes    Quit date: 03/30/2007    Years since quitting: 13.7   Smokeless tobacco: Never   Tobacco comments:    smoked ages [age], up to 71 pp WEEK  Vaping Use   Vaping Use: Never used  Substance and Sexual Activity   Alcohol use: No   Drug use: No   Sexual activity: Yes  Other Topics Concern   Not on file  Social History Narrative   Not on file   Social Determinants of Health   Financial Resource Strain: Low Risk    Difficulty of Paying Living Expenses: Not hard at all  Food Insecurity: No Food Insecurity   Worried About Charity fundraiser in the Last Year: Never true   Ran Out of Food in the Last Year: Never true  Transportation Needs: No Transportation Needs   Lack of Transportation (Medical): No   Lack of Transportation (Non-Medical): No  Physical Activity: Insufficiently Active   Days of Exercise per Week: 3 days   Minutes of Exercise per Session: 20 min  Stress: No Stress Concern Present    Feeling of Stress : Not at all  Social Connections: Moderately Integrated   Frequency of Communication with Friends and Family: More than three times a week   Frequency of Social Gatherings with Friends and Family: More than three times a week   Attends Religious Services: More than 4 times per year   Active Member of [employer] or Organizations: No   Attends Archivist  Meetings: Never   Marital Status: Married    Tobacco Counseling Counseling given: Not Answered Tobacco comments: smoked ages [age], up to 1 pp WEEK   Clinical Intake:  Pre-visit preparation completed: No  Pain : No/denies pain     Nutritional Risks: None  How often do you need to have someone help you when you read instructions, pamphlets, or other written materials from your doctor or pharmacy?: 1 - Never  Diabetic?no  Interpreter Needed?: No  Information entered by :: Leroy Kennedy LPN   Activities of Daily Living In your present state of health, do you have any difficulty performing the following activities: 12/29/2020 07/10/2020  Hearing? N N  Vision? N N  Difficulty concentrating or making decisions? N N  Walking or climbing stairs? N N  Dressing or bathing? N N  Doing errands, shopping? N N  Preparing Food and eating ? N -  Using the Toilet? N -  In the past six months, have you accidently leaked urine? N -  Do you have problems with loss of bowel control? N -  Managing your Medications? N -  Managing your Finances? N -  Housekeeping or managing your Housekeeping? N -  Some recent data might be hidden    Patient Care Team: Midge Minium, MD as PCP - General (Family Medicine) Thompson Grayer, MD as Consulting Physician (Cardiology) Inda Castle, MD (Inactive) as Consulting Physician (Gastroenterology) Pleasant, Robley Fries., MD as Referring Physician (Obstetrics and Gynecology) Mackie Pai, MD as Referring Physician (Cardiology) Martinique, Amy, MD as Consulting  Physician (Dermatology) Paralee Cancel, MD as Consulting Physician (Orthopedic Surgery)  Indicate any recent Medical Services you may have received from other than [facility] providers in the past year (date may be approximate).     Assessment:   This is a routine wellness examination for Joleene.  Hearing/Vision screen Hearing Screening - Comments:: No trouble hearing Vision Screening - Comments:: Not up to date Dr. Sharen Counter  Dietary issues and exercise activities discussed: Current Exercise Habits: Home exercise routine, Time (Minutes): 20, Frequency (Times/Week): 3, Weekly Exercise (Minutes/Week): 60, Intensity: Mild, Exercise limited by: None identified   Goals Addressed             This Visit's Progress    patient    On track    Maintain current health by staying active.      Patient Stated       Increase physical activity       Depression Screen PHQ 2/9 Scores 12/29/2020 07/10/2020 12/25/2019 12/24/2019 06/19/2019 12/20/2018 06/14/2018  PHQ - 2 Score 0 0 0 0 0 0 0  PHQ- 9 Score - 0 0 - 0 0 -    Fall Risk Fall Risk  12/29/2020 07/10/2020 12/25/2019 12/24/2019 06/19/2019  Falls in the past year? 0 0 0 0 0  Number falls in past yr: 0 0 0 0 0  Injury with Fall? 0 0 0 0 0  Risk for fall due to : - No Fall Risks - - -  Follow up Falls evaluation completed;Falls prevention discussed - Falls evaluation completed Falls prevention discussed Falls evaluation completed    FALL RISK PREVENTION PERTAINING TO THE HOME:  Any stairs in or around the home? Yes  If so, are there any without handrails? No  Home free of loose throw rugs in walkways, pet beds, electrical cords, etc? Yes  Adequate lighting in your home to reduce risk of falls? Yes   ASSISTIVE DEVICES UTILIZED TO PREVENT  FALLS:  Life alert? No  Use of a cane, walker or w/c? No  Grab bars in the bathroom? Yes  Shower chair or bench in shower? Yes  Elevated toilet seat or a handicapped toilet? Yes   TIMED UP AND GO:  Was the test  performed? No .    Cognitive Function:  Normal cognitive status assessed by direct observation by this Nurse Health Advisor. No abnormalities found.   MMSE - Mini Mental State Exam 12/14/2017  Orientation to time 5  Orientation to Place 5  Registration 3  Attention/ Calculation 5  Recall 3  Language- name 2 objects 2  Language- repeat 1  Language- follow 3 step command 3  Language- read & follow direction 1  Write a sentence 1  Copy design 1  Total score 30        Immunizations Immunization History  Administered Date(s) Administered   Fluad Quad(high Dose 65+) 12/20/2018, 12/25/2019   Influenza Split 03/02/2011   Influenza Whole 02/24/2010   Influenza, High Dose Seasonal PF 12/14/2017, 12/25/2019   Influenza,inj,Quad PF,6+ Mos 02/26/2014, 12/31/2014, 01/06/2016, 11/17/2016   Influenza-Unspecified 01/12/2012, 12/27/2012   PFIZER(Purple Top)SARS-COV-2 Vaccination 05/21/2019, 06/11/2019, 04/01/2020   Pneumococcal Conjugate-13 07/01/2014   Pneumococcal Polysaccharide-23 03/03/2012   Tdap 02/29/2012   Varicella 07/18/2013   Zoster Recombinat (Shingrix) 12/14/2016, 06/22/2017    TDAP status: Up to date  Flu Vaccine status: Due, Education has been provided regarding the importance of this vaccine. Advised may receive this vaccine at local pharmacy or Health Dept. Aware to provide a copy of the vaccination record if obtained from local pharmacy or Health Dept. Verbalized acceptance and understanding.  Pneumococcal vaccine status: Up to date  Covid-19 vaccine status: Information provided on how to obtain vaccines.   Qualifies for Shingles Vaccine? No   Zostavax completed Yes   Shingrix Completed?: Yes  Screening Tests Health Maintenance  Topic Date Due   COVID-19 Vaccine (4 - Booster for Pfizer series) 07/30/2020   INFLUENZA VACCINE  10/27/2020   MAMMOGRAM  04/30/2021   TETANUS/TDAP  02/28/2022   COLONOSCOPY (Pts 45-84yrs Insurance coverage will need to be confirmed)   05/04/2023   DEXA SCAN  Completed   Hepatitis C Screening  Completed   Zoster Vaccines- Shingrix  Completed   HPV VACCINES  Aged Out    Health Maintenance  Health Maintenance Due  Topic Date Due   COVID-19 Vaccine (4 - Booster for [place] series) 07/30/2020   INFLUENZA VACCINE  10/27/2020    Colorectal cancer screening: Type of screening: Colonoscopy. Completed 2020. Repeat every no longer required years  Mammogram status: Completed  . Repeat every year  Bone Density  Declined at this time  Lung Cancer Screening: (Low Dose CT Chest recommended if Age 30-80 years, 30 pack-year currently smoking OR have quit w/in 15years.) does not qualify.   Lung Cancer Screening Referral:   Additional Screening:  Hepatitis C Screening: does not qualify; Completed   Vision Screening: Recommended annual ophthalmology exams for early detection of glaucoma and other disorders of the eye. Is the patient up to date with their annual eye exam?  Yes  Who is the provider or what is the name of the office in which the patient attends annual eye exams? Dr. Sharen Counter If pt is not established with a provider, would they like to be referred to a provider to establish care? No .   Dental Screening: Recommended annual dental exams for proper oral hygiene  Community Resource Referral / Chronic Care Management:  CRR required this visit?  No   CCM required this visit?  No      Plan:     I have personally reviewed and noted the following in the patient's chart:   Medical and social history Use of alcohol, tobacco or illicit drugs  Current medications and supplements including opioid prescriptions.  Functional ability and status Nutritional status Physical activity Advanced directives List of other physicians Hospitalizations, surgeries, and ER visits in previous 12 months Vitals Screenings to include cognitive, depression, and falls Referrals and appointments  In addition, I have reviewed and  discussed with patient certain preventive protocols, quality metrics, and best practice recommendations. A written personalized care plan for preventive services as well as general preventive health recommendations were provided to patient.     Leroy Kennedy, LPN   17/06/812   Nurse Notes:

## 2021-01-09 ENCOUNTER — Other Ambulatory Visit: Payer: Self-pay

## 2021-01-09 ENCOUNTER — Ambulatory Visit (INDEPENDENT_AMBULATORY_CARE_PROVIDER_SITE_OTHER): Payer: Medicare PPO | Admitting: Family Medicine

## 2021-01-09 ENCOUNTER — Encounter: Payer: Self-pay | Admitting: Family Medicine

## 2021-01-09 VITALS — BP 118/74 | HR 68 | Temp 97.3°F | Resp 16 | Ht 64.0 in | Wt 143.6 lb

## 2021-01-09 DIAGNOSIS — Z0000 Encounter for general adult medical examination without abnormal findings: Secondary | ICD-10-CM

## 2021-01-09 DIAGNOSIS — Z23 Encounter for immunization: Secondary | ICD-10-CM

## 2021-01-09 DIAGNOSIS — L03313 Cellulitis of chest wall: Secondary | ICD-10-CM

## 2021-01-09 DIAGNOSIS — E785 Hyperlipidemia, unspecified: Secondary | ICD-10-CM | POA: Diagnosis not present

## 2021-01-09 LAB — HEPATIC FUNCTION PANEL
ALT: 8 U/L (ref 0–35)
AST: 16 U/L (ref 0–37)
Albumin: 3.6 g/dL (ref 3.5–5.2)
Alkaline Phosphatase: 55 U/L (ref 39–117)
Bilirubin, Direct: 0.1 mg/dL (ref 0.0–0.3)
Total Bilirubin: 0.4 mg/dL (ref 0.2–1.2)
Total Protein: 6.8 g/dL (ref 6.0–8.3)

## 2021-01-09 LAB — LIPID PANEL
Cholesterol: 147 mg/dL (ref 0–200)
HDL: 50.8 mg/dL (ref >39.00)
LDL Cholesterol: 65 mg/dL (ref 0–99)
NonHDL: 96.45
Total CHOL/HDL Ratio: 3
Triglycerides: 156 mg/dL — ABNORMAL HIGH (ref 0.0–149.0)
VLDL: 31.2 mg/dL (ref 0.0–40.0)

## 2021-01-09 LAB — BASIC METABOLIC PANEL
BUN: 28 mg/dL — ABNORMAL HIGH (ref 6–23)
CO2: 26 mEq/L (ref 19–32)
Calcium: 9.1 mg/dL (ref 8.4–10.5)
Chloride: 104 mEq/L (ref 96–112)
Creatinine, Ser: 0.86 mg/dL (ref 0.40–1.20)
GFR: 65.84 mL/min (ref >60.00)
Glucose, Bld: 82 mg/dL (ref 70–99)
Potassium: 3.9 mEq/L (ref 3.5–5.1)
Sodium: 138 mEq/L (ref 135–145)

## 2021-01-09 LAB — CBC WITH DIFFERENTIAL/PLATELET
Basophils Absolute: 0 10*3/uL (ref 0.0–0.1)
Basophils Relative: 0.4 % (ref 0.0–3.0)
Eosinophils Absolute: 0.2 10*3/uL (ref 0.0–0.7)
Eosinophils Relative: 2.3 % (ref 0.0–5.0)
HCT: 35.3 % — ABNORMAL LOW (ref 36.0–46.0)
Hemoglobin: 11.6 g/dL — ABNORMAL LOW (ref 12.0–15.0)
Lymphocytes Relative: 22.4 % (ref 12.0–46.0)
Lymphs Abs: 1.5 10*3/uL (ref 0.7–4.0)
MCHC: 33 g/dL (ref 30.0–36.0)
MCV: 91.1 fl (ref 78.0–100.0)
Monocytes Absolute: 0.5 10*3/uL (ref 0.1–1.0)
Monocytes Relative: 7.9 % (ref 3.0–12.0)
Neutro Abs: 4.6 10*3/uL (ref 1.4–7.7)
Neutrophils Relative %: 67 % (ref 43.0–77.0)
Platelets: 191 10*3/uL (ref 150.0–400.0)
RBC: 3.87 Mil/uL (ref 3.87–5.11)
RDW: 13.4 % (ref 11.5–15.5)
WBC: 6.8 10*3/uL (ref 4.0–10.5)

## 2021-01-09 LAB — TSH: TSH: 2.11 u[IU]/mL (ref 0.35–5.50)

## 2021-01-09 MED ORDER — CEPHALEXIN 500 MG PO CAPS: 500.0000 mg | ORAL_CAPSULE | Freq: Two times a day (BID) | ORAL | 0 refills | Status: AC

## 2021-01-09 NOTE — Progress Notes (Signed)
   Subjective:    Patient ID: Angel Waller, female    DOB: 09-06-1945, 75 y.o.   MRN: [id number]  HPI CPE- UTD on mammo, colonoscopy.  UTD on PNA vaccines, Tdap.  Due for flu.  Patient Care Team    Relationship Specialty Notifications Start End  Midge Minium, MD PCP - General Family Medicine  08/03/13   Thompson Grayer, MD Consulting Physician Cardiology  07/01/14   Inda Castle, MD (Inactive) Consulting Physician Gastroenterology  07/01/14   Pleasant, Robley Fries., MD Referring Physician Obstetrics and Gynecology  07/03/15   Mackie Pai, MD Referring Physician Cardiology  11/17/16   Martinique, Amy, [place] Physician Dermatology  11/17/16   Paralee Cancel, MD Consulting Physician Orthopedic Surgery  12/14/17     Health Maintenance  Topic Date Due   INFLUENZA VACCINE  10/27/2020   COVID-19 Vaccine (4 - Booster for [place] series) 01/25/2021 (Originally 07/30/2020)   MAMMOGRAM  04/30/2021   TETANUS/TDAP  02/28/2022   COLONOSCOPY (Pts 45-54yrs Insurance coverage will need to be confirmed)  05/04/2023   DEXA SCAN  Completed   Hepatitis C Screening  Completed   Zoster Vaccines- Shingrix  Completed   HPV VACCINES  Aged Out      Review of Systems Patient reports no vision/ hearing changes, adenopathy,fever, weight change,  persistant/recurrent hoarseness , swallowing issues, chest pain, palpitations, edema, persistant/recurrent cough, hemoptysis, dyspnea (rest/exertional/paroxysmal nocturnal), gastrointestinal bleeding (melena, rectal bleeding), abdominal pain, significant heartburn, bowel changes, GU symptoms (dysuria, hematuria, incontinence), Gyn symptoms (abnormal  bleeding, pain),  syncope, focal weakness, memory loss, numbness & tingling, skin/hair/nail changes, abnormal bruising or bleeding, anxiety, or depression.   This visit occurred during the SARS-CoV-2 public health emergency.  Safety protocols were in place, including screening questions prior to the visit,  additional usage of staff PPE, and extensive cleaning of exam room while observing appropriate contact time as indicated for disinfecting solutions.      Objective:   Physical Exam General Appearance:    Alert, cooperative, no distress, appears stated age  Head:    Normocephalic, without obvious abnormality, atraumatic  Eyes:    PERRL, conjunctiva/corneas clear, EOM's intact, fundi    benign, both eyes  Ears:    Normal TM's and external ear canals, both ears  Nose:   Deferred due to COVID  Throat:   Neck:   Supple, symmetrical, trachea midline, no adenopathy;    Thyroid: no enlargement/tenderness/nodules  Back:     Symmetric, no curvature, ROM normal, no CVA tenderness  Lungs:     Clear to auscultation bilaterally, respirations unlabored  Chest Wall:    No tenderness or deformity   Heart:    Regular rate and rhythm, S1 and S2 normal  Breast Exam:    Deferred to mammo  Abdomen:     Soft, non-tender, bowel sounds active all four quadrants,    no masses, no organomegaly  Genitalia:    Deferred  Rectal:    Extremities:   Extremities normal, atraumatic, no cyanosis or edema  Pulses:   2+ and symmetric all extremities  Skin:   Skin color, texture, turgor normal, no rashes or lesions  Lymph nodes:   Cervical, supraclavicular, and axillary nodes normal  Neurologic:   CNII-XII intact, normal strength, sensation and reflexes    throughout          Assessment & Plan:

## 2021-01-09 NOTE — Patient Instructions (Addendum)
Follow up in 6 months to recheck BP and cholesterol We'll notify you of your lab results and make any changes if needed START the Cephalexin twice daily to prevent infection of that cyst Call with any questions or concerns Stay Safe!  Stay Healthy! Happy Fall!!!

## 2021-01-20 DIAGNOSIS — Z419 Encounter for procedure for purposes other than remedying health state, unspecified: Secondary | ICD-10-CM | POA: Diagnosis not present

## 2021-01-20 DIAGNOSIS — L02213 Cutaneous abscess of chest wall: Secondary | ICD-10-CM | POA: Diagnosis not present

## 2021-01-20 DIAGNOSIS — L98499 Non-pressure chronic ulcer of skin of other sites with unspecified severity: Secondary | ICD-10-CM | POA: Diagnosis not present

## 2021-01-24 NOTE — Assessment & Plan Note (Signed)
Pt's PE unchanged from previous.  UTD on mammo, colonoscopy.  UTD on PNA and Tdap.  Flu given today.  Check labs.  Anticipatory guidance provided.

## 2021-01-24 NOTE — Assessment & Plan Note (Signed)
Chronic problem.  Tolerating statin w/o difficulty.  Check labs.  Adjust meds prn  

## 2021-02-03 DIAGNOSIS — Z950 Presence of cardiac pacemaker: Secondary | ICD-10-CM | POA: Diagnosis not present

## 2021-02-03 DIAGNOSIS — T148XXA Other injury of unspecified body region, initial encounter: Secondary | ICD-10-CM | POA: Diagnosis not present

## 2021-02-03 DIAGNOSIS — Z952 Presence of prosthetic heart valve: Secondary | ICD-10-CM | POA: Diagnosis not present

## 2021-03-12 DIAGNOSIS — Z5189 Encounter for other specified aftercare: Secondary | ICD-10-CM | POA: Diagnosis not present

## 2021-03-12 DIAGNOSIS — I442 Atrioventricular block, complete: Secondary | ICD-10-CM | POA: Diagnosis not present

## 2021-03-12 DIAGNOSIS — Z953 Presence of xenogenic heart valve: Secondary | ICD-10-CM | POA: Diagnosis not present

## 2021-03-12 DIAGNOSIS — Z950 Presence of cardiac pacemaker: Secondary | ICD-10-CM | POA: Diagnosis not present

## 2021-03-12 DIAGNOSIS — E782 Mixed hyperlipidemia: Secondary | ICD-10-CM | POA: Diagnosis not present

## 2021-03-27 DIAGNOSIS — Z1231 Encounter for screening mammogram for malignant neoplasm of breast: Secondary | ICD-10-CM | POA: Diagnosis not present

## 2021-03-27 DIAGNOSIS — Z01419 Encounter for gynecological examination (general) (routine) without abnormal findings: Secondary | ICD-10-CM | POA: Diagnosis not present

## 2021-03-27 LAB — HM MAMMOGRAPHY

## 2021-05-07 DIAGNOSIS — Z950 Presence of cardiac pacemaker: Secondary | ICD-10-CM | POA: Diagnosis not present

## 2021-05-08 DIAGNOSIS — Z950 Presence of cardiac pacemaker: Secondary | ICD-10-CM | POA: Diagnosis not present

## 2021-05-12 ENCOUNTER — Encounter: Payer: Self-pay | Admitting: Family Medicine

## 2021-05-12 MED ORDER — FLUCONAZOLE 150 MG PO TABS: 150.0000 mg | ORAL_TABLET | Freq: Once | ORAL | 0 refills | Status: AC

## 2021-07-10 ENCOUNTER — Ambulatory Visit: Payer: Medicare PPO | Admitting: Family Medicine

## 2021-07-13 DIAGNOSIS — H5203 Hypermetropia, bilateral: Secondary | ICD-10-CM | POA: Diagnosis not present

## 2021-07-13 DIAGNOSIS — H524 Presbyopia: Secondary | ICD-10-CM | POA: Diagnosis not present

## 2021-07-13 DIAGNOSIS — H52223 Regular astigmatism, bilateral: Secondary | ICD-10-CM | POA: Diagnosis not present

## 2021-07-23 ENCOUNTER — Encounter: Payer: Self-pay | Admitting: Family Medicine

## 2021-07-23 ENCOUNTER — Ambulatory Visit: Payer: Medicare PPO | Admitting: Family Medicine

## 2021-07-23 VITALS — BP 120/76 | HR 60 | Temp 97.3°F | Resp 16 | Ht 64.0 in | Wt 142.0 lb

## 2021-07-23 DIAGNOSIS — N760 Acute vaginitis: Secondary | ICD-10-CM | POA: Diagnosis not present

## 2021-07-23 DIAGNOSIS — I5022 Chronic systolic (congestive) heart failure: Secondary | ICD-10-CM

## 2021-07-23 DIAGNOSIS — E785 Hyperlipidemia, unspecified: Secondary | ICD-10-CM

## 2021-07-23 LAB — CBC WITH DIFFERENTIAL/PLATELET
Basophils Absolute: 0 10*3/uL (ref 0.0–0.1)
Basophils Relative: 0.7 % (ref 0.0–3.0)
Eosinophils Absolute: 0.1 10*3/uL (ref 0.0–0.7)
Eosinophils Relative: 2.3 % (ref 0.0–5.0)
HCT: 35.2 % — ABNORMAL LOW (ref 36.0–46.0)
Hemoglobin: 11.8 g/dL — ABNORMAL LOW (ref 12.0–15.0)
Lymphocytes Relative: 24.9 % (ref 12.0–46.0)
Lymphs Abs: 1.6 10*3/uL (ref 0.7–4.0)
MCHC: 33.7 g/dL (ref 30.0–36.0)
MCV: 92.6 fl (ref 78.0–100.0)
Monocytes Absolute: 0.4 10*3/uL (ref 0.1–1.0)
Monocytes Relative: 7 % (ref 3.0–12.0)
Neutro Abs: 4.1 10*3/uL (ref 1.4–7.7)
Neutrophils Relative %: 65.1 % (ref 43.0–77.0)
Platelets: 177 10*3/uL (ref 150.0–400.0)
RBC: 3.8 Mil/uL — ABNORMAL LOW (ref 3.87–5.11)
RDW: 13.4 % (ref 11.5–15.5)
WBC: 6.4 10*3/uL (ref 4.0–10.5)

## 2021-07-23 LAB — LIPID PANEL
Cholesterol: 142 mg/dL (ref 0–200)
HDL: 52 mg/dL (ref >39.00)
LDL Cholesterol: 62 mg/dL (ref 0–99)
NonHDL: 90.21
Total CHOL/HDL Ratio: 3
Triglycerides: 141 mg/dL (ref 0.0–149.0)
VLDL: 28.2 mg/dL (ref 0.0–40.0)

## 2021-07-23 LAB — BASIC METABOLIC PANEL
BUN: 26 mg/dL — ABNORMAL HIGH (ref 6–23)
CO2: 28 mEq/L (ref 19–32)
Calcium: 9.2 mg/dL (ref 8.4–10.5)
Chloride: 101 mEq/L (ref 96–112)
Creatinine, Ser: 0.86 mg/dL (ref 0.40–1.20)
GFR: 65.59 mL/min (ref >60.00)
Glucose, Bld: 83 mg/dL (ref 70–99)
Potassium: 4.1 mEq/L (ref 3.5–5.1)
Sodium: 136 mEq/L (ref 135–145)

## 2021-07-23 LAB — HEPATIC FUNCTION PANEL
ALT: 9 U/L (ref 0–35)
AST: 18 U/L (ref 0–37)
Albumin: 3.7 g/dL (ref 3.5–5.2)
Alkaline Phosphatase: 60 U/L (ref 39–117)
Bilirubin, Direct: 0.1 mg/dL (ref 0.0–0.3)
Total Bilirubin: 0.4 mg/dL (ref 0.2–1.2)
Total Protein: 6.9 g/dL (ref 6.0–8.3)

## 2021-07-23 LAB — TSH: TSH: 2.38 u[IU]/mL (ref 0.35–5.50)

## 2021-07-23 MED ORDER — FLUCONAZOLE 150 MG PO TABS: 150.0000 mg | ORAL_TABLET | Freq: Once | ORAL | 0 refills | Status: AC

## 2021-07-23 NOTE — Patient Instructions (Signed)
Schedule your complete physical in 6 months ?We'll notify you of your lab results and make any changes if needed ?Keep up the good work on healthy diet and regular exercise- you look great!!! ?Take the Diflucan ?Call with any questions or concerns ?Stay Safe!!  Stay Healthy! ?Happy Spring!!! ?

## 2021-07-23 NOTE — Assessment & Plan Note (Signed)
Chronic problem.  Currently asymptomatic.  No volume overload.  Tolerating Losartan '25mg'$  daily and Torsemide '10mg'$  daily.  Check labs due to ARB use and diuretic.  No anticipated med changes. ?

## 2021-07-23 NOTE — Progress Notes (Signed)
? ?  Subjective:  ? ? Patient ID: Angel Waller, female    DOB: 04-30-1945, 76 y.o.   MRN: [id number] ? ?HPI ?CHF- chronic problem, on Losartan '25mg'$  daily, Torsemide '10mg'$  daily.  No CP, SOB, HAs, visual changes, edema. ? ?Hyperlipidemia- chronic problem, on Lipitor '40mg'$  daily.  Denies abd pain, N/V ? ?Vaginitis- pt recently had a yeast infxn.  Took Diflucan w/ improvement but not resolution.  Continues to have d/c and itching. ? ? ?Review of Systems ?For ROS see HPI  ?   ?Objective:  ? Physical Exam ?Vitals reviewed.  ?Constitutional:   ?   General: She is not in acute distress. ?   Appearance: Normal appearance. She is well-developed. She is not ill-appearing.  ?HENT:  ?   Head: Normocephalic and atraumatic.  ?Eyes:  ?   Conjunctiva/sclera: Conjunctivae normal.  ?   Pupils: Pupils are equal, round, and reactive to light.  ?Neck:  ?   Thyroid: No thyromegaly.  ?Cardiovascular:  ?   Rate and Rhythm: Normal rate and regular rhythm.  ?   Pulses: Normal pulses.  ?   Heart sounds: Normal heart sounds. No murmur heard. ?Pulmonary:  ?   Effort: Pulmonary effort is normal. No respiratory distress.  ?   Breath sounds: Normal breath sounds.  ?Abdominal:  ?   General: There is no distension.  ?   Palpations: Abdomen is soft.  ?   Tenderness: There is no abdominal tenderness.  ?Musculoskeletal:  ?   Cervical back: Normal range of motion and neck supple.  ?   Right lower leg: No edema.  ?   Left lower leg: No edema.  ?Lymphadenopathy:  ?   Cervical: No cervical adenopathy.  ?Skin: ?   General: Skin is warm and dry.  ?Neurological:  ?   Mental Status: She is alert and oriented to person, place, and time.  ?Psychiatric:     ?   Behavior: Behavior normal.  ? ? ? ? ? ?   ?Assessment & Plan:  ? ?Vaginitis- pt recently had yeast infxn and continues to have sxs.  Will provide 2nd Diflucan.  Pt expressed understanding and is in agreement w/ plan.  ?

## 2021-07-23 NOTE — Assessment & Plan Note (Signed)
Chronic problem.  Tolerating Lipitor '40mg'$  w/o difficulty.  Check labs.  Adjust meds prn  ?

## 2021-08-06 DIAGNOSIS — Z950 Presence of cardiac pacemaker: Secondary | ICD-10-CM | POA: Diagnosis not present

## 2021-09-01 DIAGNOSIS — Z950 Presence of cardiac pacemaker: Secondary | ICD-10-CM | POA: Diagnosis not present

## 2021-09-01 DIAGNOSIS — Z8679 Personal history of other diseases of the circulatory system: Secondary | ICD-10-CM | POA: Diagnosis not present

## 2021-09-01 DIAGNOSIS — Z953 Presence of xenogenic heart valve: Secondary | ICD-10-CM | POA: Diagnosis not present

## 2021-09-01 DIAGNOSIS — I2510 Atherosclerotic heart disease of native coronary artery without angina pectoris: Secondary | ICD-10-CM | POA: Insufficient documentation

## 2021-09-01 DIAGNOSIS — E782 Mixed hyperlipidemia: Secondary | ICD-10-CM | POA: Diagnosis not present

## 2021-09-15 DIAGNOSIS — L821 Other seborrheic keratosis: Secondary | ICD-10-CM | POA: Diagnosis not present

## 2021-09-15 DIAGNOSIS — D225 Melanocytic nevi of trunk: Secondary | ICD-10-CM | POA: Diagnosis not present

## 2021-09-15 DIAGNOSIS — L814 Other melanin hyperpigmentation: Secondary | ICD-10-CM | POA: Diagnosis not present

## 2021-10-13 DIAGNOSIS — H353131 Nonexudative age-related macular degeneration, bilateral, early dry stage: Secondary | ICD-10-CM | POA: Diagnosis not present

## 2021-10-13 DIAGNOSIS — H25013 Cortical age-related cataract, bilateral: Secondary | ICD-10-CM | POA: Diagnosis not present

## 2021-10-13 DIAGNOSIS — H02831 Dermatochalasis of right upper eyelid: Secondary | ICD-10-CM | POA: Diagnosis not present

## 2021-10-13 DIAGNOSIS — H02834 Dermatochalasis of left upper eyelid: Secondary | ICD-10-CM | POA: Diagnosis not present

## 2021-11-04 DIAGNOSIS — Z950 Presence of cardiac pacemaker: Secondary | ICD-10-CM | POA: Diagnosis not present

## 2021-11-05 DIAGNOSIS — Z950 Presence of cardiac pacemaker: Secondary | ICD-10-CM | POA: Diagnosis not present

## 2021-12-30 ENCOUNTER — Ambulatory Visit (INDEPENDENT_AMBULATORY_CARE_PROVIDER_SITE_OTHER): Payer: Medicare PPO

## 2021-12-30 VITALS — Wt 142.0 lb

## 2021-12-30 DIAGNOSIS — Z0000 Encounter for general adult medical examination without abnormal findings: Secondary | ICD-10-CM

## 2021-12-30 NOTE — Progress Notes (Signed)
Virtual Visit via Telephone Note  I connected with  Angel Waller on 12/30/21 at 11:00 AM EDT by telephone and verified that I am speaking with the correct person using two identifiers.  Medicare Annual Wellness visit completed telephonically due to Covid-19 pandemic.   Persons participating in this call: This Health Coach and this patient.   Location: Patient: home Provider: office   I discussed the limitations, risks, security and privacy concerns of performing an evaluation and management service by telephone and the availability of in person appointments. The patient expressed understanding and agreed to proceed.  Unable to perform video visit due to video visit attempted and failed and/or patient does not have video capability.   Some vital signs may be absent or patient reported.   Willette Brace, LPN   Subjective:   Angel Waller is a 76 y.o. female who presents for Medicare Annual (Subsequent) preventive examination.  Review of Systems     Cardiac Risk Factors include: advanced age (>75mn, >>42women);dyslipidemia     Objective:    Today's Vitals   12/30/21 1049  Weight: 142 lb (64.4 kg)   Body mass index is 24.37 kg/m.     12/30/2021   10:54 AM 12/29/2020    1:09 PM 12/24/2019   12:50 PM 11/10/2018    3:40 AM 11/09/2018    9:52 PM 12/14/2017    8:23 AM 04/17/2017    8:37 PM  Advanced Directives  Does Patient Have a Medical Advance Directive? Yes Yes Yes Yes No Yes No  Type of AParamed[place]of [place] will Healthcare Power of [place] will  Living will;Healthcare Power of Attorney   Does patient want to make changes to medical advance directive?  No - Patient declined  No - Patient declined     Copy of [place] Chart? No - copy requested Yes - validated most recent copy scanned in chart (See row information) No - copy requested No -  copy requested  No - copy requested   Would patient like information on creating a medical advance directive?    No - Patient declined   No - Patient declined    Current Medications (verified) Outpatient Encounter Medications as of 12/30/2021  Medication Sig   aspirin 81 MG EC tablet Take 1 tablet by mouth daily.   atorvastatin (LIPITOR) 40 MG tablet TAKE 1 TABLET BY MOUTH DAILY   Cholecalciferol 25 MCG (1000 UT) capsule Take 2,000 Units by mouth daily.    estradiol (ESTRACE) 2 MG tablet Take 2 mg by mouth daily.    losartan (COZAAR) 25 MG tablet Take 25 mg by mouth daily.    Multiple Vitamins-Minerals (PRESERVISION AREDS 2 PO) Take by mouth daily.   omega-3 acid ethyl esters (LOVAZA) 1 g capsule    torsemide (DEMADEX) 10 MG tablet Take 10 mg by mouth daily.    tretinoin microspheres (RETIN-A MICRO) 0.1 % gel Apply at bedtime as directed   clindamycin (CLEOCIN) 300 MG capsule For dental appt   senna (SENOKOT) 8.6 MG TABS tablet Take 2 tablets by mouth 2 (two) times daily as needed for mild constipation. (Patient not taking: Reported on 12/30/2021)   No facility-administered encounter medications on file as of 12/30/2021.    Allergies (verified) Amoxicillin   History: Past Medical History:  Diagnosis Date   Allergy    Aortic stenosis    Arthritis    "right knee" (  02/29/2012)   Bicuspid aortic valve 09/2008   STATUS POST BIOPROSTHETIC AORTIC VALVE REPLACEMENT   Blood transfusion without reported diagnosis    Complete heart block ([place])    Heart murmur    Hyperlipemia    Migraines    "occasionally" (02/29/2012)   Osteopenia    Pacemaker    S/P AVR (aortic valve replacement)    July 2010   Seasonal allergies    Past Surgical History:  Procedure Laterality Date   ABDOMINAL HYSTERECTOMY     CARDIAC VALVE REPLACEMENT  2010   tissue aortic valve; [place]   COLONOSCOPY  2005   negative; [place], [place]   INSERT / REPLACE / REMOVE PACEMAKER     KNEE ARTHROSCOPY Right    PERMANENT  PACEMAKER INSERTION  03/02/2012   Medtronic Adapta L implanted by Dr Rayann Heman   PERMANENT PACEMAKER INSERTION N/A 03/02/2012   Procedure: PERMANENT PACEMAKER INSERTION;  Surgeon: Thompson Grayer, MD;  Location: [facility] CATH LAB;  Service: Cardiovascular;  Laterality: N/A;   POLYPECTOMY     TEMPORARY PACEMAKER INSERTION N/A 03/01/2012   Procedure: TEMPORARY PACEMAKER INSERTION;  Surgeon: Burnell Blanks, MD;  Location: [facility] CATH LAB;  Service: Cardiovascular;  Laterality: N/A;   TONSILLECTOMY AND ADENOIDECTOMY  1962   TOTAL KNEE ARTHROPLASTY Right 01/07/2014   Procedure: RIGHT TOTAL KNEE ARTHROPLASTY;  Surgeon: Mauri Pole, MD;  Location: [facility] ORS;  Service: Orthopedics;  Laterality: Right;   Family History  Problem Relation Age of Onset   Heart attack Mother 73       CABG   Heart attack Father 51       aneurysm post MI   Colon polyps Father    Heart disease Other        in both M & P uncles & aunts; no premature MI   Diabetes Neg Hx    Stroke Neg Hx    Colon cancer Neg Hx    Pancreatic cancer Neg Hx    Rectal cancer Neg Hx    Stomach cancer Neg Hx    Social History   Socioeconomic History   Marital status: Married    Spouse name: Not on file   Number of children: Not on file   Years of education: Not on file   Highest education level: Not on file  Occupational History   Occupation: retired  Tobacco Use   Smoking status: Former    Packs/day: 0.50    Years: 42.00    Total pack years: 21.00    Types: Cigarettes    Quit date: 03/30/2007    Years since quitting: 14.7   Smokeless tobacco: Never   Tobacco comments:    smoked ages [age], up to 1 pp WEEK  Vaping Use   Vaping Use: Never used  Substance and Sexual Activity   Alcohol use: No   Drug use: No   Sexual activity: Yes  Other Topics Concern   Not on file  Social History Narrative   Not on file   Social Determinants of Health   Financial Resource Strain: Low Risk  (12/30/2021)   Overall Financial Resource Strain  (CARDIA)    Difficulty of Paying Living Expenses: Not hard at all  Food Insecurity: No Food Insecurity (12/30/2021)   Hunger Vital Sign    Worried About Running Out of Food in the Last Year: Never true    Ran Out of Food in the Last Year: Never true  Transportation Needs: No Transportation Needs (12/30/2021)   PRAPARE -  Hydrologist (Medical): No    Lack of Transportation (Non-Medical): No  Physical Activity: Insufficiently Active (12/30/2021)   Exercise Vital Sign    Days of Exercise per Week: 6 days    Minutes of Exercise per Session: 10 min  Stress: No Stress Concern Present (12/30/2021)   [place]    Feeling of Stress : Not at all  Social Connections: [place] (12/30/2021)   Social Connection and Isolation Panel [NHANES]    Frequency of Communication with Friends and Family: More than three times a week    Frequency of Social Gatherings with Friends and Family: More than three times a week    Attends Religious Services: 1 to 4 times per year    Active Member of [employer] or Organizations: Yes    Attends Archivist Meetings: 1 to 4 times per year    Marital Status: Married    Tobacco Counseling Counseling given: Not Answered Tobacco comments: smoked ages [age], up to 1 pp WEEK   Clinical Intake:  Pre-visit preparation completed: Yes  Pain : No/denies pain     BMI - recorded: 24.37 Nutritional Status: BMI of 19-24  Normal Nutritional Risks: None Diabetes: No  How often do you need to have someone help you when you read instructions, pamphlets, or other written materials from your doctor or pharmacy?: 1 - Never  Diabetic?no  Interpreter Needed?: No  Information entered by :: Angel Rakes, LPN   Activities of Daily Living    12/30/2021   10:56 AM 01/09/2021    8:56 AM  In your present state of health, do you have any difficulty performing the  following activities:  Hearing? 0 0  Vision? 0 0  Difficulty concentrating or making decisions? 0 0  Walking or climbing stairs? 0 0  Dressing or bathing? 0 0  Doing errands, shopping? 0 0  Preparing Food and eating ? N   Using the Toilet? N   In the past six months, have you accidently leaked urine? Y   Comment wears a liner for just in case   Do you have problems with loss of bowel control? N   Managing your Medications? N   Managing your Finances? N   Housekeeping or managing your Housekeeping? N     Patient Care Team: Midge Minium, MD as PCP - General (Family Medicine) Thompson Grayer, MD as Consulting Physician (Cardiology) Inda Castle, MD (Inactive) as Consulting Physician (Gastroenterology) Pleasant, Robley Fries., MD as Referring Physician (Obstetrics and Gynecology) Mackie Pai, MD as Referring Physician (Cardiology) Martinique, Amy, MD as Consulting Physician (Dermatology) Paralee Cancel, MD as Consulting Physician (Orthopedic Surgery)  Indicate any recent Medical Services you may have received from other than [facility] providers in the past year (date may be approximate).     Assessment:   This is a routine wellness examination for Angel Waller.  Hearing/Vision screen Hearing Screening - Comments:: Pt denies any hearing issues  Vision Screening - Comments:: Pt follows up with Dr Sharen Counter for annual eye exams   Dietary issues and exercise activities discussed: Current Exercise Habits: Home exercise routine, Type of exercise: stretching, Time (Minutes): 10, Frequency (Times/Week): 6, Weekly Exercise (Minutes/Week): 60   Goals Addressed             This Visit's Progress    Patient Stated       Lose 10 lbs and start walking more  Depression Screen    12/30/2021   10:53 AM 07/23/2021    9:14 AM 01/09/2021    8:57 AM 12/29/2020    1:19 PM 07/10/2020   11:04 AM 12/25/2019   10:06 AM 12/24/2019   12:51 PM  PHQ 2/9 Scores  PHQ - 2 Score 0 0 0 0 0  0 0  PHQ- 9 Score  1 0  0 0     Fall Risk    12/30/2021   10:55 AM 07/23/2021    9:14 AM 01/09/2021    8:57 AM 12/29/2020    1:21 PM 07/10/2020   11:04 AM  Fall Risk   Falls in the past year? 0 0 0 0 0  Number falls in past yr: 0 0  0 0  Injury with Fall? 0 0  0 0  Risk for fall due to : Impaired vision No Fall Risks No Fall Risks  No Fall Risks  Follow up Falls prevention discussed Falls evaluation completed Falls evaluation completed Falls evaluation completed;Falls prevention discussed     FALL RISK PREVENTION PERTAINING TO THE HOME:  Any stairs in or around the home? Yes  If so, are there any without handrails? No  Home free of loose throw rugs in walkways, pet beds, electrical cords, etc? Yes  Adequate lighting in your home to reduce risk of falls? Yes   ASSISTIVE DEVICES UTILIZED TO PREVENT FALLS:  Life alert? No  Use of a cane, walker or w/c? No  Grab bars in the bathroom? Yes  Shower chair or bench in shower? Yes  Elevated toilet seat or a handicapped toilet? No   TIMED UP AND GO:  Was the test performed? No .   Cognitive Function:    12/14/2017    8:25 AM  MMSE - Mini Mental State Exam  Orientation to time 5  Orientation to Place 5  Registration 3  Attention/ Calculation 5  Recall 3  Language- name 2 objects 2  Language- repeat 1  Language- follow 3 step command 3  Language- read & follow direction 1  Write a sentence 1  Copy design 1  Total score 30        12/30/2021   10:57 AM  6CIT Screen  What Year? 0 points  What month? 0 points  What time? 0 points  Count back from 20 0 points  Months in reverse 0 points  Repeat phrase 0 points  Total Score 0 points    Immunizations Immunization History  Administered Date(s) Administered   Fluad Quad(high Dose 65+) 12/20/2018, 12/25/2019, 01/09/2021   Influenza Split 03/02/2011   Influenza Whole 02/24/2010   Influenza, High Dose Seasonal PF 12/14/2017, 12/25/2019   Influenza,inj,Quad PF,6+ Mos  02/26/2014, 12/31/2014, 01/06/2016, 11/17/2016   Influenza-Unspecified 01/12/2012, 12/27/2012   PFIZER(Purple Top)SARS-COV-2 Vaccination 05/21/2019, 06/11/2019, 04/01/2020   Pneumococcal Conjugate-13 07/01/2014   Pneumococcal Polysaccharide-23 03/03/2012   Tdap 02/29/2012   Varicella 07/18/2013   Zoster Recombinat (Shingrix) 12/14/2016, 06/22/2017    TDAP status: Due, Education has been provided regarding the importance of this vaccine. Advised may receive this vaccine at local pharmacy or Health Dept. Aware to provide a copy of the vaccination record if obtained from local pharmacy or Health Dept. Verbalized acceptance and understanding.  Flu Vaccine status: Due, Education has been provided regarding the importance of this vaccine. Advised may receive this vaccine at local pharmacy or Health Dept. Aware to provide a copy of the vaccination record if obtained from local pharmacy or Health Dept. Verbalized  acceptance and understanding.  Pneumococcal vaccine status: Up to date  Covid-19 vaccine status: Completed vaccines  Qualifies for Shingles Vaccine? Yes   Zostavax completed Yes   Shingrix Completed?: Yes  Screening Tests Health Maintenance  Topic Date Due   COVID-19 Vaccine (4 - Pfizer series) 05/27/2020   INFLUENZA VACCINE  10/27/2021   TETANUS/TDAP  02/28/2022   MAMMOGRAM  03/27/2022   COLONOSCOPY (Pts 45-79yr Insurance coverage will need to be confirmed)  05/04/2023   Pneumonia Vaccine 77 Years old  Completed   DEXA SCAN  Completed   Hepatitis C Screening  Completed   Zoster Vaccines- Shingrix  Completed   HPV VACCINES  Aged Out    Health Maintenance  Health Maintenance Due  Topic Date Due   COVID-19 Vaccine (4 - Pfizer series) 05/27/2020   INFLUENZA VACCINE  10/27/2021    Colorectal cancer screening: Type of screening: Colonoscopy. Completed 05/03/18. Repeat every 5 years  Mammogram status: Completed 03/27/21. Repeat every year  Bone density  07/22/11   Additional Screening:  Hepatitis C Screening:  Completed 11/17/16  Vision Screening: Recommended annual ophthalmology exams for early detection of glaucoma and other disorders of the eye. Is the patient up to date with their annual eye exam?  Yes  Who is the provider or what is the name of the office in which the patient attends annual eye exams? Dr PSharen CounterIf pt is not established with a provider, would they like to be referred to a provider to establish care? No .   Dental Screening: Recommended annual dental exams for proper oral hygiene  Community Resource Referral / Chronic Care Management: CRR required this visit?  No   CCM required this visit?  No      Plan:     I have personally reviewed and noted the following in the patient's chart:   Medical and social history Use of alcohol, tobacco or illicit drugs  Current medications and supplements including opioid prescriptions. Patient is not currently taking opioid prescriptions. Functional ability and status Nutritional status Physical activity Advanced directives List of other physicians Hospitalizations, surgeries, and ER visits in previous 12 months Vitals Screenings to include cognitive, depression, and falls Referrals and appointments  In addition, I have reviewed and discussed with patient certain preventive protocols, quality metrics, and best practice recommendations. A written personalized care plan for preventive services as well as general preventive health recommendations were provided to patient.     TWillette Brace LPN   [id number]/06/[id number]  Nurse Notes: none

## 2021-12-30 NOTE — Patient Instructions (Signed)
Angel Waller , Thank you for taking time to come for your Medicare Wellness Visit. I appreciate your ongoing commitment to your health goals. Please review the following plan we discussed and let me know if I can assist you in the future.   These are the goals we discussed:  Goals      patient      Maintain current health by staying active.      Patient Stated     Increase physical activity     Patient Stated     Lose 10 lbs and start walking more         This is a list of the screening recommended for you and due dates:  Health Maintenance  Topic Date Due   COVID-19 Vaccine (4 - Pfizer series) 05/27/2020   Flu Shot  10/27/2021   Tetanus Vaccine  02/28/2022   Mammogram  03/27/2022   Colon Cancer Screening  05/04/2023   Pneumonia Vaccine  Completed   DEXA scan (bone density measurement)  Completed   Hepatitis C Screening: USPSTF Recommendation to screen - Ages 6-79 yo.  Completed   Zoster (Shingles) Vaccine  Completed   HPV Vaccine  Aged Out    Advanced directives: Please bring a copy of your health care power of attorney and living will to the office at your convenience.  Conditions/risks identified: lose 10 lbs and walk more   Next appointment: Follow up in one year for your annual wellness visit    Preventive Care 65 Years and Older, Female Preventive care refers to lifestyle choices and visits with your health care provider that can promote health and wellness. What does preventive care include? A yearly physical exam. This is also called an annual well check. Dental exams once or twice a year. Routine eye exams. Ask your health care provider how often you should have your eyes checked. Personal lifestyle choices, including: Daily care of your teeth and gums. Regular physical activity. Eating a healthy diet. Avoiding tobacco and drug use. Limiting alcohol use. Practicing safe sex. Taking low-dose aspirin every day. Taking vitamin and mineral supplements as  recommended by your health care provider. What happens during an annual well check? The services and screenings done by your health care provider during your annual well check will depend on your age, overall health, lifestyle risk factors, and family history of disease. Counseling  Your health care provider may ask you questions about your: Alcohol use. Tobacco use. Drug use. Emotional well-being. Home and relationship well-being. Sexual activity. Eating habits. History of falls. Memory and ability to understand (cognition). Work and work Statistician. Reproductive health. Screening  You may have the following tests or measurements: Height, weight, and BMI. Blood pressure. Lipid and cholesterol levels. These may be checked every 5 years, or more frequently if you are over 52 years old. Skin check. Lung cancer screening. You may have this screening every year starting at age 60 if you have a 30-pack-year history of smoking and currently smoke or have quit within the past 15 years. Fecal occult blood test (FOBT) of the stool. You may have this test every year starting at age 14. Flexible sigmoidoscopy or colonoscopy. You may have a sigmoidoscopy every 5 years or a colonoscopy every 10 years starting at age 68. Hepatitis C blood test. Hepatitis B blood test. Sexually transmitted disease (STD) testing. Diabetes screening. This is done by checking your blood sugar (glucose) after you have not eaten for a while (fasting). You may have this  done every 1-3 years. Bone density scan. This is done to screen for osteoporosis. You may have this done starting at age 21. Mammogram. This may be done every 1-2 years. Talk to your health care provider about how often you should have regular mammograms. Talk with your health care provider about your test results, treatment options, and if necessary, the need for more tests. Vaccines  Your health care provider may recommend certain vaccines, such  as: Influenza vaccine. This is recommended every year. Tetanus, diphtheria, and acellular pertussis (Tdap, Td) vaccine. You may need a Td booster every 10 years. Zoster vaccine. You may need this after age 51. Pneumococcal 13-valent conjugate (PCV13) vaccine. One dose is recommended after age 58. Pneumococcal polysaccharide (PPSV23) vaccine. One dose is recommended after age 50. Talk to your health care provider about which screenings and vaccines you need and how often you need them. This information is not intended to replace advice given to you by your health care provider. Make sure you discuss any questions you have with your health care provider. Document Released: 04/11/2015 Document Revised: 12/03/2015 Document Reviewed: 01/14/2015 Elsevier Interactive Patient Education  2017 [place] Prevention in the Home Falls can cause injuries. They can happen to people of all ages. There are many things you can do to make your home safe and to help prevent falls. What can I do on the outside of my home? Regularly fix the edges of walkways and driveways and fix any cracks. Remove anything that might make you trip as you walk through a door, such as a raised step or threshold. Trim any bushes or trees on the path to your home. Use bright outdoor lighting. Clear any walking paths of anything that might make someone trip, such as rocks or tools. Regularly check to see if handrails are loose or broken. Make sure that both sides of any steps have handrails. Any raised decks and porches should have guardrails on the edges. Have any leaves, snow, or ice cleared regularly. Use sand or salt on walking paths during winter. Clean up any spills in your garage right away. This includes oil or grease spills. What can I do in the bathroom? Use night lights. Install grab bars by the toilet and in the tub and shower. Do not use towel bars as grab bars. Use non-skid mats or decals in the tub or  shower. If you need to sit down in the shower, use a plastic, non-slip stool. Keep the floor dry. Clean up any water that spills on the floor as soon as it happens. Remove soap buildup in the tub or shower regularly. Attach bath mats securely with double-sided non-slip rug tape. Do not have throw rugs and other things on the floor that can make you trip. What can I do in the bedroom? Use night lights. Make sure that you have a light by your bed that is easy to reach. Do not use any sheets or blankets that are too big for your bed. They should not hang down onto the floor. Have a firm chair that has side arms. You can use this for support while you get dressed. Do not have throw rugs and other things on the floor that can make you trip. What can I do in the kitchen? Clean up any spills right away. Avoid walking on wet floors. Keep items that you use a lot in easy-to-reach places. If you need to reach something above you, use a strong step stool that  has a grab bar. Keep electrical cords out of the way. Do not use floor polish or wax that makes floors slippery. If you must use wax, use non-skid floor wax. Do not have throw rugs and other things on the floor that can make you trip. What can I do with my stairs? Do not leave any items on the stairs. Make sure that there are handrails on both sides of the stairs and use them. Fix handrails that are broken or loose. Make sure that handrails are as long as the stairways. Check any carpeting to make sure that it is firmly attached to the stairs. Fix any carpet that is loose or worn. Avoid having throw rugs at the top or bottom of the stairs. If you do have throw rugs, attach them to the floor with carpet tape. Make sure that you have a light switch at the top of the stairs and the bottom of the stairs. If you do not have them, ask someone to add them for you. What else can I do to help prevent falls? Wear shoes that: Do not have high heels. Have  rubber bottoms. Are comfortable and fit you well. Are closed at the toe. Do not wear sandals. If you use a stepladder: Make sure that it is fully opened. Do not climb a closed stepladder. Make sure that both sides of the stepladder are locked into place. Ask someone to hold it for you, if possible. Clearly mark and make sure that you can see: Any grab bars or handrails. First and last steps. Where the edge of each step is. Use tools that help you move around (mobility aids) if they are needed. These include: Canes. Walkers. Scooters. Crutches. Turn on the lights when you go into a dark area. Replace any light bulbs as soon as they burn out. Set up your furniture so you have a clear path. Avoid moving your furniture around. If any of your floors are uneven, fix them. If there are any pets around you, be aware of where they are. Review your medicines with your doctor. Some medicines can make you feel dizzy. This can increase your chance of falling. Ask your doctor what other things that you can do to help prevent falls. This information is not intended to replace advice given to you by your health care provider. Make sure you discuss any questions you have with your health care provider. Document Released: 01/09/2009 Document Revised: 08/21/2015 Document Reviewed: 04/19/2014 Elsevier Interactive Patient Education  2017 [employer].

## 2022-01-19 ENCOUNTER — Telehealth (INDEPENDENT_AMBULATORY_CARE_PROVIDER_SITE_OTHER): Payer: Medicare PPO | Admitting: Family Medicine

## 2022-01-19 ENCOUNTER — Encounter: Payer: Self-pay | Admitting: Family Medicine

## 2022-01-19 VITALS — Temp 97.3°F | Wt 139.0 lb

## 2022-01-19 DIAGNOSIS — U071 COVID-19: Secondary | ICD-10-CM | POA: Diagnosis not present

## 2022-01-19 MED ORDER — NIRMATRELVIR/RITONAVIR (PAXLOVID)TABLET
3.0000 | ORAL_TABLET | Freq: Two times a day (BID) | ORAL | 0 refills | Status: AC
Start: 2022-01-19 — End: 2022-01-24

## 2022-01-19 MED ORDER — BENZONATATE 100 MG PO CAPS: ORAL_CAPSULE | ORAL | 0 refills | Status: DC

## 2022-01-19 NOTE — Patient Instructions (Signed)
HOME CARE TIPS:  -COVID19 testing information: [email or web address]  Most pharmacies also offer testing and home test kits. If the Covid19 test is positive and you desire antiviral treatment, please contact a [facility] or schedule a follow up virtual visit through your primary care office or through the [facility].  Other test to treat options: [email or web address]  -I sent the medication(s) we discussed to your pharmacy: Meds ordered this encounter  Medications   nirmatrelvir/ritonavir EUA (PAXLOVID) 20 x 150 MG & 10 x '100MG'$  TABS    Sig: Take 3 tablets by mouth 2 (two) times daily for 5 days. (Take nirmatrelvir 150 mg two tablets twice daily for 5 days and ritonavir 100 mg one tablet twice daily for 5 days) Patient GFR is > 60 on last check 6 months ago.    Dispense:  30 tablet    Refill:  0   benzonatate (TESSALON PERLES) 100 MG capsule    Sig: 1-2 capsules up to twice daily as needed for cough    Dispense:  30 capsule    Refill:  0     -I sent in the [facility] treatment or referral you requested per our discussion. Please see the information provided below and discuss further with the pharmacist/treatment team.  -If taking Paxlovid, please review all medications, supplement and over the counter drugs with your pharmacist and ask them to check for any interactions. Please make the following changes to your regular medications while taking Paxlovid: *HOLD your atorvastatin (Lipitor) while taking Paxlovid and do not restart until 3 days after finishing Paxlovid *Paxlovid may interact some with your blood pressure medications, please discuss with the pharmacist and monitor your blood pressure - if running low or any concerns please schedule a follow up virtual or inperson visit  -there is a chance of rebound illness with covid after improving. This can happen whether or not you take an antiviral treatment. If you  become sick again with covid after getting better, please schedule a follow up virtual visit and isolate again.  -can use tylenol  if needed for fevers, aches and pains per instructions  -nasal saline sinus rinses twice daily  -stay hydrated, drink plenty of fluids and eat small healthy meals - avoid dairy  -follow up with your doctor in 2-3 days unless improving and feeling better  -stay home while sick, except to seek medical care. If you have COVID19, you will likely be contagious for 7-10 days. Flu or Influenza is likely contagious for about 7 days. Other respiratory viral infections remain contagious for 5-10+ days depending on the virus and many other factors. Wear a good mask that fits snugly (such as N95 or KN95) if around others to reduce the risk of transmission.  It was nice to meet you today, and I really hope you are feeling better soon. I help [facility] out with telemedicine visits on Tuesdays and Thursdays and am happy to help if you need a follow up virtual visit on those days. Otherwise, if you have any concerns or questions following this visit please schedule a follow up visit with your Primary Care doctor or seek care at a local urgent care clinic to avoid delays in care.    Seek in person care or schedule a follow up video visit promptly if your symptoms worsen, new concerns arise or you are not improving with treatment. Call 911 and/or seek emergency care if your symptoms are severe or life threatening.    See the following link for the  most recent information regarding Paxlovid:  [email or web address].com   Nirmatrelvir; Ritonavir Tablets What is this medication? NIRMATRELVIR; RITONAVIR (NIR ma TREL vir; ri TOE na veer) treats mild to moderate COVID-19. It may help people who are at high risk of developing severe illness. This medication works by limiting the spread of the virus in your body. The FDA has allowed the emergency use of this medication. This medicine may be used  for other purposes; ask your health care provider or pharmacist if you have questions. COMMON BRAND NAME(S): PAXLOVID What should I tell my care team before I take this medication? They need to know if you have any of these conditions: Any allergies Any serious illness Kidney disease Liver disease An unusual or allergic reaction to nirmatrelvir, ritonavir, other medications, foods, dyes, or preservatives Pregnant or trying to get pregnant Breast-feeding How should I use this medication? This product contains 2 different medications that are packaged together. For the standard dose, take 2 pink tablets of nirmatrelvir with 1 white tablet of ritonavir (3 tablets total) by mouth with water twice daily. Talk to your care team if you have kidney disease. You may need a different dose. Swallow the tablets whole. You can take it with or without food. If it upsets your stomach, take it with food. Take all of this medication unless your care team tells you to stop it early. Keep taking it even if you think you are better. Talk to your care team about the use of this medication in children. While it may be prescribed for children as young as 12 years for selected conditions, precautions do apply. Overdosage: If you think you have taken too much of this medicine contact a poison control center or emergency room at once. NOTE: This medicine is only for you. Do not share this medicine with others. What if I miss a dose? If you miss a dose, take it as soon as you can unless it is more than 8 hours late. If it is more than 8 hours late, skip the missed dose. Take the next dose at the normal time. Do not take extra or 2 doses at the same time to make up for the missed dose. What may interact with this medication? Do not take this medication with any of the following medications: Alfuzosin Certain medications for anxiety or sleep like midazolam, triazolam Certain medications for cancer like apalutamide,  enzalutamide Certain medications for cholesterol like lovastatin, simvastatin Certain medications for irregular heart beat like amiodarone, dronedarone, flecainide, propafenone, quinidine Certain medications for pain like meperidine, piroxicam Certain medications for psychotic disorders like clozapine, lurasidone, pimozide Certain medications for seizures like carbamazepine, phenobarbital, phenytoin Colchicine Eletriptan Eplerenone Ergot alkaloids like dihydroergotamine, ergonovine, ergotamine, methylergonovine Finerenone Flibanserin Ivabradine Lomitapide Naloxegol Ranolazine Rifampin Sildenafil Silodosin St. John's Wort Tolvaptan Ubrogepant Voclosporin This medication may also interact with the following medications: Bedaquiline Birth control pills Bosentan Certain antibiotics like erythromycin or clarithromycin Certain medications for blood pressure like amlodipine, diltiazem, felodipine, nicardipine, nifedipine Certain medications for cancer like abemaciclib, ceritinib, dasatinib, encorafenib, ibrutinib, ivosidenib, neratinib, nilotinib, venetoclax, vinblastine, vincristine Certain medications for cholesterol like atorvastatin, rosuvastatin Certain medications for depression like bupropion, trazodone Certain medications for fungal infections like isavuconazonium, itraconazole, ketoconazole, voriconazole Certain medications for hepatitis C like elbasvir; grazoprevir, dasabuvir; ombitasvir; paritaprevir; ritonavir, glecaprevir; pibrentasvir, sofosbuvir; velpatasvir; voxilaprevir Certain medications for HIV or AIDS Certain medications for irregular heartbeat like lidocaine Certain medications that treat or prevent blood clots like rivaroxaban, warfarin Digoxin Fentanyl Medications that lower your chance  of fighting infection like cyclosporine, sirolimus, tacrolimus Methadone Quetiapine Rifabutin Salmeterol Steroid medications like betamethasone, budesonide, ciclesonide,  dexamethasone, fluticasone, methylprednisone, mometasone, triamcinolone This list may not describe all possible interactions. Give your health care provider a list of all the medicines, herbs, non-prescription drugs, or dietary supplements you use. Also tell them if you smoke, drink alcohol, or use illegal drugs. Some items may interact with your medicine. What should I watch for while using this medication? Your condition will be monitored carefully while you are receiving this medication. Visit your care team for regular checkups. Tell your care team if your symptoms do not start to get better or if they get worse. If you have untreated HIV infection, this medication may lead to some HIV medications not working as well in the future. Birth control may not work properly while you are taking this medication. Talk to your care team about using an extra method of birth control. What side effects may I notice from receiving this medication? Side effects that you should report to your care team as soon as possible: Allergic reactions--skin rash, itching, hives, swelling of the face, lips, tongue, or throat Liver injury--right upper belly pain, loss of appetite, nausea, light-colored stool, dark yellow or brown urine, yellowing skin or eyes, unusual weakness or fatigue Redness, blistering, peeling, or loosening of the skin, including inside the mouth Side effects that usually do not require medical attention (report these to your care team if they continue or are bothersome): Change in taste Diarrhea General discomfort and fatigue Increase in blood pressure Muscle pain Nausea Stomach pain This list may not describe all possible side effects. Call your doctor for medical advice about side effects. You may report side effects to FDA at 1-800-FDA-1088. Where should I keep my medication? Keep out of the reach of children and pets. Store at room temperature between 20 and 25 degrees C (68 and 77 degrees  F). Get rid of any unused medication after the expiration date. To get rid of medications that are no longer needed or have expired: Take the medication to a medication take-back program. Check with your pharmacy or law enforcement to find a location. If you cannot return the medication, check the label or package insert to see if the medication should be thrown out in the garbage or flushed down the toilet. If you are not sure, ask your care team. If it is safe to put it in the trash, take the medication out of the container. Mix the medication with cat litter, dirt, coffee grounds, or other unwanted substance. Seal the mixture in a bag or container. Put it in the trash. NOTE: This sheet is a summary. It may not cover all possible information. If you have questions about this medicine, talk to your doctor, pharmacist, or health care provider.  2022 Elsevier/Gold Standard (2020-12-15 00:00:00)

## 2022-01-19 NOTE — Progress Notes (Signed)
Virtual Visit via Video Note  I connected with Angel Waller  on 01/19/22 at  5:20 PM EDT by a video enabled telemedicine application and verified that I am speaking with the correct person using two identifiers.  Location patient: [place] Location provider:work or home office Persons participating in the virtual visit: patient, provider  I discussed the limitations and requested verbal permission for telemedicine visit. The patient expressed understanding and agreed to proceed.   HPI:  Acute telemedicine visit for Covid19: -Onset: 1-2 days ago; tested positive for covid today -Symptoms include: nasal congestion, cough, pnd, body aches, chills -Denies: NVD, CP, SOB, fever -drinking fluids, able to get up and down -Pertinent past medical history: see below, had covid several years ago, denies any hx of kidney disease, GFR was 65 on last check in the last year -Pertinent medication allergies: Allergies  Allergen Reactions   Amoxicillin Swelling  -COVID-19 vaccine status: 2 doses and a booster Immunization History  Administered Date(s) Administered   Fluad Quad(high Dose 65+) 12/20/2018, 12/25/2019, 01/09/2021   Influenza Split 03/02/2011   Influenza Whole 02/24/2010   Influenza, High Dose Seasonal PF 12/14/2017, 12/25/2019   Influenza,inj,Quad PF,6+ Mos 02/26/2014, 12/31/2014, 01/06/2016, 11/17/2016   Influenza-Unspecified 01/12/2012, 12/27/2012   PFIZER(Purple Top)SARS-COV-2 Vaccination 05/21/2019, 06/11/2019, 04/01/2020   Pneumococcal Conjugate-13 07/01/2014   Pneumococcal Polysaccharide-23 03/03/2012   Tdap 02/29/2012   Varicella 07/18/2013   Zoster Recombinat (Shingrix) 12/14/2016, 06/22/2017     ROS: See pertinent positives and negatives per HPI.  Past Medical History:  Diagnosis Date   Allergy    Aortic stenosis    Arthritis    "right knee" (02/29/2012)   Bicuspid aortic valve 09/2008   STATUS POST BIOPROSTHETIC AORTIC VALVE REPLACEMENT   Blood transfusion without reported  diagnosis    Complete heart block ([place])    Heart murmur    Hyperlipemia    Migraines    "occasionally" (02/29/2012)   Osteopenia    Pacemaker    S/P AVR (aortic valve replacement)    July 2010   Seasonal allergies     Past Surgical History:  Procedure Laterality Date   ABDOMINAL HYSTERECTOMY     CARDIAC VALVE REPLACEMENT  2010   tissue aortic valve; [place]   COLONOSCOPY  2005   negative; [place], [place]   INSERT / REPLACE / REMOVE PACEMAKER     KNEE ARTHROSCOPY Right    PERMANENT PACEMAKER INSERTION  03/02/2012   Medtronic Adapta L implanted by Dr Rayann Heman   PERMANENT PACEMAKER INSERTION N/A 03/02/2012   Procedure: PERMANENT PACEMAKER INSERTION;  Surgeon: Thompson Grayer, MD;  Location: [facility] CATH LAB;  Service: Cardiovascular;  Laterality: N/A;   POLYPECTOMY     TEMPORARY PACEMAKER INSERTION N/A 03/01/2012   Procedure: TEMPORARY PACEMAKER INSERTION;  Surgeon: Burnell Blanks, MD;  Location: [facility] C[facility] LAB;  Service: Cardiovascular;  Laterality: N/A;   TONSILLECTOMY AND ADENOIDECTOMY  1962   TOTAL KNEE ARTHROPLASTY Right 01/07/2014   Procedure: RIGHT TOTAL KNEE ARTHROPLASTY;  Surgeon: Mauri Pole, MD;  Location: [facility] ORS;  Service: Orthopedics;  Laterality: Right;     Current Outpatient Medications:    aspirin 81 MG EC tablet, Take 1 tablet by mouth daily., Disp: , Rfl:    atorvastatin (LIPITOR) 40 MG tablet, TAKE 1 TABLET BY MOUTH DAILY, Disp: 90 tablet, Rfl: 1   benzonatate (TESSALON PERLES) 100 MG capsule, 1-2 capsules up to twice daily as needed for cough, Disp: 30 capsule, Rfl: 0   clindamycin (CLEOCIN) 300 MG capsule, For dental appt,  Disp: , Rfl:    estradiol (ESTRACE) 2 MG tablet, Take 2 mg by mouth daily. , Disp: , Rfl:    losartan (COZAAR) 25 MG tablet, Take 25 mg by mouth daily. , Disp: , Rfl:    Multiple Vitamins-Minerals (PRESERVISION AREDS 2 PO), Take by mouth daily., Disp: , Rfl:    nirmatrelvir/ritonavir EUA (PAXLOVID) 20 x 150 MG & 10 x '100MG'$  TABS, Take 3 tablets by  mouth 2 (two) times daily for 5 days. (Take nirmatrelvir 150 mg two tablets twice daily for 5 days and ritonavir 100 mg one tablet twice daily for 5 days) Patient GFR is > 60 on last check 6 months ago., Disp: 30 tablet, Rfl: 0   omega-3 acid ethyl esters (LOVAZA) 1 g capsule, , Disp: , Rfl:    senna (SENOKOT) 8.6 MG TABS tablet, Take 2 tablets by mouth 2 (two) times daily as needed for mild constipation., Disp: , Rfl:    torsemide (DEMADEX) 10 MG tablet, Take 10 mg by mouth daily. , Disp: , Rfl:    tretinoin microspheres (RETIN-A MICRO) 0.1 % gel, Apply at bedtime as directed, Disp: , Rfl:    Cholecalciferol 25 MCG (1000 UT) capsule, Take 2,000 Units by mouth daily.  (Patient not taking: Reported on 01/19/2022), Disp: , Rfl:   EXAM:  VITALS per patient if applicable:  GENERAL: alert, oriented, appears well and in no acute distress  HEENT: atraumatic, conjunttiva clear, no obvious abnormalities on inspection of external nose and ears  NECK: normal movements of the head and neck  LUNGS: on inspection no signs of respiratory distress, breathing rate appears normal, no obvious gross SOB, gasping or wheezing  CV: no obvious cyanosis  MS: moves all visible extremities without noticeable abnormality  PSYCH/NEURO: pleasant and cooperative, no obvious depression or anxiety, speech and thought processing grossly intact  ASSESSMENT AND PLAN:  Discussed the following assessment and plan:  COVID-19   Discussed treatment options, side effect and risk of drug interactions, ideal treatment window, potential complications, isolation and precautions for COVID-19.  Discussed possibility of rebound with or without antivirals. Checked for/reviewed last GFR - listed in HPI if available. After lengthy discussion, the patient opted for treatment with Paxlovid due to being higher risk for complications of covid or severe disease and other factors. Discussed EUA status of this drug and the fact that there is  preliminary limited knowledge of risks/interactions/side effects per EUA document vs possible benefits and precautions. This information was shared with patient during the visit and also was provided in patient instructions. Advised to hold statin x 8 days. Discussed potential interactions with her BP meds and advised to discuss further with pharmacist/treatment team as well. he patient did want a prescription for cough, Tessalon Rx sent.  Other symptomatic care measures summarized in patient instructions. Advised to seek prompt virtual visit or in person care if worsening, new symptoms arise, or if is not improving with treatment as expected per our conversation of expected course. Discussed options for follow up care. Did let this patient know that I do telemedicine on Tuesdays and Thursdays for [place] and those are the days I am logged into the system. Advised to schedule follow up visit with PCP, [place] virtual visits or [facility] if any further questions or concerns to avoid delays in care.   I discussed the assessment and treatment plan with the patient. The patient was provided an opportunity to ask questions and all were answered. The patient agreed with the plan and  demonstrated an understanding of the instructions.     Lucretia Kern, DO

## 2022-01-29 ENCOUNTER — Encounter: Payer: Self-pay | Admitting: Family Medicine

## 2022-01-29 ENCOUNTER — Ambulatory Visit (INDEPENDENT_AMBULATORY_CARE_PROVIDER_SITE_OTHER): Payer: Medicare PPO | Admitting: Family Medicine

## 2022-01-29 VITALS — BP 128/64 | HR 66 | Temp 97.1°F | Resp 17 | Ht 64.0 in | Wt 140.2 lb

## 2022-01-29 DIAGNOSIS — Z23 Encounter for immunization: Secondary | ICD-10-CM

## 2022-01-29 DIAGNOSIS — E785 Hyperlipidemia, unspecified: Secondary | ICD-10-CM | POA: Diagnosis not present

## 2022-01-29 DIAGNOSIS — Z0000 Encounter for general adult medical examination without abnormal findings: Secondary | ICD-10-CM

## 2022-01-29 LAB — HEPATIC FUNCTION PANEL
ALT: 21 U/L (ref 0–35)
AST: 26 U/L (ref 0–37)
Albumin: 3.8 g/dL (ref 3.5–5.2)
Alkaline Phosphatase: 53 U/L (ref 39–117)
Bilirubin, Direct: 0.1 mg/dL (ref 0.0–0.3)
Total Bilirubin: 0.3 mg/dL (ref 0.2–1.2)
Total Protein: 7.8 g/dL (ref 6.0–8.3)

## 2022-01-29 LAB — CBC WITH DIFFERENTIAL/PLATELET
Basophils Absolute: 0 10*3/uL (ref 0.0–0.1)
Basophils Relative: 0.7 % (ref 0.0–3.0)
Eosinophils Absolute: 0.2 10*3/uL (ref 0.0–0.7)
Eosinophils Relative: 2.9 % (ref 0.0–5.0)
HCT: 35.8 % — ABNORMAL LOW (ref 36.0–46.0)
Hemoglobin: 11.8 g/dL — ABNORMAL LOW (ref 12.0–15.0)
Lymphocytes Relative: 25 % (ref 12.0–46.0)
Lymphs Abs: 1.8 10*3/uL (ref 0.7–4.0)
MCHC: 33 g/dL (ref 30.0–36.0)
MCV: 91.4 fl (ref 78.0–100.0)
Monocytes Absolute: 0.5 10*3/uL (ref 0.1–1.0)
Monocytes Relative: 7 % (ref 3.0–12.0)
Neutro Abs: 4.8 10*3/uL (ref 1.4–7.7)
Neutrophils Relative %: 64.4 % (ref 43.0–77.0)
Platelets: 228 10*3/uL (ref 150.0–400.0)
RBC: 3.91 Mil/uL (ref 3.87–5.11)
RDW: 13.4 % (ref 11.5–15.5)
WBC: 7.4 10*3/uL (ref 4.0–10.5)

## 2022-01-29 LAB — BASIC METABOLIC PANEL
BUN: 26 mg/dL — ABNORMAL HIGH (ref 6–23)
CO2: 30 mEq/L (ref 19–32)
Calcium: 9.3 mg/dL (ref 8.4–10.5)
Chloride: 99 mEq/L (ref 96–112)
Creatinine, Ser: 0.83 mg/dL (ref 0.40–1.20)
GFR: 68.2 mL/min (ref >60.00)
Glucose, Bld: 84 mg/dL (ref 70–99)
Potassium: 3.8 mEq/L (ref 3.5–5.1)
Sodium: 136 mEq/L (ref 135–145)

## 2022-01-29 LAB — LIPID PANEL
Cholesterol: 203 mg/dL — ABNORMAL HIGH (ref 0–200)
HDL: 45 mg/dL (ref >39.00)
LDL Cholesterol: 127 mg/dL — ABNORMAL HIGH (ref 0–99)
NonHDL: 157.54
Total CHOL/HDL Ratio: 5
Triglycerides: 155 mg/dL — ABNORMAL HIGH (ref 0.0–149.0)
VLDL: 31 mg/dL (ref 0.0–40.0)

## 2022-01-29 LAB — TSH: TSH: 2.42 u[IU]/mL (ref 0.35–5.50)

## 2022-01-29 NOTE — Progress Notes (Unsigned)
   Subjective:    Patient ID: Angel Waller, female    DOB: September 18, 1945, 76 y.o.   MRN: [id number]  HPI CPE- UTD on mammo, colonoscopy, PNA.  Flu vaccine today  Patient Care Team    Relationship Specialty Notifications Start End  Midge Minium, MD PCP - General Family Medicine  08/03/13   Thompson Grayer, MD Consulting Physician Cardiology  07/01/14   Inda Castle, MD (Inactive) Consulting Physician Gastroenterology  07/01/14   Pleasant, Robley Fries., MD Referring Physician Obstetrics and Gynecology  07/03/15   Mackie Pai, MD Referring Physician Cardiology  11/17/16   [place], Amy, [place] Physician Dermatology  11/17/16   Paralee Cancel, MD Consulting Physician Orthopedic Surgery  12/14/17     Health Maintenance  Topic Date Due   Lung Cancer Screening  Never done   COVID-19 Vaccine (4 - Pfizer series) 02/04/2022 (Originally 05/27/2020)   INFLUENZA VACCINE  06/27/2022 (Originally 10/27/2021)   TETANUS/TDAP  02/28/2022   MAMMOGRAM  03/27/2022   Medicare Annual Wellness (AWV)  12/31/2022   COLONOSCOPY (Pts 45-101yr Insurance coverage will need to be confirmed)  05/04/2023   Pneumonia Vaccine 76 Years old  Completed   DEXA SCAN  Completed   Hepatitis C Screening  Completed   Zoster Vaccines- Shingrix  Completed   HPV VACCINES  Aged Out     Review of Systems Patient reports no vision/ hearing changes, adenopathy,fever, weight change,  persistant/recurrent hoarseness , swallowing issues, chest pain, palpitations, edema, persistant/recurrent cough, hemoptysis, dyspnea (rest/exertional/paroxysmal nocturnal), gastrointestinal bleeding (melena, rectal bleeding), abdominal pain, significant heartburn, bowel changes, GU symptoms (dysuria, hematuria, incontinence), Gyn symptoms (abnormal  bleeding, pain),  syncope, focal weakness, memory loss, numbness & tingling, skin/hair/nail changes, abnormal bruising or bleeding, anxiety, or depression.     Objective:   Physical  Exam General Appearance:    Alert, cooperative, no distress, appears stated age  Head:    Normocephalic, without obvious abnormality, atraumatic  Eyes:    PERRL, conjunctiva/corneas clear, EOM's intact both eyes  Ears:    Cerumen impaction L ear- successfully removed via curette.  Normal TM's both ears.  Nose:   Nares normal, septum midline, mucosa normal, no drainage    or sinus tenderness  Throat:   Lips, mucosa, and tongue normal; teeth and gums normal  Neck:   Supple, symmetrical, trachea midline, no adenopathy;    Thyroid: no enlargement/tenderness/nodules  Back:     Symmetric, no curvature, ROM normal, no CVA tenderness  Lungs:     Clear to auscultation bilaterally, respirations unlabored  Chest Wall:    No tenderness or deformity   Heart:    Regular rate and rhythm, S1 and S2 normal, no murmur, rub   or gallop  Breast Exam:    Deferred to mammo  Abdomen:     Soft, non-tender, bowel sounds active all four quadrants,    no masses, no organomegaly  Genitalia:    Deferred  Rectal:    Extremities:   Extremities normal, atraumatic, no cyanosis or edema  Pulses:   2+ and symmetric all extremities  Skin:   Skin color, texture, turgor normal, no rashes or lesions  Lymph nodes:   Cervical, supraclavicular, and axillary nodes normal  Neurologic:   CNII-XII intact, normal strength, sensation and reflexes    throughout          Assessment & Plan:

## 2022-01-29 NOTE — Patient Instructions (Signed)
Follow up in 6 months to recheck BP and cholesterol We'll notify you of your lab results and make any changes if needed Keep up the good work on healthy diet and regular exercise- you look fantastic! Call with any questions or concerns Stay Safe!  Stay Healthy! HAPPY BIRTHDAY!!!

## 2022-02-01 ENCOUNTER — Encounter: Payer: Self-pay | Admitting: Family Medicine

## 2022-02-01 NOTE — Progress Notes (Signed)
Informed pt of lab results  

## 2022-02-02 ENCOUNTER — Other Ambulatory Visit (HOSPITAL_BASED_OUTPATIENT_CLINIC_OR_DEPARTMENT_OTHER): Payer: Self-pay

## 2022-02-02 NOTE — Assessment & Plan Note (Signed)
Pt's PE WNL.  UTD on mammo, colonoscopy, PNA vaccines.  Flu shot given today.  Check labs.  Anticipatory guidance provided.

## 2022-02-03 DIAGNOSIS — Z950 Presence of cardiac pacemaker: Secondary | ICD-10-CM | POA: Diagnosis not present

## 2022-02-09 DIAGNOSIS — I442 Atrioventricular block, complete: Secondary | ICD-10-CM | POA: Diagnosis not present

## 2022-02-09 DIAGNOSIS — Z950 Presence of cardiac pacemaker: Secondary | ICD-10-CM | POA: Diagnosis not present

## 2022-02-09 DIAGNOSIS — Z45018 Encounter for adjustment and management of other part of cardiac pacemaker: Secondary | ICD-10-CM | POA: Diagnosis not present

## 2022-04-09 DIAGNOSIS — Z953 Presence of xenogenic heart valve: Secondary | ICD-10-CM | POA: Diagnosis not present

## 2022-04-09 DIAGNOSIS — I2510 Atherosclerotic heart disease of native coronary artery without angina pectoris: Secondary | ICD-10-CM | POA: Diagnosis not present

## 2022-04-09 DIAGNOSIS — E782 Mixed hyperlipidemia: Secondary | ICD-10-CM | POA: Diagnosis not present

## 2022-04-09 DIAGNOSIS — I442 Atrioventricular block, complete: Secondary | ICD-10-CM | POA: Diagnosis not present

## 2022-04-20 DIAGNOSIS — H2513 Age-related nuclear cataract, bilateral: Secondary | ICD-10-CM | POA: Diagnosis not present

## 2022-04-20 DIAGNOSIS — H25013 Cortical age-related cataract, bilateral: Secondary | ICD-10-CM | POA: Diagnosis not present

## 2022-04-20 DIAGNOSIS — H353131 Nonexudative age-related macular degeneration, bilateral, early dry stage: Secondary | ICD-10-CM | POA: Diagnosis not present

## 2022-04-22 ENCOUNTER — Encounter (HOSPITAL_COMMUNITY): Payer: Self-pay

## 2022-04-22 ENCOUNTER — Observation Stay (HOSPITAL_BASED_OUTPATIENT_CLINIC_OR_DEPARTMENT_OTHER)
Admission: EM | Admit: 2022-04-22 | Discharge: 2022-04-24 | Disposition: A | Discharge status: Home / Self Care | Payer: Medicare PPO | Attending: Internal Medicine | Admitting: Internal Medicine

## 2022-04-22 ENCOUNTER — Emergency Department (HOSPITAL_BASED_OUTPATIENT_CLINIC_OR_DEPARTMENT_OTHER): Payer: Medicare PPO

## 2022-04-22 ENCOUNTER — Other Ambulatory Visit: Payer: Self-pay

## 2022-04-22 DIAGNOSIS — E785 Hyperlipidemia, unspecified: Secondary | ICD-10-CM | POA: Diagnosis present

## 2022-04-22 DIAGNOSIS — Z79899 Other long term (current) drug therapy: Secondary | ICD-10-CM | POA: Insufficient documentation

## 2022-04-22 DIAGNOSIS — R41841 Cognitive communication deficit: Secondary | ICD-10-CM | POA: Insufficient documentation

## 2022-04-22 DIAGNOSIS — I63511 Cerebral infarction due to unspecified occlusion or stenosis of right middle cerebral artery: Secondary | ICD-10-CM | POA: Diagnosis present

## 2022-04-22 DIAGNOSIS — Z950 Presence of cardiac pacemaker: Secondary | ICD-10-CM | POA: Diagnosis present

## 2022-04-22 DIAGNOSIS — Z87891 Personal history of nicotine dependence: Secondary | ICD-10-CM | POA: Insufficient documentation

## 2022-04-22 DIAGNOSIS — I10 Essential (primary) hypertension: Secondary | ICD-10-CM | POA: Diagnosis present

## 2022-04-22 DIAGNOSIS — R29810 Facial weakness: Principal | ICD-10-CM | POA: Insufficient documentation

## 2022-04-22 DIAGNOSIS — I5032 Chronic diastolic (congestive) heart failure: Secondary | ICD-10-CM | POA: Diagnosis present

## 2022-04-22 DIAGNOSIS — Z96651 Presence of right artificial knee joint: Secondary | ICD-10-CM | POA: Insufficient documentation

## 2022-04-22 DIAGNOSIS — Z7982 Long term (current) use of aspirin: Secondary | ICD-10-CM | POA: Diagnosis not present

## 2022-04-22 DIAGNOSIS — I110 Hypertensive heart disease with heart failure: Secondary | ICD-10-CM | POA: Insufficient documentation

## 2022-04-22 DIAGNOSIS — I639 Cerebral infarction, unspecified: Secondary | ICD-10-CM | POA: Diagnosis not present

## 2022-04-22 DIAGNOSIS — I63233 Cerebral infarction due to unspecified occlusion or stenosis of bilateral carotid arteries: Secondary | ICD-10-CM | POA: Diagnosis not present

## 2022-04-22 DIAGNOSIS — R4182 Altered mental status, unspecified: Secondary | ICD-10-CM | POA: Diagnosis not present

## 2022-04-22 DIAGNOSIS — Z953 Presence of xenogenic heart valve: Secondary | ICD-10-CM

## 2022-04-22 LAB — URINALYSIS, ROUTINE W REFLEX MICROSCOPIC
Bilirubin Urine: NEGATIVE
Glucose, UA: NEGATIVE mg/dL
Ketones, ur: 15 mg/dL — AB
Nitrite: NEGATIVE
Protein, ur: NEGATIVE mg/dL
Specific Gravity, Urine: 1.01 (ref 1.005–1.030)
pH: 6 (ref 5.0–8.0)

## 2022-04-22 LAB — CBC
HCT: 36 % (ref 36.0–46.0)
Hemoglobin: 12 g/dL (ref 12.0–15.0)
MCH: 30.1 pg (ref 26.0–34.0)
MCHC: 33.3 g/dL (ref 30.0–36.0)
MCV: 90.2 fL (ref 80.0–100.0)
Platelets: 184 10*3/uL (ref 150–400)
RBC: 3.99 MIL/uL (ref 3.87–5.11)
RDW: 13 % (ref 11.5–15.5)
WBC: 6.6 10*3/uL (ref 4.0–10.5)
nRBC: 0 % (ref 0.0–0.2)

## 2022-04-22 LAB — COMPREHENSIVE METABOLIC PANEL
ALT: 13 U/L (ref 0–44)
AST: 24 U/L (ref 15–41)
Albumin: 3.2 g/dL — ABNORMAL LOW (ref 3.5–5.0)
Alkaline Phosphatase: 54 U/L (ref 38–126)
Anion gap: 9 (ref 5–15)
BUN: 22 mg/dL (ref 8–23)
CO2: 23 mmol/L (ref 22–32)
Calcium: 8.7 mg/dL — ABNORMAL LOW (ref 8.9–10.3)
Chloride: 102 mmol/L (ref 98–111)
Creatinine, Ser: 0.81 mg/dL (ref 0.44–1.00)
GFR, Estimated: >60 mL/min (ref >60)
Glucose, Bld: 90 mg/dL (ref 70–99)
Potassium: 3.4 mmol/L — ABNORMAL LOW (ref 3.5–5.1)
Sodium: 134 mmol/L — ABNORMAL LOW (ref 135–145)
Total Bilirubin: 0.7 mg/dL (ref 0.3–1.2)
Total Protein: 7.5 g/dL (ref 6.5–8.1)

## 2022-04-22 LAB — DIFFERENTIAL
Abs Immature Granulocytes: 0.01 10*3/uL (ref 0.00–0.07)
Basophils Absolute: 0 10*3/uL (ref 0.0–0.1)
Basophils Relative: 1 %
Eosinophils Absolute: 0.1 10*3/uL (ref 0.0–0.5)
Eosinophils Relative: 2 %
Immature Granulocytes: 0 %
Lymphocytes Relative: 32 %
Lymphs Abs: 2.1 10*3/uL (ref 0.7–4.0)
Monocytes Absolute: 0.6 10*3/uL (ref 0.1–1.0)
Monocytes Relative: 9 %
Neutro Abs: 3.7 10*3/uL (ref 1.7–7.7)
Neutrophils Relative %: 56 %

## 2022-04-22 LAB — URINALYSIS, MICROSCOPIC (REFLEX)

## 2022-04-22 LAB — ETHANOL: Alcohol, Ethyl (B): <10 mg/dL (ref <10)

## 2022-04-22 LAB — APTT: aPTT: 26 seconds (ref 24–36)

## 2022-04-22 LAB — PROTIME-INR
INR: 1 (ref 0.8–1.2)
Prothrombin Time: 13.4 seconds (ref 11.4–15.2)

## 2022-04-22 LAB — CBG MONITORING, ED: Glucose-Capillary: 96 mg/dL (ref 70–99)

## 2022-04-22 MED ORDER — ASPIRIN 325 MG PO TABS
650.0000 mg | ORAL_TABLET | Freq: Every day | ORAL | Status: DC
  Administered 2022-04-22: 650 mg via ORAL
  Filled 2022-04-22: qty 2

## 2022-04-22 MED ORDER — IOHEXOL 350 MG/ML SOLN
75.0000 mL | Freq: Once | INTRAVENOUS | Status: AC | PRN
  Administered 2022-04-22: 75 mL via INTRAVENOUS

## 2022-04-22 MED ORDER — CLOPIDOGREL BISULFATE 300 MG PO TABS
300.0000 mg | ORAL_TABLET | Freq: Once | ORAL | Status: AC
  Administered 2022-04-22: 300 mg via ORAL
  Filled 2022-04-22: qty 1

## 2022-04-22 NOTE — ED Triage Notes (Signed)
Pt presents with daughter who states pt had odd behavior noted first last night about 1730.  PT was late to dinner.  Had appearance of being intoxicated but does not drink.  Daughter feels left side of face is drooping but pt herself states this is her normal.  Pt denies any need to be evaluated but consents to make her family feel less anxious.  NIH done in triage is 0.

## 2022-04-22 NOTE — ED Notes (Addendum)
Patient has a pacemaker, unable to receive an MRI.

## 2022-04-22 NOTE — ED Notes (Signed)
Patient transported to CT 

## 2022-04-22 NOTE — Plan of Care (Signed)
Called by Dr. Oswald Hillock, at [facility] regarding this patient who had transient left facial droop yesterday last known well 7030 hours on 04/21/2022, now with resolution of symptoms.  CT head reveals right MCA infarction which is acute.  CT angiography with critical right M1/M2 stenosis. I recommended admission for further stroke workup to the hospitalist. I also recommended loading with aspirin 650 and Plavix 300 for now and continuing dual antiplatelets-aspirin 325+ Plavix 75 from tomorrow. Neurology service will evaluate the patient once she arrives at [facility].  Please call the on-call neurologist to let them know patient arrival. Plan was discussed with Dr. Oswald Hillock  -- Amie Portland, MD Neurologist [facility] Pager: [phone]

## 2022-04-22 NOTE — ED Provider Notes (Signed)
[facility] [place] [facility] Provider Note   CSN: [id number] Arrival date & time: 04/22/22  1836     History Chief Complaint  Patient presents with   Facial Droop    HPI Angel Waller is a 77 y.o. female presenting for chief complaint of altered mental status.  She is a otherwise healthy 77 year old female who had erratic behaviors last night.  She was wandering around the house, showed up late to a scheduled dinner with friends (is normally very punctual) dropped multiple cups of water around the house, seems confused.  Was walking around with only 1 shoe on.  Per daughter, she is normally very functional takes care of her self etc. She does have a cardiac history. Per daughter, she appears to have improved today and was able to tolerate lunch.  However as the daughter discovered more the symptoms from last night, she called friends for more collateral, she recommended the patient come in for further care and management.  Patient denies any symptoms at this time..   Patient's recorded medical, surgical, social, medication list and allergies were reviewed in the Snapshot window as part of the initial history.   Review of Systems   Review of Systems  Constitutional:  Negative for chills and fever.  HENT:  Negative for ear pain and sore throat.   Eyes:  Negative for pain and visual disturbance.  Respiratory:  Negative for cough and shortness of breath.   Cardiovascular:  Negative for chest pain and palpitations.  Gastrointestinal:  Negative for abdominal pain and vomiting.  Genitourinary:  Negative for dysuria and hematuria.  Musculoskeletal:  Negative for arthralgias and back pain.  Skin:  Negative for color change and rash.  Neurological:  Negative for seizures and syncope.  Psychiatric/Behavioral:  Positive for confusion.   All other systems reviewed and are negative.   Physical Exam Updated Vital Signs BP (!) 126/59   Pulse (!) 59   Temp 98 F  (36.7 C) (Oral)   Resp 17   LMP  (LMP Unknown) Comment: every 4 months take norethrine to clean out uterus   SpO2 93%  Physical Exam Vitals and nursing note reviewed.  Constitutional:      General: She is not in acute distress.    Appearance: She is well-developed.  HENT:     Head: Normocephalic and atraumatic.  Eyes:     Conjunctiva/sclera: Conjunctivae normal.  Cardiovascular:     Rate and Rhythm: Normal rate and regular rhythm.     Heart sounds: No murmur heard. Pulmonary:     Effort: Pulmonary effort is normal. No respiratory distress.     Breath sounds: Normal breath sounds.  Abdominal:     General: There is no distension.     Palpations: Abdomen is soft.     Tenderness: There is no abdominal tenderness. There is no right CVA tenderness or left CVA tenderness.  Musculoskeletal:        General: No swelling or tenderness. Normal range of motion.     Cervical back: Neck supple.  Skin:    General: Skin is warm and dry.  Neurological:     General: No focal deficit present.     Mental Status: She is alert and oriented to person, place, and time. Mental status is at baseline.     Cranial Nerves: No cranial nerve deficit.      ED Course/ Medical Decision Making/ A&P Clinical Course as of 04/22/22 2329  Thu Apr 22, 2022  1905 Behavioural changes. Wandering around the house last night Went to dinner and was not making sense at dinner. Very distracted.  [CC]  1907 Facial droop since last night. Still persistent  [CC]  1926 Last known normal 5:30 PM last night. [CC]    Clinical Course User Index [CC] Tretha Sciara, MD    Procedures Procedures   Medications Ordered in ED Medications  aspirin tablet 650 mg (650 mg Oral Given 04/22/22 2217)  iohexol (OMNIPAQUE) 350 MG/ML injection 75 mL (75 mLs Intravenous Contrast Given 04/22/22 2037)  clopidogrel (PLAVIX) tablet 300 mg (300 mg Oral Given 04/22/22 2217)    Medical Decision Making:    Angel Waller is a 77  y.o. female who presented to the ED today with altered mental status detailed above.     Patient's presentation is complicated by their history of advanced age.  Patient placed on continuous vitals and telemetry monitoring while in ED which was reviewed periodically.   Complete initial physical exam performed, notably the patient  was hemodynamically stable in no acute distress.    Reviewed and confirmed nursing documentation for past medical history, family history, social history.    Initial Assessment:   With the patient's presentation of altered mental status, most likely diagnosis is TIA versus delirium secondary to underlying etiology such as urinary tract infection or respiratory infection. Other diagnoses were considered including (but not limited to) sepsis, CVA, PE, ACS, pneumonia, metabolic emergency. These are considered less likely due to history of present illness and physical exam findings.   This is most consistent with an acute life/limb threatening illness complicated by underlying chronic conditions.  Initial Plan:  CT head to evaluate for structural intracranial etiology.  Notably, even though CVA is on the differential, patient is outside TNK window.  Symptoms are grossly resolving and TIA would be more likely though still favored less consistent with the history of present illness and physical exam findings and then more diffuse delirium etiology evaluated as below Screening labs including CBC and Metabolic panel to evaluate for infectious or metabolic etiology of disease.  Urinalysis with reflex culture ordered to evaluate for UTI or relevant urologic/nephrologic pathology.  CXR to evaluate for structural/infectious intrathoracic pathology.  EKG to evaluate for cardiac pathology. Objective evaluation as below reviewed with plan for close reassessment  Initial Study Results:   Radiology  All images reviewed independently. Agree with radiology report at this time.   CT  ANGIO HEAD NECK W WO CM  Result Date: 04/22/2022 CLINICAL DATA:  Stroke, follow up EXAM: CT ANGIOGRAPHY HEAD AND NECK TECHNIQUE: Multidetector CT imaging of the head and neck was performed using the standard protocol during bolus administration of intravenous contrast. Multiplanar CT image reconstructions and MIPs were obtained to evaluate the vascular anatomy. Carotid stenosis measurements (when applicable) are obtained utilizing NASCET criteria, using the distal internal carotid diameter as the denominator. RADIATION DOSE REDUCTION: This exam was performed according to the departmental dose-optimization program which includes automated exposure control, adjustment of the mA and/or kV according to patient size and/or use of iterative reconstruction technique. CONTRAST:  24m OMNIPAQUE IOHEXOL 350 MG/ML SOLN COMPARISON:  Same day CT head. FINDINGS: CTA NECK FINDINGS Aortic arch: Great vessel origins are patent. Calcific atherosclerosis of the aorta and great vessels. Right carotid system: Atherosclerosis at the carotid bifurcation without greater than 50% stenosis. Left carotid system: Atherosclerosis at the carotid bifurcation without greater than 50% stenosis Vertebral arteries: Left dominant. Both vertebral arteries are patent without significant (greater  than 50%) stenosis. No acute findings. Skeleton: No acute findings. Other neck: No acute findings. Upper chest: Emphysema. No consolidation the visualized lung apices. Review of the MIP images confirms the above findings CTA HEAD FINDINGS Anterior circulation: Bilateral intracranial ICAs are patent without significant stenosis. Filling defect within the distal right M1 MCA that extends into proximal M2 MCA branches, compatible with thrombus. Resulting severe stenosis, nearly occlusive. Left MCA and bilateral ACAs are patent without proximal high-grade stenosis. Posterior circulation: Bilateral intradural vertebral arteries, basilar artery and bilateral  posterior cerebral arteries are patent without proximal high-grade stenosis. Venous sinuses: As permitted by contrast timing, patent. Review of the MIP images confirms the above findings IMPRESSION: Filling defect within the distal right M1 MCA that extends into proximal M2 MCA branches, compatible with thrombus. Resulting severe stenosis, nearly occlusive. Findings discussed with Dr. Oswald Hillock via telephone at 8:53 PM. Electronically Signed   By: Margaretha Sheffield M.D.   On: 04/22/2022 20:56   CT HEAD WO CONTRAST  Result Date: 04/22/2022 CLINICAL DATA:  Altered mental status. EXAM: CT HEAD WITHOUT CONTRAST TECHNIQUE: Contiguous axial images were obtained from the base of the skull through the vertex without intravenous contrast. RADIATION DOSE REDUCTION: This exam was performed according to the departmental dose-optimization program which includes automated exposure control, adjustment of the mA and/or kV according to patient size and/or use of iterative reconstruction technique. COMPARISON:  Head CT dated 02/29/2012. FINDINGS: Brain: Mild age-related atrophy and chronic microvascular ischemic changes. A hypoattenuating area in the right insula consistent with an age indeterminate right MCA territory infarct, possibly subacute or chronic. Acute infarct is less likely but not excluded. There is no acute intracranial hemorrhage. No mass effect or midline shift. No extra-axial fluid collection. Vascular: Question slight higher attenuation of M1 segment of the right MCA (coronal 41/4). Further evaluation with CT angiography is recommended. Skull: Normal. Negative for fracture or focal lesion. Sinuses/Orbits: No acute finding. Other: None IMPRESSION: 1. No acute intracranial hemorrhage. 2. Age indeterminate right MCA territory infarct, likely subacute or chronic. Correlation with clinical exam and further evaluation with MRI or CT angiography is recommended. 3. Question slight higher attenuation of the M1 segment  of the right MCA. These results were called by telephone at the time of interpretation on 04/22/2022 at 7:36 pm to provider [facility] , who verbally acknowledged these results. Electronically Signed   By: Anner Crete M.D.   On: 04/22/2022 19:43     Final Assessment and Plan:   Patient's objective findings demonstrate right-sided CVA.  I informed the patient immediately of these findings and called neurology who recommended CT angiography and medical evaluation.  Patient is outside of all windows for intervention.  CTA reveals critical stenosis of vessels.  Again informed Dr. Malen Gauze of neurology of these findings.  He reviewed the images as well.  He recommended aspirin Plavix load transfer to main campus for neurologic evaluation with no other acute indication for intervention at this time.   Disposition:   Based on the above findings, I believe this patient is stable for admission.    Patient/family educated about specific findings on our evaluation and explained exact reasons for admission.  Patient/family educated about clinical situation and time was allowed to answer questions.   Admission team communicated with and agreed with need for admission. Patient admitted. Patient ready to move at this time.     Emergency Department Medication Summary:   Medications  aspirin tablet 650 mg (650 mg Oral Given 04/22/22 2217)  iohexol (OMNIPAQUE) 350 MG/ML injection 75 mL (75 mLs Intravenous Contrast Given 04/22/22 2037)  clopidogrel (PLAVIX) tablet 300 mg (300 mg Oral Given 04/22/22 2217)         Clinical Impression:  1. Cerebrovascular accident (CVA), unspecified mechanism ([place])      Admit   Final Clinical Impression(s) / ED Diagnoses Final diagnoses:  Cerebrovascular accident (CVA), unspecified mechanism ([place])    Rx / DC Orders ED Discharge Orders     None         Tretha Sciara, MD 04/22/22 2341

## 2022-04-22 NOTE — Progress Notes (Signed)
Hospitalist Transfer Note:  Transferring facility: [facility] Requesting provider: Dr. Oswald Hillock (EDP at [facility]) Reason for transfer: admission for further evaluation and management of acute ischemic right MCA stroke.     77 year old female who presented to [facility] ED for evaluation of episode of altered mental status, delirium associated with facial droop that started abruptly at 1700 yesterday (1/24) while at dinner with friends.  Husband states the patient was in her normal state of health and baseline mental status while getting ready for dinner yesterday before developing an abrupt change in her mental status at 1700 while at dinner, which she was noted to be behaving erratically, spilling drinks.  This was also noted to be associated with facial droop.  The patient's has been transported the patient back home where she went to sleep and upon awakening the next morning (morning of 1/25), she was noted to be back to her baseline mental status and with interval resolution in her facial droop.  However, upon hearing of the above incident at dinner occurring on 04/21/2022, the patient's daughter brought the patient to [facility] emergency department today for further evaluation.  Ensuing CT head showed evidence of right MCA territory infarct.  She remained at her baseline mental status throughout the course at [facility], with no evidence of acute focal neurologic deficits, and NIH noted to be 0.  EDP d/w case/imaging with on-call neurology, Dr. Rory Percy, Who conveyed that the patient is outside of the window for tPA/thrombectomy, and recommended admission to the hospitalist service at [facility] for further evaluation/management of acute ischemic stroke.   Dr. Rory Percy asked to be directly notified once the patient has arrived at [facility].   Medications administered prior to transfer included the following: Aspirin 650 mg p.o. x 1, Plavix 3 mg p.o. x 1.   Subsequently, I  accepted this patient for transfer for observation to a med/tele bed at [facility] for further work-up and management of the above.      Check [email or web address] for on-call coverage.   Nursing staff, Please call [facility] [place] [facility] number on Amion as soon as patient's arrival, so appropriate admitting provider can evaluate the pt.     Babs Bertin, DO Hospitalist

## 2022-04-23 ENCOUNTER — Encounter (HOSPITAL_COMMUNITY): Payer: Self-pay | Admitting: Internal Medicine

## 2022-04-23 DIAGNOSIS — Z7982 Long term (current) use of aspirin: Secondary | ICD-10-CM | POA: Diagnosis not present

## 2022-04-23 DIAGNOSIS — I10 Essential (primary) hypertension: Secondary | ICD-10-CM | POA: Diagnosis present

## 2022-04-23 DIAGNOSIS — Z87891 Personal history of nicotine dependence: Secondary | ICD-10-CM | POA: Diagnosis not present

## 2022-04-23 DIAGNOSIS — Z79899 Other long term (current) drug therapy: Secondary | ICD-10-CM | POA: Diagnosis not present

## 2022-04-23 DIAGNOSIS — I63511 Cerebral infarction due to unspecified occlusion or stenosis of right middle cerebral artery: Secondary | ICD-10-CM | POA: Diagnosis not present

## 2022-04-23 DIAGNOSIS — Z950 Presence of cardiac pacemaker: Secondary | ICD-10-CM | POA: Diagnosis not present

## 2022-04-23 DIAGNOSIS — Z96651 Presence of right artificial knee joint: Secondary | ICD-10-CM | POA: Diagnosis not present

## 2022-04-23 DIAGNOSIS — I5032 Chronic diastolic (congestive) heart failure: Secondary | ICD-10-CM | POA: Diagnosis not present

## 2022-04-23 DIAGNOSIS — R29810 Facial weakness: Secondary | ICD-10-CM | POA: Diagnosis not present

## 2022-04-23 DIAGNOSIS — I110 Hypertensive heart disease with heart failure: Secondary | ICD-10-CM | POA: Diagnosis not present

## 2022-04-23 DIAGNOSIS — R41841 Cognitive communication deficit: Secondary | ICD-10-CM | POA: Diagnosis not present

## 2022-04-23 LAB — HEMOGLOBIN A1C
Hgb A1c MFr Bld: 5.5 % (ref 4.8–5.6)
Mean Plasma Glucose: 111.15 mg/dL

## 2022-04-23 MED ORDER — SENNOSIDES-DOCUSATE SODIUM 8.6-50 MG PO TABS: 1.0000 | ORAL_TABLET | Freq: Every evening | ORAL | Status: DC | PRN

## 2022-04-23 MED ORDER — STROKE: EARLY STAGES OF RECOVERY BOOK
Freq: Once | Status: AC
  Filled 2022-04-23: qty 1

## 2022-04-23 MED ORDER — ACETAMINOPHEN 650 MG RE SUPP: 650.0000 mg | RECTAL | Status: DC | PRN

## 2022-04-23 MED ORDER — ENOXAPARIN SODIUM 40 MG/0.4ML IJ SOSY
40.0000 mg | PREFILLED_SYRINGE | INTRAMUSCULAR | Status: DC
  Administered 2022-04-23: 40 mg via SUBCUTANEOUS
  Filled 2022-04-23: qty 0.4

## 2022-04-23 MED ORDER — ACETAMINOPHEN 160 MG/5ML PO SOLN: 650.0000 mg | ORAL | Status: DC | PRN

## 2022-04-23 MED ORDER — CLOPIDOGREL BISULFATE 75 MG PO TABS
75.0000 mg | ORAL_TABLET | Freq: Every day | ORAL | Status: DC
  Administered 2022-04-23 – 2022-04-24 (×2): 75 mg via ORAL
  Filled 2022-04-23 (×2): qty 1

## 2022-04-23 MED ORDER — ATORVASTATIN CALCIUM 40 MG PO TABS
40.0000 mg | ORAL_TABLET | Freq: Every day | ORAL | Status: DC
  Administered 2022-04-23 – 2022-04-24 (×2): 40 mg via ORAL
  Filled 2022-04-23 (×2): qty 1

## 2022-04-23 MED ORDER — PRESERVISION AREDS PO TABS: 1.0000 | ORAL_TABLET | Freq: Two times a day (BID) | ORAL | Status: DC

## 2022-04-23 MED ORDER — ADULT MULTIVITAMIN W/MINERALS CH
1.0000 | ORAL_TABLET | Freq: Every day | ORAL | Status: DC
  Filled 2022-04-23: qty 1

## 2022-04-23 MED ORDER — ACETAMINOPHEN 325 MG PO TABS: 650.0000 mg | ORAL_TABLET | ORAL | Status: DC | PRN

## 2022-04-23 MED ORDER — ASPIRIN 81 MG PO TBEC
81.0000 mg | DELAYED_RELEASE_TABLET | Freq: Every day | ORAL | Status: DC
  Administered 2022-04-23 – 2022-04-24 (×2): 81 mg via ORAL
  Filled 2022-04-23 (×2): qty 1

## 2022-04-23 MED ORDER — SODIUM CHLORIDE 0.9 % IV SOLN: INTRAVENOUS | Status: DC

## 2022-04-23 NOTE — ED Notes (Signed)
[facility] into transport 

## 2022-04-23 NOTE — Consult Note (Signed)
NEURO HOSPITALIST CONSULT NOTE   Requesting physician: Dr. Lorin Mercy  Reason for Consult: Acute stroke with left sided weakness  History obtained from:  Patient, Husband and Chart     HPI:                                                                                                                                          Angel Waller is an 77 y.o. female with a history of HTN, complete heart block, pacemaker, aortic valve replacement, migraines and HLD who initially presented to the [facility] ED with acute onset of AMS. CT head revealed a subacute right MCA territory stroke. She has been transferred to [facility] for stroke work up. Home medications include ASA and atorvastatin. She has no prior history of stroke.   Past Medical History:  Diagnosis Date   Allergy    Arthritis    "right knee" (02/29/2012)   Blood transfusion without reported diagnosis    Complete heart block ([place])    Heart murmur    Hyperlipemia    Migraines    "occasionally" (02/29/2012)   Osteopenia    Pacemaker    S/P AVR (aortic valve replacement)    July 2010   Seasonal allergies     Past Surgical History:  Procedure Laterality Date   ABDOMINAL HYSTERECTOMY     CARDIAC VALVE REPLACEMENT  2010   tissue aortic valve; [place]   COLONOSCOPY  2005   negative; [place], [place]   INSERT / REPLACE / REMOVE PACEMAKER     KNEE ARTHROSCOPY Right    PERMANENT PACEMAKER INSERTION  03/02/2012   Medtronic Adapta L implanted by Dr Rayann Heman   PERMANENT PACEMAKER INSERTION N/A 03/02/2012   Procedure: PERMANENT PACEMAKER INSERTION;  Surgeon: Thompson Grayer, MD;  Location: [facility] C[facility] LAB;  Service: Cardiovascular;  Laterality: N/A;   POLYPECTOMY     TEMPORARY PACEMAKER INSERTION N/A 03/01/2012   Procedure: TEMPORARY PACEMAKER INSERTION;  Surgeon: Burnell Blanks, MD;  Location: [facility] CATH LAB;  Service: Cardiovascular;  Laterality: N/A;   TONSILLECTOMY AND ADENOIDECTOMY  1962   TOTAL KNEE ARTHROPLASTY Right 01/07/2014    Procedure: RIGHT TOTAL KNEE ARTHROPLASTY;  Surgeon: Mauri Pole, MD;  Location: [facility] ORS;  Service: Orthopedics;  Laterality: Right;    Family History  Problem Relation Age of Onset   Heart attack Mother 51       CABG   Heart attack Father 19       aneurysm post MI   Colon polyps Father    Heart disease Other        in both M & P uncles & aunts; no premature MI   Diabetes Neg Hx    Stroke Neg Hx    Colon cancer Neg Hx    Pancreatic  cancer Neg Hx    Rectal cancer Neg Hx    Stomach cancer Neg Hx             Social History:  reports that she quit smoking about 15 years ago. Her smoking use included cigarettes. She has a 21.00 pack-year smoking history. She has never used smokeless tobacco. She reports that she does not drink alcohol and does not use drugs.  Allergies  Allergen Reactions   Amoxicillin Swelling    MEDICATIONS:                                                                                                                     Prior to Admission:  Medications Prior to Admission  Medication Sig Dispense Refill Last Dose   acetaminophen (TYLENOL) 500 MG tablet Take 1,000 mg by mouth as needed for moderate pain.   04/21/2022   aspirin 81 MG EC tablet Take 81 mg by mouth daily.   04/21/2022   atorvastatin (LIPITOR) 40 MG tablet TAKE 1 TABLET BY MOUTH DAILY (Patient taking differently: Take 40 mg by mouth daily.) 90 tablet 1 04/21/2022   Cholecalciferol 25 MCG (1000 UT) capsule Take 1,000 Units by mouth daily.   04/21/2022   clindamycin (CLEOCIN) 300 MG capsule For dental appt. Only used for dental appointments   unk   estradiol (ESTRACE) 2 MG tablet Take 2 mg by mouth daily.    04/21/2022   losartan (COZAAR) 25 MG tablet Take 25 mg by mouth daily.    04/21/2022   Multiple Vitamins-Minerals (PRESERVISION AREDS PO) Take 1 tablet by mouth in the morning and at bedtime.   04/21/2022   omega-3 acid ethyl esters (LOVAZA) 1 g capsule Take 1 capsule by mouth 2 (two) times daily.    04/21/2022   torsemide (DEMADEX) 10 MG tablet Take 10 mg by mouth daily.    04/21/2022   tretinoin microspheres (RETIN-A MICRO) 0.1 % gel Apply 1 Application topically at bedtime.   04/21/2022   Scheduled:   stroke: early stages of recovery book   Does not apply Once   aspirin EC  81 mg Oral Daily   atorvastatin  40 mg Oral Daily   clopidogrel  75 mg Oral Daily   enoxaparin (LOVENOX) injection  40 mg Subcutaneous Q24H   Continuous:  sodium chloride       ROS:  Does not endorse any vision loss, confusion, difficulty with speech generation or comprehension, ataxia or right sided weakness. Other ROS as per HPI.    Blood pressure 126/60, pulse 66, temperature 98.1 F (36.7 C), temperature source Oral, resp. rate 20, SpO2 100 %.   General Examination:                                                                                                       Physical Exam  HEENT-  [place]/AT   Lungs- Respirations unlabored Extremities- No edema   Neurological Examination Mental Status: Awake and alert. Oriented x 5. Subtle dysarthria is intermittently noted. Otherwise, speech is fluent with intact naming and comprehension.  Cranial Nerves: II: Temporal visual fields intact with no extinction to DSS. PERRL.   III,IV, VI: No ptosis. EOMI. No nystagmus.  V: Temp sensation equal bilaterally VII: Left facial droop.  VIII: Hearing intact to voice IX,X: No hypophonia or hoarseness XI: Symmetric XII: Tongue deviates to the left when protruded Motor: RUE 5/5 proximally and distally LUE 5/5 except for deltoid and triceps which are 4/5 RLE 5/5 LLE 4+/5 hip flexion, otherwise 5/5 No pronator drift Negative orbiting fingers test.  Sensory: Temp and light touch intact throughout, bilaterally. Positive for extinction to LUE with DSS.  Deep Tendon Reflexes: 2+ and  symmetric throughout Cerebellar: No ataxia with FNF bilaterally  Gait: Deferred   Lab Results: Basic Metabolic Panel: Recent Labs  Lab 04/22/22 1900  NA 134*  K 3.4*  CL 102  CO2 23  GLUCOSE 90  BUN 22  CREATININE 0.81  CALCIUM 8.7*    CBC: Recent Labs  Lab 04/22/22 1900  WBC 6.6  NEUTROABS 3.7  HGB 12.0  HCT 36.0  MCV 90.2  PLT 184    Cardiac Enzymes: No results for input(s): "CKTOTAL", "CKMB", "CKMBINDEX", "TROPONINI" in the last 168 hours.  Lipid Panel: No results for input(s): "CHOL", "TRIG", "HDL", "CHOLHDL", "VLDL", "LDLCALC" in the last 168 hours.  Imaging: CT ANGIO HEAD NECK W WO CM  Result Date: 04/22/2022 CLINICAL DATA:  Stroke, follow up EXAM: CT ANGIOGRAPHY HEAD AND NECK TECHNIQUE: Multidetector CT imaging of the head and neck was performed using the standard protocol during bolus administration of intravenous contrast. Multiplanar CT image reconstructions and MIPs were obtained to evaluate the vascular anatomy. Carotid stenosis measurements (when applicable) are obtained utilizing NASCET criteria, using the distal internal carotid diameter as the denominator. RADIATION DOSE REDUCTION: This exam was performed according to the departmental dose-optimization program which includes automated exposure control, adjustment of the mA and/or kV according to patient size and/or use of iterative reconstruction technique. CONTRAST:  26m OMNIPAQUE IOHEXOL 350 MG/ML SOLN COMPARISON:  Same day CT head. FINDINGS: CTA NECK FINDINGS Aortic arch: Great vessel origins are patent. Calcific atherosclerosis of the aorta and great vessels. Right carotid system: Atherosclerosis at the carotid bifurcation without greater than 50% stenosis. Left carotid system: Atherosclerosis at the carotid bifurcation without greater than 50% stenosis Vertebral arteries: Left dominant. Both vertebral arteries are patent without significant (greater than 50%) stenosis. No acute findings.  Skeleton: No  acute findings. Other neck: No acute findings. Upper chest: Emphysema. No consolidation the visualized lung apices. Review of the MIP images confirms the above findings CTA HEAD FINDINGS Anterior circulation: Bilateral intracranial ICAs are patent without significant stenosis. Filling defect within the distal right M1 MCA that extends into proximal M2 MCA branches, compatible with thrombus. Resulting severe stenosis, nearly occlusive. Left MCA and bilateral ACAs are patent without proximal high-grade stenosis. Posterior circulation: Bilateral intradural vertebral arteries, basilar artery and bilateral posterior cerebral arteries are patent without proximal high-grade stenosis. Venous sinuses: As permitted by contrast timing, patent. Review of the MIP images confirms the above findings IMPRESSION: Filling defect within the distal right M1 MCA that extends into proximal M2 MCA branches, compatible with thrombus. Resulting severe stenosis, nearly occlusive. Findings discussed with Dr. Oswald Hillock via telephone at 8:53 PM. Electronically Signed   By: Margaretha Sheffield M.D.   On: 04/22/2022 20:56   CT HEAD WO CONTRAST  Result Date: 04/22/2022 CLINICAL DATA:  Altered mental status. EXAM: CT HEAD WITHOUT CONTRAST TECHNIQUE: Contiguous axial images were obtained from the base of the skull through the vertex without intravenous contrast. RADIATION DOSE REDUCTION: This exam was performed according to the departmental dose-optimization program which includes automated exposure control, adjustment of the mA and/or kV according to patient size and/or use of iterative reconstruction technique. COMPARISON:  Head CT dated 02/29/2012. FINDINGS: Brain: Mild age-related atrophy and chronic microvascular ischemic changes. A hypoattenuating area in the right insula consistent with an age indeterminate right MCA territory infarct, possibly subacute or chronic. Acute infarct is less likely but not excluded. There is no acute  intracranial hemorrhage. No mass effect or midline shift. No extra-axial fluid collection. Vascular: Question slight higher attenuation of M1 segment of the right MCA (coronal 41/4). Further evaluation with CT angiography is recommended. Skull: Normal. Negative for fracture or focal lesion. Sinuses/Orbits: No acute finding. Other: None IMPRESSION: 1. No acute intracranial hemorrhage. 2. Age indeterminate right MCA territory infarct, likely subacute or chronic. Correlation with clinical exam and further evaluation with MRI or CT angiography is recommended. 3. Question slight higher attenuation of the M1 segment of the right MCA. These results were called by telephone at the time of interpretation on 04/22/2022 at 7:36 pm to provider [facility] , who verbally acknowledged these results. Electronically Signed   By: Anner Crete M.D.   On: 04/22/2022 19:43     Assessment: 77 year old female with a history of HTN, aortic valve replacement, pacemaker and HLD who initially presented to the [facility] ED with acute onset of AMS. CT head revealed a subacute right MCA territory stroke. She has been transferred to [facility] for stroke work up.   - Exam reveals findings referable to the right MCA territory ischemic infarction seen on CT - CTA of head and neck: Filling defect within the distal right M1 MCA that extends into proximal M2 MCA branches, compatible with thrombus. Resulting severe stenosis, nearly occlusive.  - CT head: No acute intracranial hemorrhage. Age indeterminate right MCA territory infarct, likely subacute or chronic. Question slight higher attenuation of the M1 segment of the right MCA. - Stroke risk factors: HTN and HLD - DDx for stroke mechanism: Primarily atherothrombotic versus cardioembolic  Recommendations: 1. HgbA1c, fasting lipid panel 2. The patient states that her pacemaker is not MRI compatible 3. PT consult, OT consult, Speech consult 4. TTE 5. Continue atorvastatin 6. Has failed ASA  monotherapy. Will need to be on DAPT with ASA and  Plavix indefinitely.  7. Risk factor modification 8. Telemetry monitoring 9. Frequent neuro checks 10. NPO until passes stroke swallow screen 11. BP management per standard protocol. Out of the permissive HTN time window.    Electronically signed: Dr. Kerney Elbe 04/23/2022, 12:59 PM

## 2022-04-23 NOTE — ED Notes (Signed)
Patient ambulated to the bathroom with assistance.

## 2022-04-23 NOTE — ED Notes (Signed)
Report given to [facility]  °

## 2022-04-23 NOTE — H&P (Signed)
History and Physical    Patient: Angel Waller WUJ:[id number] DOB: 1945/05/30 DOA: 04/22/2022 DOS: the patient was seen and examined on 04/23/2022 PCP: Midge Minium, MD  Patient coming from: Home - lives with husband; NOK: [place], [place], [phone]   Chief Complaint: Facial droop  HPI: Angel Waller is a 77 y.o. female with medical history significant of s/p AVR; HLD; and pacemaker placement presenting with facial droop. Her husband provided the majority of the history, as the patient did not think there was anything wrong with her during the episode.  On Wednesday (1/24), they were going to dinner with friends.  They do this regularly but this time the patient wasn't ready at the appointed time and when she showed up late her hair wasn't combed (very out of character).  They went to dinner and she had difficulty getting into the truck - the husband reports that she was uncoordinated, although the patient reports that her shoe bottoms were slick from rain.  Their friends noticed that she was acting oddly compared to her usual.  They went home to bed and the next morning she seemed to be ok.  Her husband was scheduled to go on a trip and she told him to go so he drove to [facility]  However, shortly thereafter her daughter came over and was concerned about this story and so convinced the patient to come to the ER - her husband then drove back home.    ER Course:  [facility] to [facility] transfer, per Dr. Velia Meyer:  Altered mental status, delirium associated with facial droop that started abruptly at 1700 yesterday (1/24) while at dinner with friends - she was noted to be behaving erratically, spilling drinks.  The patient was transported the patient back home where she went to sleep and upon awakening the next morning (morning of 1/25), she was noted to be back to her baseline mental status and with interval resolution in her facial droop.  However, upon hearing of the above incident at  dinner occurring on 04/21/2022, the patient's daughter brought the patient to [place] emergency department today for further evaluation.  Ensuing CT head showed evidence of right MCA territory infarct.   She remained at her baseline mental status throughout the course at [facility], with no evidence of acute focal neurologic deficits, and NIH noted to be 0.   EDP d/w case/imaging with on-call neurology, Dr. Rory Percy, Who conveyed that the patient is outside of the window for tPA/thrombectomy, and recommended admission to the hospitalist service at [facility] for further evaluation/management of acute ischemic stroke.    Given Aspirin 650 mg p.o. x 1, Plavix 3 mg p.o. x 1.     Review of Systems: As mentioned in the history of present illness. All other systems reviewed and are negative. Past Medical History:  Diagnosis Date   Allergy    Arthritis    "right knee" (02/29/2012)   Blood transfusion without reported diagnosis    Complete heart block ([place])    Heart murmur    Hyperlipemia    Migraines    "occasionally" (02/29/2012)   Osteopenia    Pacemaker    S/P AVR (aortic valve replacement)    July 2010   Seasonal allergies    Past Surgical History:  Procedure Laterality Date   ABDOMINAL HYSTERECTOMY     CARDIAC VALVE REPLACEMENT  2010   tissue aortic valve; [place]   COLONOSCOPY  2005   negative;  [place], [place]   INSERT / REPLACE / REMOVE PACEMAKER     KNEE ARTHROSCOPY Right    PERMANENT PACEMAKER INSERTION  03/02/2012   Medtronic Adapta L implanted by Dr Rayann Heman   PERMANENT PACEMAKER INSERTION N/A 03/02/2012   Procedure: PERMANENT PACEMAKER INSERTION;  Surgeon: Thompson Grayer, MD;  Location: [facility] CATH LAB;  Service: Cardiovascular;  Laterality: N/A;   POLYPECTOMY     TEMPORARY PACEMAKER INSERTION N/A 03/01/2012   Procedure: TEMPORARY PACEMAKER INSERTION;  Surgeon: Burnell Blanks, MD;  Location: [facility] CATH LAB;  Service: Cardiovascular;  Laterality: N/A;    TONSILLECTOMY AND ADENOIDECTOMY  1962   TOTAL KNEE ARTHROPLASTY Right 01/07/2014   Procedure: RIGHT TOTAL KNEE ARTHROPLASTY;  Surgeon: Mauri Pole, MD;  Location: [facility] ORS;  Service: Orthopedics;  Laterality: Right;   Social History:  reports that she quit smoking about 15 years ago. Her smoking use included cigarettes. She has a 21.00 pack-year smoking history. She has never used smokeless tobacco. She reports that she does not drink alcohol and does not use drugs.  Allergies  Allergen Reactions   Amoxicillin Swelling    Family History  Problem Relation Age of Onset   Heart attack Mother 70       CABG   Heart attack Father 22       aneurysm post MI   Colon polyps Father    Heart disease Other        in both M & P uncles & aunts; no premature MI   Diabetes Neg Hx    Stroke Neg Hx    Colon cancer Neg Hx    Pancreatic cancer Neg Hx    Rectal cancer Neg Hx    Stomach cancer Neg Hx     Prior to Admission medications   Medication Sig Start Date End Date Taking? Authorizing Provider  aspirin 81 MG EC tablet Take 1 tablet by mouth daily.    [provider]  atorvastatin (LIPITOR) 40 MG tablet TAKE 1 TABLET BY MOUTH DAILY 05/30/17   Midge Minium, MD  Cholecalciferol 25 MCG (1000 UT) capsule Take 2,000 Units by mouth daily.    [provider]  clindamycin (CLEOCIN) 300 MG capsule For dental appt 08/13/19   [provider]  estradiol (ESTRACE) 2 MG tablet Take 2 mg by mouth daily.  11/27/18   [provider]  losartan (COZAAR) 25 MG tablet Take 25 mg by mouth daily.  11/15/18   [provider]  senna (SENOKOT) 8.6 MG TABS tablet Take 2 tablets by mouth 2 (two) times daily as needed for mild constipation.    [provider]  torsemide (DEMADEX) 10 MG tablet Take 10 mg by mouth daily.  11/15/18   [provider]  tretinoin microspheres (RETIN-A MICRO) 0.1 % gel Apply at bedtime as directed 07/17/08   [provider]     Physical Exam: Vitals:   04/23/22 0400 04/23/22 0700 04/23/22 0930 04/23/22 1102  BP: 121/63 (!) 109/51 130/60 126/60  Pulse: 61 70 (!) 59 66  Resp: '14 17 16 20  '$ Temp:   98.1 F (36.7 C) 98.1 F (36.7 C)  TempSrc:    Oral  SpO2: 94% 94% 96% 100%   General:  Appears calm and comfortable and is in NAD Eyes:   EOMI, normal lids, iris ENT:  grossly normal hearing, lips & tongue, mmm; appropriate dentition Neck:  no LAD, masses or thyromegaly Cardiovascular:  RRR, no m/r/g. No LE edema.  Respiratory:  CTA bilaterally with no wheezes/rales/rhonchi.  Normal respiratory effort. Abdomen:  soft, NT, ND Skin:  no rash or induration seen on limited exam Musculoskeletal:  grossly normal tone BUE/BLE with subtle LLE weakness, good ROM, no bony abnormality Psychiatric:  blunted/annoyed mood and affect, speech fluent and appropriate, AOx3 Neurologic:  CN 2-12 grossly intact with subtle L facial droop, moves all extremities in coordinated fashion with subtle LLE weakness, sensation intact   Radiological Exams on Admission: Independently reviewed - see discussion in A/P where applicable  CT ANGIO HEAD NECK W WO CM  Result Date: 04/22/2022 CLINICAL DATA:  Stroke, follow up EXAM: CT ANGIOGRAPHY HEAD AND NECK TECHNIQUE: Multidetector CT imaging of the head and neck was performed using the standard protocol during bolus administration of intravenous contrast. Multiplanar CT image reconstructions and MIPs were obtained to evaluate the vascular anatomy. Carotid stenosis measurements (when applicable) are obtained utilizing NASCET criteria, using the distal internal carotid diameter as the denominator. RADIATION DOSE REDUCTION: This exam was performed according to the departmental dose-optimization program which includes automated exposure control, adjustment of the mA and/or kV according to patient size and/or use of iterative reconstruction technique. CONTRAST:  14m OMNIPAQUE IOHEXOL 350 MG/ML SOLN  COMPARISON:  Same day CT head. FINDINGS: CTA NECK FINDINGS Aortic arch: Great vessel origins are patent. Calcific atherosclerosis of the aorta and great vessels. Right carotid system: Atherosclerosis at the carotid bifurcation without greater than 50% stenosis. Left carotid system: Atherosclerosis at the carotid bifurcation without greater than 50% stenosis Vertebral arteries: Left dominant. Both vertebral arteries are patent without significant (greater than 50%) stenosis. No acute findings. Skeleton: No acute findings. Other neck: No acute findings. Upper chest: Emphysema. No consolidation the visualized lung apices. Review of the MIP images confirms the above findings CTA HEAD FINDINGS Anterior circulation: Bilateral intracranial ICAs are patent without significant stenosis. Filling defect within the distal right M1 MCA that extends into proximal M2 MCA branches, compatible with thrombus. Resulting severe stenosis, nearly occlusive. Left MCA and bilateral ACAs are patent without proximal high-grade stenosis. Posterior circulation: Bilateral intradural vertebral arteries, basilar artery and bilateral posterior cerebral arteries are patent without proximal high-grade stenosis. Venous sinuses: As permitted by contrast timing, patent. Review of the MIP images confirms the above findings IMPRESSION: Filling defect within the distal right M1 MCA that extends into proximal M2 MCA branches, compatible with thrombus. Resulting severe stenosis, nearly occlusive. Findings discussed with Dr. COswald Hillockvia telephone at 8:53 PM. Electronically Signed   By: FMargaretha SheffieldM.D.   On: 04/22/2022 20:56   CT HEAD WO CONTRAST  Result Date: 04/22/2022 CLINICAL DATA:  Altered mental status. EXAM: CT HEAD WITHOUT CONTRAST TECHNIQUE: Contiguous axial images were obtained from the base of the skull through the vertex without intravenous contrast. RADIATION DOSE REDUCTION: This exam was performed according to the departmental  dose-optimization program which includes automated exposure control, adjustment of the mA and/or kV according to patient size and/or use of iterative reconstruction technique. COMPARISON:  Head CT dated 02/29/2012. FINDINGS: Brain: Mild age-related atrophy and chronic microvascular ischemic changes. A hypoattenuating area in the right insula consistent with an age indeterminate right MCA territory infarct, possibly subacute or chronic. Acute infarct is less likely but not excluded. There is no acute intracranial hemorrhage. No mass effect or midline shift. No extra-axial fluid collection. Vascular: Question slight higher attenuation of M1 segment of the right MCA (coronal 41/4). Further evaluation with CT angiography is recommended. Skull: Normal. Negative for fracture or focal lesion. Sinuses/Orbits: No  acute finding. Other: None IMPRESSION: 1. No acute intracranial hemorrhage. 2. Age indeterminate right MCA territory infarct, likely subacute or chronic. Correlation with clinical exam and further evaluation with MRI or CT angiography is recommended. 3. Question slight higher attenuation of the M1 segment of the right MCA. These results were called by telephone at the time of interpretation on 04/22/2022 at 7:36 pm to provider [facility] , who verbally acknowledged these results. Electronically Signed   By: Anner Crete M.D.   On: 04/22/2022 19:43    EKG: Independently reviewed.  NSR with rate 73; prolonged QTc 535; IVCD; nonspecific ST changes with no evidence of acute ischemia   Labs on Admission: I have personally reviewed the available labs and imaging studies at the time of the admission.  Pertinent labs:    K+ 3.4 Albumin 3.2 Normal CBC UA: trace Hgb, 15 ketones, moderate LE, many bacteria   Assessment and Plan: Principal Problem:   Acute ischemic right MCA stroke (HCC) Active Problems:   Hyperlipidemia   Pacemaker-Medtronic   Chronic diastolic (congestive) heart failure (HCC)    History of aortic valve replacement with bioprosthetic valve   Essential hypertension    Assessment and Plan: No notes have been filed under this hospital service. Service: Hospitalist   CVA -Symptoms concerning for TIA/CVA -Aspirin has been given to reduce stroke mortality and decrease morbidity -Will place in observation status for further CVA evaluation -Telemetry monitoring -MRI/MRA cannot be performed due to pacemaker -CT positive for subacute/chronic R MCA CVA -CTA with distal R M1 MCA thrombus with severe stenosis, nearly occlusive -Echo on 04/09/22 with preserved EF, grade 2 diastolic dysfunction, bioprosthetic AoV, mild diffuse valvular regurgitation, no significant change from prior; will not plan to repeat at this time. -Risk stratification with FLP, A1c; will also check UDS -Patient will need DAPT for 21 days when ABCD2 score is at least 4 and NIH score is 3 or less, and then can transition to monotherapy with a single antiplatelet agent.  Since this patient has failed primary prevention with ASA, transition to Plavix appears to be reasonable.  Will defer to neurology for now. -Consider thrombectomy if there is persistent disabling neurologic deficit associated with a vascular cut-off -Neurology consult -PT/OT/ST/Nutrition Consults -Hold estradiol, as this is associated with increased thromboembolic risk  HTN -Allow permissive HTN for now -Treat BP only if >220/120, and then with goal of 15% reduction -Hold ARB and plan to restart in 48-72 hours   HLD -Check FLP -Continue Lipitor 40 mg daily -Hold Lovaza due to limited inpatient utility   Chronic diastolic CHF -Appears euvolemic at this time -Hold torsemide for now        Advance Care Planning:   Code Status: Full Code  - Code status was discussed with the patient and/or family at the time of admission.  The patient would want to receive full resuscitative measures at this time.   Consults: Neurology;  PT/OT/ST; nutrition; TOC team  DVT Prophylaxis: Lovenox  Family Communication: Husband was present throughout evaluation  Severity of Illness: The appropriate patient status for this patient is OBSERVATION. Observation status is judged to be reasonable and necessary in order to provide the required intensity of service to ensure the patient's safety. The patient's presenting symptoms, physical exam findings, and initial radiographic and laboratory data in the context of their medical condition is felt to place them at decreased risk for further clinical deterioration. Furthermore, it is anticipated that the patient will be medically stable for discharge  from the hospital within 2 midnights of admission.   Author: Karmen Bongo, MD 04/23/2022 1:20 PM  For on call review [email or web address].

## 2022-04-23 NOTE — ED Notes (Signed)
ED TO INPATIENT HANDOFF REPORT  ED Nurse Name and Phone # Berneta Sages RN  S Name/Age/Gender Angel Waller 77 y.o. female Room/Bed: MH11/MH11  Code Status   Code Status: Prior  Home/SNF/Other Home Patient oriented to: self, place, time, and situation Is this baseline? Yes   Triage Complete: Triage complete  Chief Complaint Acute ischemic right MCA stroke [facility]) [I63.511]  Triage Note Pt presents with daughter who states pt had odd behavior noted first last night about 1730.  PT was late to dinner.  Had appearance of being intoxicated but does not drink.  Daughter feels left side of face is drooping but pt herself states this is her normal.  Pt denies any need to be evaluated but consents to make her family feel less anxious.  NIH done in triage is 0.     Allergies Allergies  Allergen Reactions   Amoxicillin Swelling    Level of Care/Admitting Diagnosis ED Disposition     ED Disposition  Admit   Condition  --   Comment  Hospital Area: [facility] [[id number]]  Level of Care: Telemetry Medical [104]  Interfacility transfer: Yes  May place patient in observation at [facility] or [place] if equivalent level of care is available:: No  Covid Evaluation: Asymptomatic - no recent exposure (last 10 days) testing not required  Diagnosis: Acute ischemic right MCA stroke [facility]) [[id number]]  Admitting Physician: Rhetta Mura [[id number]]  Attending Physician: Rhetta Mura [[id number]]          B Medical/Surgery History Past Medical History:  Diagnosis Date   Allergy    Aortic stenosis    Arthritis    "right knee" (02/29/2012)   Bicuspid aortic valve 09/2008   STATUS POST BIOPROSTHETIC AORTIC VALVE REPLACEMENT   Blood transfusion without reported diagnosis    Complete heart block ([place])    Heart murmur    Hyperlipemia    Migraines    "occasionally" (02/29/2012)   Osteopenia    Pacemaker    S/P AVR (aortic valve replacement)    July 2010    Seasonal allergies    Past Surgical History:  Procedure Laterality Date   ABDOMINAL HYSTERECTOMY     CARDIAC VALVE REPLACEMENT  2010   tissue aortic valve; [place]   COLONOSCOPY  2005   negative; [place], [place]   INSERT / REPLACE / REMOVE PACEMAKER     KNEE ARTHROSCOPY Right    PERMANENT PACEMAKER INSERTION  03/02/2012   Medtronic Adapta L implanted by Dr Rayann Heman   PERMANENT PACEMAKER INSERTION N/A 03/02/2012   Procedure: PERMANENT PACEMAKER INSERTION;  Surgeon: Thompson Grayer, MD;  Location: [facility] CATH LAB;  Service: Cardiovascular;  Laterality: N/A;   POLYPECTOMY     TEMPORARY PACEMAKER INSERTION N/A 03/01/2012   Procedure: TEMPORARY PACEMAKER INSERTION;  Surgeon: Burnell Blanks, MD;  Location: [facility] CATH LAB;  Service: Cardiovascular;  Laterality: N/A;   TONSILLECTOMY AND ADENOIDECTOMY  1962   TOTAL KNEE ARTHROPLASTY Right 01/07/2014   Procedure: RIGHT TOTAL KNEE ARTHROPLASTY;  Surgeon: Mauri Pole, MD;  Location: [facility] ORS;  Service: Orthopedics;  Laterality: Right;     A IV Location/Drains/Wounds Patient Lines/Drains/Airways Status     Active Line/Drains/Airways     Name Placement date Placement time Site Days   Peripheral IV 04/22/22 18 G Anterior;Left;Proximal Forearm 04/22/22  1853  Forearm  1   PICC Double Lumen 00/34/91 PICC Right Basilic 38 cm 1 cm 79/15/05  0840  -- 1260   Incision (  Closed) 11/10/18 Sternum Medial 11/10/18  0015  -- 1260            Intake/Output Last 24 hours No intake or output data in the 24 hours ending 04/23/22 0919  Labs/Imaging Results for orders placed or performed during the hospital encounter of 04/22/22 (from the past 48 hour(s))  CBG monitoring, ED     Status: None   Collection Time: 04/22/22  6:54 PM  Result Value Ref Range   Glucose-Capillary 96 70 - 99 mg/dL    Comment: Glucose reference range applies only to samples taken after fasting for at least 8 hours.  Protime-INR     Status: None   Collection Time: 04/22/22  7:00 PM   Result Value Ref Range   Prothrombin Time 13.4 11.4 - 15.2 seconds   INR 1.0 0.8 - 1.2    Comment: (NOTE) INR goal varies based on device and disease states. Performed at [facility], [address] [place], [place] [address]   APTT     Status: None   Collection Time: 04/22/22  7:00 PM  Result Value Ref Range   aPTT 26 24 - 36 seconds    Comment: Performed at [facility], [address] [place], [place] [address]  CBC     Status: None   Collection Time: 04/22/22  7:00 PM  Result Value Ref Range   WBC 6.6 4.0 - 10.5 K/uL   RBC 3.99 3.87 - 5.11 MIL/uL   Hemoglobin 12.0 12.0 - 15.0 g/dL   HCT 36.0 36.0 - 46.0 %   MCV 90.2 80.0 - 100.0 fL   MCH 30.1 26.0 - 34.0 pg   MCHC 33.3 30.0 - 36.0 g/dL   RDW 13.0 11.5 - 15.5 %   Platelets 184 150 - 400 K/uL   nRBC 0.0 0.0 - 0.2 %    Comment: Performed at [facility], [address] [place], [place] [address]  Differential     Status: None   Collection Time: 04/22/22  7:00 PM  Result Value Ref Range   Neutrophils Relative % 56 %   Neutro Abs 3.7 1.7 - 7.7 K/uL   Lymphocytes Relative 32 %   Lymphs Abs 2.1 0.7 - 4.0 K/uL   Monocytes Relative 9 %   Monocytes Absolute 0.6 0.1 - 1.0 K/uL   Eosinophils Relative 2 %   Eosinophils Absolute 0.1 0.0 - 0.5 K/uL   Basophils Relative 1 %   Basophils Absolute 0.0 0.0 - 0.1 K/uL   Immature Granulocytes 0 %   Abs Immature Granulocytes 0.01 0.00 - 0.07 K/uL    Comment: Performed at [facility], [address] [place], [place] [address]  Comprehensive metabolic panel     Status: Abnormal   Collection Time: 04/22/22  7:00 PM  Result Value Ref Range   Sodium 134 (L) 135 - 145 mmol/L   Potassium 3.4 (L) 3.5 - 5.1 mmol/L   Chloride 102 98 - 111 mmol/L   CO2 23 22 - 32 mmol/L   Glucose, Bld 90 70 - 99 mg/dL    Comment: Glucose reference range applies only to samples taken after fasting for at least 8 hours.   BUN 22 8 - 23 mg/dL   Creatinine, Ser  0.81 0.44 - 1.00 mg/dL   Calcium 8.7 (L) 8.9 - 10.3 mg/dL   Total Protein 7.5 6.5 - 8.1 g/dL   Albumin 3.2 (L) 3.5 - 5.0 g/dL  AST 24 15 - 41 U/L   ALT 13 0 - 44 U/L   Alkaline Phosphatase 54 38 - 126 U/L   Total Bilirubin 0.7 0.3 - 1.2 mg/dL   GFR, Estimated >60 >60 mL/min    Comment: (NOTE) Calculated using the CKD-EPI Creatinine Equation (2021)    Anion gap 9 5 - 15    Comment: Performed at [facility], [address] [place], [place] [address]  Ethanol     Status: None   Collection Time: 04/22/22  7:00 PM  Result Value Ref Range   Alcohol, Ethyl (B) <10 <10 mg/dL    Comment: (NOTE) Lowest detectable limit for serum alcohol is 10 mg/dL.  For medical purposes only. Performed at [facility], [address] [place], [place] [address]   Urinalysis, Routine w reflex microscopic -Urine, Clean Catch     Status: Abnormal   Collection Time: 04/22/22  8:50 PM  Result Value Ref Range   Color, Urine YELLOW YELLOW   APPearance CLOUDY (A) CLEAR   Specific Gravity, Urine 1.010 1.005 - 1.030   pH 6.0 5.0 - 8.0   Glucose, UA NEGATIVE NEGATIVE mg/dL   Hgb urine dipstick TRACE (A) NEGATIVE   Bilirubin Urine NEGATIVE NEGATIVE   Ketones, ur 15 (A) NEGATIVE mg/dL   Protein, ur NEGATIVE NEGATIVE mg/dL   Nitrite NEGATIVE NEGATIVE   Leukocytes,Ua MODERATE (A) NEGATIVE    Comment: Performed at [facility], [address] [place], [place] [address]  Urinalysis, Microscopic (reflex)     Status: Abnormal   Collection Time: 04/22/22  8:50 PM  Result Value Ref Range   RBC / HPF 0-5 0 - 5 RBC/hpf   WBC, UA 11-20 0 - 5 WBC/hpf   Bacteria, UA MANY (A) NONE SEEN   Squamous Epithelial / HPF 0-5 0 - 5 /HPF   WBC Clumps PRESENT     Comment: Performed at [facility], [address] [place], [place] [address]   CT ANGIO HEAD NECK W WO CM  Result Date: 04/22/2022 CLINICAL DATA:  Stroke, follow up EXAM: CT ANGIOGRAPHY HEAD AND NECK TECHNIQUE:  Multidetector CT imaging of the head and neck was performed using the standard protocol during bolus administration of intravenous contrast. Multiplanar CT image reconstructions and MIPs were obtained to evaluate the vascular anatomy. Carotid stenosis measurements (when applicable) are obtained utilizing NASCET criteria, using the distal internal carotid diameter as the denominator. RADIATION DOSE REDUCTION: This exam was performed according to the departmental dose-optimization program which includes automated exposure control, adjustment of the mA and/or kV according to patient size and/or use of iterative reconstruction technique. CONTRAST:  68m OMNIPAQUE IOHEXOL 350 MG/ML SOLN COMPARISON:  Same day CT head. FINDINGS: CTA NECK FINDINGS Aortic arch: Great vessel origins are patent. Calcific atherosclerosis of the aorta and great vessels. Right carotid system: Atherosclerosis at the carotid bifurcation without greater than 50% stenosis. Left carotid system: Atherosclerosis at the carotid bifurcation without greater than 50% stenosis Vertebral arteries: Left dominant. Both vertebral arteries are patent without significant (greater than 50%) stenosis. No acute findings. Skeleton: No acute findings. Other neck: No acute findings. Upper chest: Emphysema. No consolidation the visualized lung apices. Review of the MIP images confirms the above findings CTA HEAD FINDINGS Anterior circulation: Bilateral intracranial ICAs are patent without significant stenosis. Filling defect within the distal right M1 MCA that extends into proximal M2 MCA branches, compatible with thrombus. Resulting severe stenosis, nearly occlusive.  Left MCA and bilateral ACAs are patent without proximal high-grade stenosis. Posterior circulation: Bilateral intradural vertebral arteries, basilar artery and bilateral posterior cerebral arteries are patent without proximal high-grade stenosis. Venous sinuses: As permitted by contrast timing, patent.  Review of the MIP images confirms the above findings IMPRESSION: Filling defect within the distal right M1 MCA that extends into proximal M2 MCA branches, compatible with thrombus. Resulting severe stenosis, nearly occlusive. Findings discussed with Dr. Oswald Hillock via telephone at 8:53 PM. Electronically Signed   By: Margaretha Sheffield M.D.   On: 04/22/2022 20:56   CT HEAD WO CONTRAST  Result Date: 04/22/2022 CLINICAL DATA:  Altered mental status. EXAM: CT HEAD WITHOUT CONTRAST TECHNIQUE: Contiguous axial images were obtained from the base of the skull through the vertex without intravenous contrast. RADIATION DOSE REDUCTION: This exam was performed according to the departmental dose-optimization program which includes automated exposure control, adjustment of the mA and/or kV according to patient size and/or use of iterative reconstruction technique. COMPARISON:  Head CT dated 02/29/2012. FINDINGS: Brain: Mild age-related atrophy and chronic microvascular ischemic changes. A hypoattenuating area in the right insula consistent with an age indeterminate right MCA territory infarct, possibly subacute or chronic. Acute infarct is less likely but not excluded. There is no acute intracranial hemorrhage. No mass effect or midline shift. No extra-axial fluid collection. Vascular: Question slight higher attenuation of M1 segment of the right MCA (coronal 41/4). Further evaluation with CT angiography is recommended. Skull: Normal. Negative for fracture or focal lesion. Sinuses/Orbits: No acute finding. Other: None IMPRESSION: 1. No acute intracranial hemorrhage. 2. Age indeterminate right MCA territory infarct, likely subacute or chronic. Correlation with clinical exam and further evaluation with MRI or CT angiography is recommended. 3. Question slight higher attenuation of the M1 segment of the right MCA. These results were called by telephone at the time of interpretation on 04/22/2022 at 7:36 pm to provider [facility] , who verbally acknowledged these results. Electronically Signed   By: Anner Crete M.D.   On: 04/22/2022 19:43    Pending Labs Unresulted Labs (From admission, onward)    None       Vitals/Pain Today's Vitals   04/23/22 0300 04/23/22 0400 04/23/22 0700 04/23/22 0703  BP:  121/63 (!) 109/51   Pulse:  61 70   Resp:  14 17   Temp: 98.1 F (36.7 C)     TempSrc: Oral     SpO2:  94% 94%   PainSc:    0-No pain    Isolation Precautions No active isolations  Medications Medications  aspirin tablet 650 mg (650 mg Oral Given 04/22/22 2217)  iohexol (OMNIPAQUE) 350 MG/ML injection 75 mL (75 mLs Intravenous Contrast Given 04/22/22 2037)  clopidogrel (PLAVIX) tablet 300 mg (300 mg Oral Given 04/22/22 2217)    Mobility walks     Focused Assessments Neuro Assessment Handoff:  Swallow screen pass? Yes    NIH Stroke Scale  Dizziness Present: No Headache Present: No Interval: Shift assessment Level of Consciousness (1a.)   : Alert, keenly responsive LOC Questions (1b. )   : Answers both questions correctly LOC Commands (1c. )   : Performs both tasks correctly Best Gaze (2. )  : Normal Visual (3. )  : No visual loss Facial Palsy (4. )    : Minor paralysis Motor Arm, Left (5a. )   : No drift Motor Arm, Right (5b. ) : No drift Motor Leg, Left (6a. )  : No drift Motor Leg, Right (  6b. ) : No drift Limb Ataxia (7. ): Absent Sensory (8. )  : Normal, no sensory loss Best Language (9. )  : No aphasia Dysarthria (10. ): Normal Extinction/Inattention (11.)   : No Abnormality Complete NIHSS TOTAL: 1     Neuro Assessment: Within Defined Limits Neuro Checks:   Shift assessment (04/22/22 1900)  Has TPA been given? No If patient is a Neuro Trauma and patient is going to OR before floor call report to [place] nurse: [phone] or [phone]   R Recommendations: See Admitting Provider Note  Report given to:   Additional Notes:

## 2022-04-23 NOTE — Progress Notes (Signed)
Initial Nutrition Assessment  DOCUMENTATION CODES:   Not applicable  INTERVENTION:   Multivitamin w/ minerals daily Liberalize pt diet to regular due to increased needs. Encourage good PO intake  NUTRITION DIAGNOSIS:   Increased nutrient needs related to chronic illness (CHF) as evidenced by estimated needs.  GOAL:   Patient will meet greater than or equal to 90% of their needs  MONITOR:   PO intake, Labs, Weight trends, I & O's  REASON FOR ASSESSMENT:   Consult Assessment of nutrition requirement/status  ASSESSMENT:   77 y.o. female presented to [facility] ED with facial droop and altered mental status. PMH includes CHF, HLD, and HTN. Pt admitted with R MCA.   RD assessed pt laying in bed, husband at bedside. Pt reports that her appetite was good at home. Denies any recent weight loss. RD provided pt with menu and reviewed. Pt ordering lunch as RD left room. No questions at this time.   Medications reviewed.  Labs reviewed: Sodium 134, Potassium 3.4, Hgb A1c 5.5%  NUTRITION - FOCUSED PHYSICAL EXAM:  Deferred to follow-up.  Diet Order:   Diet Order             Diet regular Room service appropriate? Yes; Fluid consistency: Thin  Diet effective now                  EDUCATION NEEDS:   No education needs have been identified at this time  Skin:  Skin Assessment: Reviewed RN Assessment  Last BM:  Unknown  Height:   Ht Readings from Last 1 Encounters:  01/29/22 '5\' 4"'$  (1.626 m)    Weight:   Wt Readings from Last 1 Encounters:  01/29/22 63.6 kg    Ideal Body Weight:  54.6 kg  BMI:  There is no height or weight on file to calculate BMI.  Estimated Nutritional Needs:  Kcal:  1700-1900 Protein:  85-100 grams Fluid:  >/= 1.7 L   Hermina Barters RD, LDN Clinical Dietitian See [facility] for contact information.

## 2022-04-23 NOTE — ED Notes (Signed)
Patient ambulated to the bathroom with assistance. Patient is now back in bed. Husband is still at bedside.

## 2022-04-23 NOTE — ED Notes (Signed)
Called [facility] for Transport at 9:04

## 2022-04-23 NOTE — Evaluation (Signed)
Speech Language Pathology Evaluation Patient Details Name: Angel Waller MRN: [id number] DOB: [birth year] Today's Date: 04/23/2022 Time: 0973-5329 SLP Time Calculation (min) (ACUTE ONLY): 32 min  Problem List:  Patient Active Problem List   Diagnosis Date Noted   Essential hypertension 04/23/2022   Acute ischemic right MCA stroke ([place]) 04/22/2022   NSVT (nonsustained ventricular tachycardia) ([place]) 07/19/2019   History of COVID-19 06/19/2019   Chronic diastolic (congestive) heart failure ([place]) 11/09/2018   Women's annual routine gynecological examination 08/29/2015   History of aortic valve replacement with bioprosthetic valve 05/08/2014   Knee pain, right 04/01/2014   S/P right TKA 01/07/2014   Routine general medical examination at a health care facility 06/13/2013   Pacemaker-Medtronic 03/02/2012   Sinus pause 03/01/2012   Complete heart block ([place]) 03/01/2012   Syncope 02/29/2012   ALLERGIC RHINITIS, SEASONAL 02/24/2010   Hyperlipidemia 02/19/2009   Status post aortic valve replacement 10/15/2008   Past Medical History:  Past Medical History:  Diagnosis Date   Allergy    Arthritis    "right knee" (02/29/2012)   Blood transfusion without reported diagnosis    Complete heart block ([place])    Heart murmur    Hyperlipemia    Migraines    "occasionally" (02/29/2012)   Osteopenia    Pacemaker    S/P AVR (aortic valve replacement)    July 2010   Seasonal allergies    Past Surgical History:  Past Surgical History:  Procedure Laterality Date   ABDOMINAL HYSTERECTOMY     CARDIAC VALVE REPLACEMENT  2010   tissue aortic valve; [place]   COLONOSCOPY  2005   negative; [place], [place]   INSERT / REPLACE / REMOVE PACEMAKER     KNEE ARTHROSCOPY Right    PERMANENT PACEMAKER INSERTION  03/02/2012   Medtronic Adapta L implanted by Dr Rayann Heman   PERMANENT PACEMAKER INSERTION N/A 03/02/2012   Procedure: PERMANENT PACEMAKER INSERTION;  Surgeon: Thompson Grayer, MD;  Location: [facility] CATH LAB;   Service: Cardiovascular;  Laterality: N/A;   POLYPECTOMY     TEMPORARY PACEMAKER INSERTION N/A 03/01/2012   Procedure: TEMPORARY PACEMAKER INSERTION;  Surgeon: Burnell Blanks, MD;  Location: [facility] CATH LAB;  Service: Cardiovascular;  Laterality: N/A;   TONSILLECTOMY AND ADENOIDECTOMY  1962   TOTAL KNEE ARTHROPLASTY Right 01/07/2014   Procedure: RIGHT TOTAL KNEE ARTHROPLASTY;  Surgeon: Mauri Pole, MD;  Location: [facility]S;  Service: Orthopedics;  Laterality: Right;   HPI:  Pt is a 77 y.o. female who presented with facial droop. CT head negative. CT head: right MCA territory infarct. PMH: s/p AVR; HLD; and pacemaker placement   Assessment / Plan / Recommendation Clinical Impression  Pt participated in speech-language-cognition evaluation with her daughter present. 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 She reported that she lives with her husband and typically manages her medications and finances. Pt denied any baseline or acute deficits in speech, language, or cognition. Pt's daughter initially agreed, but reported following assessment she stated that the pt struggled more with some tasks than she typically would. [facility] Mental Status Examination was completed to evaluate the pt's cognitive-linguistic skills. She achieved a score of 25/30 which is below the normal limits of 27 or more out of 30 and is suggestive of a mild impairment. She exhibited difficulty in the areas of awareness, memory, and executive function. Skilled SLP services are clinically indicated at this time to improve cognitive-linguistic function.  SLP Assessment  SLP Recommendation/Assessment: Patient needs continued Speech [place] Pathology Services SLP Visit Diagnosis: Cognitive communication deficit (R41.841)    Recommendations for follow up therapy are one component of a multi-disciplinary discharge planning process, led by the attending physician.   Recommendations may be updated based on patient status, additional functional criteria and insurance authorization.    Follow Up Recommendations   (Continued SLP services at level of care recommended by PT/OT)    Assistance Recommended at Discharge  Intermittent Supervision/Assistance  Functional Status Assessment Patient has had a recent decline in their functional status and demonstrates the ability to make significant improvements in function in a reasonable and predictable amount of time.  Frequency and Duration min 2x/week  2 weeks      SLP Evaluation Cognition  Overall Cognitive Status: Impaired/Different from baseline Arousal/Alertness: Awake/alert Orientation Level: Oriented X4 Year: 2024 Month: January Day of Week: Correct Attention: Focused;Sustained Focused Attention: Appears intact Sustained Attention: Appears intact Memory: Impaired Memory Impairment: Storage deficit;Retrieval deficit (Immediate: 5/5; delayed: 3/5; with cues: 2/2) Awareness: Impaired Awareness Impairment: Intellectual impairment Problem Solving: Impaired Problem Solving Impairment: Verbal complex (Money: 1/3; time; 1/2) Executive Function: Sequencing;Organizing Sequencing: Impaired Sequencing Impairment: Verbal complex (clock: 2/4) Organizing:  (backward digit span: 2/2)       Comprehension  Auditory Comprehension Overall Auditory Comprehension: Appears within functional limits for tasks assessed Yes/No Questions: Within Functional Limits Commands: Within Functional Limits Conversation: Complex    Expression Expression Primary Mode of Expression: Verbal Verbal Expression Overall Verbal Expression: Appears within functional limits for tasks assessed Initiation: No impairment Level of Generative/Spontaneous Verbalization: Conversation Repetition: No impairment Naming: No impairment   Oral / Motor  Oral Motor/Sensory Function Overall Oral Motor/Sensory Function: Within functional  limits Motor Speech Overall Motor Speech: Appears within functional limits for tasks assessed Respiration: Within functional limits Phonation: Normal Resonance: Within functional limits Articulation: Within functional limitis Intelligibility: Intelligible Motor Planning: Witnin functional limits Motor Speech Errors: Not applicable           Pepper Kerrick I. Hardin Negus, [place], [place] Office number [phone]  Horton Marshall 04/23/2022, 4:46 PM

## 2022-04-24 ENCOUNTER — Observation Stay (HOSPITAL_COMMUNITY): Payer: Medicare PPO

## 2022-04-24 DIAGNOSIS — R4182 Altered mental status, unspecified: Secondary | ICD-10-CM | POA: Diagnosis not present

## 2022-04-24 DIAGNOSIS — I63511 Cerebral infarction due to unspecified occlusion or stenosis of right middle cerebral artery: Secondary | ICD-10-CM | POA: Diagnosis not present

## 2022-04-24 LAB — BASIC METABOLIC PANEL
Anion gap: 10 (ref 5–15)
BUN: 14 mg/dL (ref 8–23)
CO2: 20 mmol/L — ABNORMAL LOW (ref 22–32)
Calcium: 8.3 mg/dL — ABNORMAL LOW (ref 8.9–10.3)
Chloride: 106 mmol/L (ref 98–111)
Creatinine, Ser: 0.87 mg/dL (ref 0.44–1.00)
GFR, Estimated: >60 mL/min (ref >60)
Glucose, Bld: 95 mg/dL (ref 70–99)
Potassium: 3.7 mmol/L (ref 3.5–5.1)
Sodium: 136 mmol/L (ref 135–145)

## 2022-04-24 LAB — LIPID PANEL
Cholesterol: 130 mg/dL (ref 0–200)
HDL: 38 mg/dL — ABNORMAL LOW (ref >40)
LDL Cholesterol: 70 mg/dL (ref 0–99)
Total CHOL/HDL Ratio: 3.4 RATIO
Triglycerides: 111 mg/dL (ref <150)
VLDL: 22 mg/dL (ref 0–40)

## 2022-04-24 LAB — MAGNESIUM: Magnesium: 1.9 mg/dL (ref 1.7–2.4)

## 2022-04-24 MED ORDER — ASPIRIN 81 MG PO TBEC: 81.0000 mg | DELAYED_RELEASE_TABLET | Freq: Every day | ORAL | 0 refills | Status: DC

## 2022-04-24 MED ORDER — POTASSIUM CHLORIDE CRYS ER 20 MEQ PO TBCR
20.0000 meq | EXTENDED_RELEASE_TABLET | Freq: Once | ORAL | Status: AC
  Administered 2022-04-24: 20 meq via ORAL
  Filled 2022-04-24: qty 1

## 2022-04-24 MED ORDER — TORSEMIDE 20 MG PO TABS
10.0000 mg | ORAL_TABLET | Freq: Every day | ORAL | Status: DC
  Administered 2022-04-24: 10 mg via ORAL
  Filled 2022-04-24: qty 1

## 2022-04-24 MED ORDER — LOSARTAN POTASSIUM 25 MG PO TABS: 25.0000 mg | ORAL_TABLET | Freq: Every day | ORAL | Status: AC

## 2022-04-24 MED ORDER — CLOPIDOGREL BISULFATE 75 MG PO TABS: 75.0000 mg | ORAL_TABLET | Freq: Every day | ORAL | 2 refills | Status: DC

## 2022-04-24 NOTE — Discharge Instructions (Signed)
Follow with Primary MD Midge Minium, MD in 7 days   Get CBC, CMP, 2 view Chest X ray -  checked next visit with your primary MD    Activity: As tolerated with Full fall precautions use walker/cane & assistance as needed  Disposition Home    Diet: Heart Healthy   Special Instructions: If you have smoked or chewed Tobacco  in the last 2 yrs please stop smoking, stop any regular Alcohol  and or any Recreational drug use.  On your next visit with your primary care physician please Get Medicines reviewed and adjusted.  Please request your Prim.MD to go over all Hospital Tests and Procedure/Radiological results at the follow up, please get all Hospital records sent to your Prim MD by signing hospital release before you go home.  If you experience worsening of your admission symptoms, develop shortness of breath, life threatening emergency, suicidal or homicidal thoughts you must seek medical attention immediately by calling 911 or calling your MD immediately  if symptoms less severe.  You Must read complete instructions/literature along with all the possible adverse reactions/side effects for all the Medicines you take and that have been prescribed to you. Take any new Medicines after you have completely understood and accpet all the possible adverse reactions/side effects.

## 2022-04-24 NOTE — Progress Notes (Signed)
PT Cancellation Note  Patient Details Name: Angel Waller MRN: [id number] DOB: 12-25-1945   Cancelled Treatment:    Reason Eval/Treat Not Completed: PT screened, no needs identified, will sign off. Per conversation with OT and observation, pt ambulating and negotiating stairs independently with no concerns with mobility upon discharge. Pt with no current PT needs in the acute setting or upon discharge. PT signing off.    Luvenia Heller 04/24/2022, 12:25 PM

## 2022-04-24 NOTE — Plan of Care (Signed)
TTE from [place] ---------------------------------------------------------------    NORMAL LEFT VENTRICULAR SYSTOLIC FUNCTION WITH MILD LVH    NORMAL RIGHT VENTRICULAR SYSTOLIC FUNCTION    VALVULAR REGURGITATION: MILD AR, MILD MR, TRIVIAL PR, MILD TR    PROSTHETIC VALVE(S): BIOPROSTHETIC AoV    MILD MR: ERO 11 MM^2 , RVOL 20 ML    3D acquisition and reconstructions were performed as part of this    examination to more accurately quantify the effects of heart failure    regardless of ejection fraction.     Compared with prior Echo study on 09/04/2020: NO SIGNIFICANT CHANGE.    (Report version 3.0)                    Interpreted and Electronically signed   Perform. by: Marcy Panning, ACS,RDCS,RV             by: Gwynneth Albright, M   Resp.Person: Briant Sites             On: 04/09/2022 14:23:51

## 2022-04-24 NOTE — Evaluation (Addendum)
Occupational Therapy Evaluation/Discharge Patient Details Name: Angel Waller MRN: [id number] DOB: 02-22-1946 Today's Date: 04/24/2022   History of Present Illness Pt is a 77 y/o female presenting 1/25 with AMS and facial droop that began on 1/24.Imaging shows subacute R MCA infarct. PMH: HLD, AVR, PPM, migraines   Clinical Impression   PTA, pt lives with spouse, typically Independent with all daily tasks and active at baseline. Pt reports feeling cognition improving greatly since initial presentation and able to recall details of SLP evaluation yesterday. Cognition appropriate during OT session; will defer higher level cognitive assessments to SLP. Pt has been managing ADLs Independently in room this morning. Pt also able to mobilize in hallway and manage a flight of stairs without issues. Encouraged continued independent mobility while admitted. No further skilled OT services needed at this time. Pt functionally appropriate for DC home once medically cleared.       Recommendations for follow up therapy are one component of a multi-disciplinary discharge planning process, led by the attending physician.  Recommendations may be updated based on patient status, additional functional criteria and insurance authorization.   Follow Up Recommendations  No OT follow up     Assistance Recommended at Discharge None  Patient can return home with the following      Functional Status Assessment  Patient has not had a recent decline in their functional status  Equipment Recommendations  None recommended by OT    Recommendations for Other Services       Precautions / Restrictions Precautions Precautions: None Restrictions Weight Bearing Restrictions: No      Mobility Bed Mobility Overal bed mobility: Independent                  Transfers Overall transfer level: Independent Equipment used: None                      Balance Overall balance assessment: Independent                                          ADL either performed or assessed with clinical judgement   ADL Overall ADL's : Independent                                       General ADL Comments: reports having gone to bathroom independently prior to OT entry (rang call bell though no assist so unhooked from monitor and went). Pt able to demo excellent UB/LB strength, mobilize in room, hallway and manage flight of stairs without LOB or safety concerns     Vision Baseline Vision/History: 1 Wears glasses Ability to See in Adequate Light: 0 Adequate Patient Visual Report: No change from baseline Vision Assessment?: No apparent visual deficits     Perception     Praxis      Pertinent Vitals/Pain Pain Assessment Pain Assessment: No/denies pain     Hand Dominance Right   Extremity/Trunk Assessment Upper Extremity Assessment Upper Extremity Assessment: Overall WFL for tasks assessed   Lower Extremity Assessment Lower Extremity Assessment: Overall WFL for tasks assessed   Cervical / Trunk Assessment Cervical / Trunk Assessment: Normal   Communication Communication Communication: No difficulties   Cognition Arousal/Alertness: Awake/alert Behavior During Therapy: WFL for tasks assessed/performed Overall Cognitive Status: Within Functional Limits for tasks assessed  General Comments: WFL, not formally assessed. Able to recall SLP eval yesterday, self correction on how she drew her clock. able to give info in great detail, recall events and overall WFL for all tasks     General Comments       Exercises     Shoulder Instructions      Home Living Family/patient expects to be discharged to:: Private residence Living Arrangements: Spouse/significant other Available Help at Discharge: Family;Available 24 hours/day Type of Home: House Home Access: Stairs to enter [employer] of Steps:  5 Entrance Stairs-Rails: Left Home Layout: Two level;Bed/bath upstairs     Bathroom Shower/Tub: Occupational psychologist: Handicapped height     Home Equipment: Shower seat - built in;Grab bars - tub/shower          Prior Functioning/Environment Prior Level of Function : Independent/Modified Independent;Driving             Mobility Comments: no AD ADLs Comments: Indep with ADLs/IADLs, active and exercises often at baseline        OT Problem List:        OT Treatment/Interventions:      OT Goals(Current goals can be found in the care plan section) Acute Rehab OT Goals Patient Stated Goal: go home today OT Goal Formulation: All assessment and education complete, DC therapy  OT Frequency:      Co-evaluation              AM-PAC OT "6 Clicks" Daily Activity     Outcome Measure Help from another person eating meals?: None Help from another person taking care of personal grooming?: None Help from another person toileting, which includes using toliet, bedpan, or urinal?: None Help from another person bathing (including washing, rinsing, drying)?: None Help from another person to put on and taking off regular upper body clothing?: None Help from another person to put on and taking off regular lower body clothing?: None 6 Click Score: 24   End of Session Nurse Communication: Mobility status  Activity Tolerance: Patient tolerated treatment well Patient left: in bed;with call bell/phone within reach  OT Visit Diagnosis: Muscle weakness (generalized) (M62.81)                Time: 6389-3734 OT Time Calculation (min): 15 min Charges:  OT General Charges $OT Visit: 1 Visit OT Evaluation $OT Eval Low Complexity: 1 Low  Malachy Chamber, OTR/L Acute Rehab Services Office: [phone]   Layla Maw 04/24/2022, 10:02 AM

## 2022-04-24 NOTE — Progress Notes (Signed)
Question addressed by stroke team. Pt ready for discharge. Pt has all belongings and taken down to front entrance for discharge. Pt's husband to transport home via private vehicle.

## 2022-04-24 NOTE — Discharge Summary (Signed)
TOLUWANI RUDER MOQ:[id number] DOB: July 03, 1945 DOA: 04/22/2022  PCP: Midge Minium, MD  Admit date: 04/22/2022  Discharge date: 04/24/2022  Admitted From: Home   Disposition:  Home   Recommendations for Outpatient Follow-up:   Follow up with PCP in 1-2 weeks  PCP Please obtain BMP/CBC, 2 view CXR in 1week,  (see Discharge instructions)   PCP Please follow up on the following pending results: May consider stopping Estrogen replacement    Home Health: None   Equipment/Devices: None  Consultations: Neuro Discharge Condition: Stable    CODE STATUS: Full    Diet Recommendation: Heart Healthy     Chief Complaint  Patient presents with   Facial Droop     Brief history of present illness from the day of admission and additional interim summary    77 y.o. female with medical history significant of s/p AVR; HLD; and pacemaker placement presenting with facial droop. Her husband provided the majority of the history, as the patient did not think there was anything wrong with her during the episode.  On Wednesday (1/24), they were going to dinner with friends.  They do this regularly but this time the patient wasn't ready at the appointed time and when she showed up late her hair wasn't combed (very out of character).  They went to dinner and she had difficulty getting into the truck - the husband reports that she was uncoordinated, although the patient reports that her shoe bottoms were slick from rain.  Their friends noticed that she was acting oddly compared to her usual.  They went home to bed and the next morning she seemed to be ok.  Her husband was scheduled to go on a trip and she told him to go so he drove to [facility].  However, shortly thereafter her daughter came over and was concerned about this story and so  convinced the patient to come to the [place]   CVA -  R MCA territory, symptoms almost all resolved, cleared by CVA team for DC, DAPT x 3 weeks then Plavix alone, home Lipid Rx, TTE recently done stable, almost no deficits now, wants to go home ASAP, DC with outpt Neuro and PCP follow up.    HTN -Allow permissive HTN for 2 more days, then Home BP Med    HLD -stable LDL at 70 -Continue Lipitor 40 mg daily & Lovaza     Chronic diastolic CHF -Appears euvolemic at this time -Home diuretic   Discharge diagnosis     Principal Problem:   Acute ischemic right MCA stroke [facility]) Active Problems:   Hyperlipidemia   Pacemaker-[employer]tr[employer]   Chronic  diastolic (congestive) heart failure (HCC)   History of aortic valve replacement with bioprosthetic valve   Essential hypertension    Discharge instructions    Discharge Instructions     Diet - low sodium heart healthy   Complete by: As directed    Discharge instructions   Complete by: As directed    Follow with Primary MD Midge Minium, MD in 7 days   Get CBC, CMP, 2 view Chest X ray -  checked next visit with your primary MD    Activity: As tolerated with Full fall precautions use walker/cane & assistance as needed  Disposition Home    Diet: Heart Healthy   Special Instructions: If you have smoked or chewed Tobacco  in the last 2 yrs please stop smoking, stop any regular Alcohol  and or any Recreational drug use.  On your next visit with your primary care physician please Get Medicines reviewed and adjusted.  Please request your Prim.MD to go over all Hospital Tests and Procedure/Radiological results at the follow up, please get all Hospital records sent to your Prim MD by signing hospital release before you go home.  If you experience worsening of your admission symptoms, develop shortness of breath, life threatening emergency, suicidal or  homicidal thoughts you must seek medical attention immediately by calling 911 or calling your MD immediately  if symptoms less severe.  You Must read complete instructions/literature along with all the possible adverse reactions/side effects for all the Medicines you take and that have been prescribed to you. Take any new Medicines after you have completely understood and accpet all the possible adverse reactions/side effects.   Increase activity slowly   Complete by: As directed        Discharge Medications   Allergies as of 04/24/2022       Reactions   Amoxicillin Swelling        Medication List     TAKE these medications    acetaminophen 500 MG tablet Commonly known as: TYLENOL Take 1,000 mg by mouth as needed for moderate pain.   aspirin EC 81 MG tablet Take 1 tablet (81 mg total) by mouth daily.   atorvastatin 40 MG tablet Commonly known as: LIPITOR TAKE 1 TABLET BY MOUTH DAILY   Cholecalciferol 25 MCG (1000 UT) capsule Take 1,000 Units by mouth daily.   clindamycin 300 MG capsule Commonly known as: CLEOCIN For dental appt. Only used for dental appointments   clopidogrel 75 MG tablet Commonly known as: PLAVIX Take 1 tablet (75 mg total) by mouth daily. Start taking on: April 25, 2022   estradiol 2 MG tablet Commonly known as: ESTRACE Take 2 mg by mouth daily.   losartan 25 MG tablet Commonly known as: COZAAR Take 1 tablet (25 mg total) by mouth daily. Start taking on: April 26, 2022 What changed: These instructions start on April 26, 2022. If you are unsure what to do until then, ask your doctor or other care provider.   omega-3 acid ethyl esters 1 g capsule Commonly known as: LOVAZA Take 1 capsule by mouth 2 (two) times daily.   PRESERVISION AREDS PO Take 1 tablet by mouth in the morning and at bedtime.   torsemide 10 MG tablet Commonly known as: DEMADEX Take 10 mg by mouth daily.   tretinoin microspheres 0.1 % gel Commonly known as:  RETIN-A MICRO Apply 1 Application topically at bedtime.         Follow-up Information     Connect with your  PCP/Specialist as discussed. Schedule an appointment as soon as possible for a visit .   Contact information: [email or web address] Call our physician referral line at [phone].        Midge Minium, MD. Schedule an appointment as soon as possible for a visit in 1 week(s).   Specialty: Family Medicine Contact information: [phone] [place] [address] [phone]         [facility]. Schedule an appointment as soon as possible for a visit in 1 month(s).   Contact information: [address]     [place] [address] [phone]                Major procedures and Radiology Reports - PLEASE review detailed and final reports thoroughly  -     TTE from [place] ---------------------------------------------------------------    NORMAL LEFT VENTRICULAR SYSTOLIC FUNCTION WITH MILD LVH   NORMAL RIGHT VENTRICULAR SYSTOLIC FUNCTION   VALVULAR REGURGITATION: MILD AR, MILD MR, TRIVIAL PR, MILD TR   PROSTHETIC VALVE(S): BIOPROSTHETIC AoV   MILD MR: ERO 11 MM^2 , RVOL 20 ML    3D acquisition and reconstructions were performed as part of this examination to more accurately quantify the effects of heart failure regardless of ejection fraction.     Compared with prior Echo study on 09/04/2020: NO SIGNIFICANT CHANGE.    (Report version 3.0)                    Interpreted and Electronically signed   Perform. by: Marcy Panning, ACS,RDCS,RV             by: Gwynneth Albright, M   Resp.Person: Briant Sites             On: 04/09/2022 14:23:51      CT HEAD WO CONTRAST (5MM)  Result Date: 04/24/2022 CLINICAL DATA:  77 year old female with altered mental status, right MCA/insula infarct on initial head CT. CTA head and neck positive for distal right M1  thrombus. EXAM: CT HEAD WITHOUT CONTRAST TECHNIQUE: Contiguous axial images were obtained from the base of the skull through the vertex without intravenous contrast. RADIATION DOSE REDUCTION: This exam was performed according to the departmental dose-optimization program which includes automated exposure control, adjustment of the mA and/or kV according to patient size and/or use of iterative reconstruction technique. COMPARISON:  CTA head and neck, head CT 04/22/2022. FINDINGS: Brain: Confluent cytotoxic edema tracking from the right insula cephalad toward the frontal operculum. This has not significantly changed in size or configuration since presentation., but additional new roughly 2.5 cm area of posterior temporal cytotoxic edema nearby on series 4, image 14. And increased conspicuity of a small area of cytotoxic edema at the lateral right occipital lobe on series 4, image 18. No hemorrhagic transformation or significant mass effect. Stable gray-white differentiation elsewhere. No ventriculomegaly. Normal basilar cisterns. Small chronic right cerebellar infarct. Vascular: Calcified atherosclerosis at the skull base. Right MCA hyperdensity adjacent to cytotoxic edema on sagittal image 16. Skull: No acute osseous abnormality identified. Sinuses/Orbits: Visualized paranasal sinuses and mastoids are stable and well aerated. Other: No acute orbit or scalp soft tissue finding. Posttraumatic and/or postoperative appearing dystrophic calcification in the visible face. IMPRESSION: 1. Since 04/22/2022 mild extension of Right MCA infarcts into the posterior MCA territory. No hemorrhagic transformation or significant mass effect. 2. Small chronic right cerebellar infarct. Electronically Signed   By: Genevie Ann M.D.   On: 04/24/2022 12:46  CT ANGIO HEAD NECK W WO CM  Result Date: 04/22/2022 CLINICAL DATA:  Stroke, follow up EXAM: CT ANGIOGRAPHY HEAD AND NECK TECHNIQUE: Multidetector CT imaging of the head and neck was  performed using the standard protocol during bolus administration of intravenous contrast. Multiplanar CT image reconstructions and MIPs were obtained to evaluate the vascular anatomy. Carotid stenosis measurements (when applicable) are obtained utilizing NASCET criteria, using the distal internal carotid diameter as the denominator. RADIATION DOSE REDUCTION: This exam was performed according to the departmental dose-optimization program which includes automated exposure control, adjustment of the mA and/or kV according to patient size and/or use of iterative reconstruction technique. CONTRAST:  74m OMNIPAQUE IOHEXOL 350 MG/ML SOLN COMPARISON:  Same day CT head. FINDINGS: CTA NECK FINDINGS Aortic arch: Great vessel origins are patent. Calcific atherosclerosis of the aorta and great vessels. Right carotid system: Atherosclerosis at the carotid bifurcation without greater than 50% stenosis. Left carotid system: Atherosclerosis at the carotid bifurcation without greater than 50% stenosis Vertebral arteries: Left dominant. Both vertebral arteries are patent without significant (greater than 50%) stenosis. No acute findings. Skeleton: No acute findings. Other neck: No acute findings. Upper chest: Emphysema. No consolidation the visualized lung apices. Review of the MIP images confirms the above findings CTA HEAD FINDINGS Anterior circulation: Bilateral intracranial ICAs are patent without significant stenosis. Filling defect within the distal right M1 MCA that extends into proximal M2 MCA branches, compatible with thrombus. Resulting severe stenosis, nearly occlusive. Left MCA and bilateral ACAs are patent without proximal high-grade stenosis. Posterior circulation: Bilateral intradural vertebral arteries, basilar artery and bilateral posterior cerebral arteries are patent without proximal high-grade stenosis. Venous sinuses: As permitted by contrast timing, patent. Review of the MIP images confirms the above findings  IMPRESSION: Filling defect within the distal right M1 MCA that extends into proximal M2 MCA branches, compatible with thrombus. Resulting severe stenosis, nearly occlusive. Findings discussed with Dr. COswald Hillockvia telephone at 8:53 PM. Electronically Signed   By: FMargaretha SheffieldM.D.   On: 04/22/2022 20:56   CT HEAD WO CONTRAST  Result Date: 04/22/2022 CLINICAL DATA:  Altered mental status. EXAM: CT HEAD WITHOUT CONTRAST TECHNIQUE: Contiguous axial images were obtained from the base of the skull through the vertex without intravenous contrast. RADIATION DOSE REDUCTION: This exam was performed according to the departmental dose-optimization program which includes automated exposure control, adjustment of the mA and/or kV according to patient size and/or use of iterative reconstruction technique. COMPARISON:  Head CT dated 02/29/2012. FINDINGS: Brain: Mild age-related atrophy and chronic microvascular ischemic changes. A hypoattenuating area in the right insula consistent with an age indeterminate right MCA territory infarct, possibly subacute or chronic. Acute infarct is less likely but not excluded. There is no acute intracranial hemorrhage. No mass effect or midline shift. No extra-axial fluid collection. Vascular: Question slight higher attenuation of M1 segment of the right MCA (coronal 41/4). Further evaluation with CT angiography is recommended. Skull: Normal. Negative for fracture or focal lesion. Sinuses/Orbits: No acute finding. Other: None IMPRESSION: 1. No acute intracranial hemorrhage. 2. Age indeterminate right MCA territory infarct, likely subacute or chronic. Correlation with clinical exam and further evaluation with MRI or CT angiography is recommended. 3. Question slight higher attenuation of the M1 segment of the right MCA. These results were called by telephone at the time of interpretation on 04/22/2022 at 7:36 pm to [facility], who verbally acknowledged these results.  Electronically Signed   By: AAnner CreteM.D.   On: 04/22/2022 19:43  Micro Results     No results found for this or any previous visit (from the past 240 hour(s)).  Today   Subjective    Mirha Brucato today has no headache,no chest abdominal pain,no new weakness tingling or numbness, feels much better wants to go home today.     Objective   Blood pressure (!) 121/57, pulse 79, temperature 98.2 F (36.8 C), temperature source Oral, resp. rate 15, SpO2 97 %.   Intake/Output Summary (Last 24 hours) at 04/24/2022 1334 Last data filed at 04/23/2022 2300 Gross per 24 hour  Intake 419.01 ml  Output --  Net 419.01 ml    Exam  Awake Alert, No new F.N deficits,    [place]ict.AT,PERRAL Supple Neck,   Symmetrical Chest wall movement, Good air movement bilaterally, CTAB RRR,No Gallops,   +ve B.Sounds, Abd Soft, Non tender,  No Cyanosis, Clubbing or edema    Data Review   Recent Labs  Lab 04/22/22 1900  WBC 6.6  HGB 12.0  HCT 36.0  PLT 184  MCV 90.2  MCH 30.1  MCHC 33.3  RDW 13.0  LYMPHSABS 2.1  MONOABS 0.6  EOSABS 0.1  BASOSABS 0.0    Recent Labs  Lab 04/22/22 1900 04/23/22 1308 04/24/22 0304  NA 134*  --  136  K 3.4*  --  3.7  CL 102  --  106  CO2 23  --  20*  ANIONGAP 9  --  10  GLUCOSE 90  --  95  BUN 22  --  14  CREATININE 0.81  --  0.87  AST 24  --   --   ALT 13  --   --   ALKPHOS 54  --   --   BILITOT 0.7  --   --   ALBUMIN 3.2*  --   --   INR 1.0  --   --   HGBA1C  --  5.5  --   MG  --   --  1.9  CALCIUM 8.7*  --  8.3*     Total Time in preparing paper work, data evaluation and todays exam - 35 minutes  Signature  -    Lala Lund M.D on 04/24/2022 at 1:34 PM   -  To page go to [email or web address]

## 2022-04-24 NOTE — Progress Notes (Signed)
Discharge paperwork reviewed with pt and pt's daughter and husband. All verbalized understanding but still have many questions. I've reached out to the stroke team to see if someone could come up to address questions.

## 2022-04-24 NOTE — Care Management (Signed)
No TOC needs identified for DC.

## 2022-04-24 NOTE — Progress Notes (Addendum)
STROKE TEAM PROGRESS NOTE   INTERVAL HISTORY Her husband is at the bedside.   No acute events overnight.  Patient sitting at edge of bed eating lunch. Reviewed assessment and plan of care.  Repeat CT negative for hemorrhagic transformation, showed mild extension of right MCA infarcts.   OK to discharge from neurology standpoint.  Recommend continuing Aspirin/Plavix indefinitely, follow up with [facility] in 8 weeks and follow-up with vascular for severe MCA stenosis.  Vitals:   04/23/22 2241 04/23/22 2327 04/24/22 0400 04/24/22 0750  BP: 128/66 125/60 (!) 128/49 (!) 121/57  Pulse: 60 60 60 79  Resp: '16 15 17 15  '$ Temp: 98.3 F (36.8 C)   98.2 F (36.8 C)  TempSrc: Oral   Oral  SpO2: 94% 96% 95% 97%   CBC:  Recent Labs  Lab 04/22/22 1900  WBC 6.6  NEUTROABS 3.7  HGB 12.0  HCT 36.0  MCV 90.2  PLT 017   Basic Metabolic Panel:  Recent Labs  Lab 04/22/22 1900 04/24/22 0304  NA 134* 136  K 3.4* 3.7  CL 102 106  CO2 23 20*  GLUCOSE 90 95  BUN 22 14  CREATININE 0.81 0.87  CALCIUM 8.7* 8.3*  MG  --  1.9   Lipid Panel:  Recent Labs  Lab 04/24/22 0304  CHOL 130  TRIG 111  HDL 38*  CHOLHDL 3.4  VLDL 22  LDLCALC 70   HgbA1c:  Recent Labs  Lab 04/23/22 1308  HGBA1C 5.5   Urine Drug Screen: No results for input(s): "LABOPIA", "COCAINSCRNUR", "LABBENZ", "AMPHETMU", "THCU", "LABBARB" in the last 168 hours.  Alcohol Level  Recent Labs  Lab 04/22/22 1900  ETH <10    IMAGING past 24 hours CT HEAD WO CONTRAST (5MM)  Result Date: 04/24/2022 CLINICAL DATA:  77 year old female with altered mental status, right MCA/insula infarct on initial head CT. CTA head and neck positive for distal right M1 thrombus. EXAM: CT HEAD WITHOUT CONTRAST TECHNIQUE: Contiguous axial images were obtained from the base of the skull through the vertex without intravenous contrast. RADIATION DOSE REDUCTION: This exam was performed according to the departmental dose-optimization  program which includes automated exposure control, adjustment of the mA and/or kV according to patient size and/or use of iterative reconstruction technique. COMPARISON:  CTA head and neck, head CT 04/22/2022. FINDINGS: Brain: Confluent cytotoxic edema tracking from the right insula cephalad toward the frontal operculum. This has not significantly changed in size or configuration since presentation., but additional new roughly 2.5 cm area of posterior temporal cytotoxic edema nearby on series 4, image 14. And increased conspicuity of a small area of cytotoxic edema at the lateral right occipital lobe on series 4, image 18. No hemorrhagic transformation or significant mass effect. Stable gray-white differentiation elsewhere. No ventriculomegaly. Normal basilar cisterns. Small chronic right cerebellar infarct. Vascular: Calcified atherosclerosis at the skull base. Right MCA hyperdensity adjacent to cytotoxic edema on sagittal image 16. Skull: No acute osseous abnormality identified. Sinuses/Orbits: Visualized paranasal sinuses and mastoids are stable and well aerated. Other: No acute orbit or scalp soft tissue finding. Posttraumatic and/or postoperative appearing dystrophic calcification in the visible face. IMPRESSION: 1. Since 04/22/2022 mild extension of Right MCA infarcts into the posterior MCA territory. No hemorrhagic transformation or significant mass effect. 2. Small chronic right cerebellar infarct. Electronically Signed   By: Genevie Ann M.D.   On: 04/24/2022 12:46    PHYSICAL EXAM Physical Exam  HEENT-  [place]/AT   Lungs- Respirations unlabored Extremities- No edema  Neurological Examination Mental Status: Awake and alert. Oriented x 4. Fluent speech, intact naming/repetition/comprehension.  Cranial Nerves: II:  PERRL.  VF full.  III,IV, VI: No ptosis. EOMI. No nystagmus.  V: Sensation intact and symmetrical VII: mild  Left facial droop.  VIII: Hearing intact to voice IX,X: No hoarseness XI:  Symmetric XII: Tongue deviates slightly to the left when protruded Motor: 5/5 in all extremities with no pronator drift noted.  Sensory: light touch intact throughout, bilaterally.  Cerebellar: No ataxia with FNF bilaterally  Gait: Deferred  ASSESSMENT/PLAN Angel Waller is a 77 year old female with a history of HTN, aortic valve replacement, pacemaker and HLD who initially presented to the [facility] ED with acute onset of AMS. CT head revealed a subacute right MCA territory stroke.Transferred to [facility] for stroke work up.    Subacute Right MCA Ischemic Stroke Etiology:  Primarily atherothrombotic versus cardioembolic   CT head: No acute intracranial hemorrhage. Age indeterminate right MCA territory infarct, likely subacute or chronic.  Question slight higher attenuation of the M1 segment of the right MCA. CTA head & neck: Filling defect within the distal right M1 MCA that extends into proximal M2 MCA branches, compatible with thrombus Resulting severe stenosis, nearly occlusive. Repeat CT 1/27:  Since 04/22/2022 mild extension of Right MCA infarcts into the posterior MCA territory. No hemorrhagic transformation or significant mass effect. Small chronic right cerebellar infarct.  MRI: No MRI imaging d.t pacemaker incompatibility  2D Echo: Recent echo @ [facility] 04/09/2022 Normal LVEF with Mild LVH, Mild AR, Mild MR, Trivial PR, Mild  TR. Bioprosthetic AV.  LDL 70 HgbA1c 5.5 VTE prophylaxis - lovenox    Diet   Diet regular Room service appropriate? Yes; Fluid consistency: Thin   aspirin 81 mg daily prior to admission, now on aspirin 81 mg daily and clopidogrel 75 mg daily.  Therapy recommendations:  no follow-ups needed Disposition:  home  Hypertension Home meds:  Losartan BP management per standard protocol. Out of the permissive HTN time window.   Long-term BP goal normotensive  Hyperlipidemia Home meds:  Lipitor '40mg'$ , resumed in hospital LDL 70, goal < 70 Continue statin at  discharge   Other Stroke Risk Factors Advanced Age >/= 41  [facility] day # 0  OK to discharge from neurology standpoint.  Recommend continuing Aspirin/Plavix indefinitely, follow up with [facility] in 8 weeks and follow-up with vascular for severe MCA stenosis.   Pt seen by Neuro NP/APP and later by MD. Note/plan to be edited by MD as needed.    Angel Santee, DNP, AGACNP-BC [facility] Please use AMION for pager and EPIC for messaging  ATTENDING ATTESTATION:  77 year old with history of right MCA stroke unable to get MRI due to noncompatible pacemaker.  Repeat CT shows possible slight extension right MCA stroke.  MRI give [place] a better picture but again unable to get due to her pacemaker.  Nonetheless she does have M1 M2 stenosis on the right, recommend DAPT therapy now for 3 months and then Plavix after that.  She is scheduled to have dental procedure done in a week or 2 and I recommend that she hold off on this if possible.   Addendum: Later in the day patient's daughter arrived and I discussed the plan with her.  She feels like her mom is back to baseline suffer maybe a facial droop.  She understands the limitation of not being able to MRI.  She asked about activity and doing chair  exercises which I think is reasonable to resume.  Discussed avoiding hypotension and staying hydrated to avoid stroke symptoms recurrence.  She is okay with plan to discharge home today with follow-up in stroke clinic outpatient.  Dr. Reeves Forth evaluated pt independently, reviewed imaging, chart, labs. Discussed and formulated plan with the Resident/APP. Changes were made to the note where appropriate. Please see APP/resident note above for details.     MDM: Moderate. Pertinent labs, imaging results reviewed by me and considered in my decision making. Independently reviewed imaging. Medical records reviewed. Discussed the patient with another medical provider/personnel.  Obtained history from someone other than the patient.      Angel Silber,MD    To contact Stroke Continuity provider, please refer to [email or web address] After hours, contact General Neurology

## 2022-04-28 ENCOUNTER — Ambulatory Visit: Payer: Medicare PPO | Admitting: Family Medicine

## 2022-04-28 ENCOUNTER — Encounter: Payer: Self-pay | Admitting: Family Medicine

## 2022-04-28 VITALS — BP 118/76 | HR 88 | Temp 97.9°F | Resp 17 | Ht 64.0 in | Wt 137.0 lb

## 2022-04-28 DIAGNOSIS — Z8673 Personal history of transient ischemic attack (TIA), and cerebral infarction without residual deficits: Secondary | ICD-10-CM

## 2022-04-28 DIAGNOSIS — I63511 Cerebral infarction due to unspecified occlusion or stenosis of right middle cerebral artery: Secondary | ICD-10-CM

## 2022-04-28 LAB — BASIC METABOLIC PANEL
BUN: 26 mg/dL — ABNORMAL HIGH (ref 6–23)
CO2: 27 mEq/L (ref 19–32)
Calcium: 9.6 mg/dL (ref 8.4–10.5)
Chloride: 100 mEq/L (ref 96–112)
Creatinine, Ser: 1 mg/dL (ref 0.40–1.20)
GFR: 54.44 mL/min — ABNORMAL LOW (ref >60.00)
Glucose, Bld: 89 mg/dL (ref 70–99)
Potassium: 4.2 mEq/L (ref 3.5–5.1)
Sodium: 134 mEq/L — ABNORMAL LOW (ref 135–145)

## 2022-04-28 LAB — CBC WITH DIFFERENTIAL/PLATELET
Basophils Absolute: 0 10*3/uL (ref 0.0–0.1)
Basophils Relative: 0.7 % (ref 0.0–3.0)
Eosinophils Absolute: 0.2 10*3/uL (ref 0.0–0.7)
Eosinophils Relative: 3.4 % (ref 0.0–5.0)
HCT: 38 % (ref 36.0–46.0)
Hemoglobin: 13 g/dL (ref 12.0–15.0)
Lymphocytes Relative: 25.8 % (ref 12.0–46.0)
Lymphs Abs: 1.4 10*3/uL (ref 0.7–4.0)
MCHC: 34.4 g/dL (ref 30.0–36.0)
MCV: 91.2 fl (ref 78.0–100.0)
Monocytes Absolute: 0.5 10*3/uL (ref 0.1–1.0)
Monocytes Relative: 9.8 % (ref 3.0–12.0)
Neutro Abs: 3.3 10*3/uL (ref 1.4–7.7)
Neutrophils Relative %: 60.3 % (ref 43.0–77.0)
Platelets: 216 10*3/uL (ref 150.0–400.0)
RBC: 4.16 Mil/uL (ref 3.87–5.11)
RDW: 13.5 % (ref 11.5–15.5)
WBC: 5.4 10*3/uL (ref 4.0–10.5)

## 2022-04-28 NOTE — Assessment & Plan Note (Signed)
New.  Reviewed imaging, H&P, consult notes, labs, d/c summary.  Pt reports there was some disagreement in the hospital as to whether she truly had a stroke.  I told her that based on the imaging, it did appear to be a stroke.  Thankfully her facial droop and any physical sxs have resolved.  She is back to baseline and reports feeling well.  We reviewed her med list.  She will continue on ASA and Plavix x3 weeks and then Plavix only.  Has neuro f/u in March.  She was told that she did not require PT/OT/speech.  Her d/c summary indicated a chest xray is needed but I cannot determine why and she is not interested at this time.  Check CBC and BMP as recommended.  Will follow.

## 2022-04-28 NOTE — Progress Notes (Signed)
   Subjective:    Patient ID: Angel Waller, female    DOB: 1945-07-29, 77 y.o.   MRN: [id number]  [facility] f/u- pt present to ER on 1/25 with facial droop and coordination issues.  Imaging showed a R MCA stroke.  She was started on DAPT x3 weeks and then will continue Plavix alone.  She was to continue her home lipid medications- Lipitor '40mg'$  daily and Lovaza- as well as resume Losartan '25mg'$  daily.  She was d/c'd home on 1/27 w/ HH PT and neuro f/u (3/5)  D/c summary indicated they wanted BMP, CBC, and repeat CXR in 1 week.  Pt has no idea why she would need a CXR as she did not have one done while hospitalized.  Pt reports she did not need speech or OT after her initial evaluation.    Reviewed H&P, D/C, consult note, labs, imaging.  Meds reconciled.  'i feel fine'.  No trouble w/ speech, no difficulty swallowing.  BP looks great.     Review of Systems For ROS see HPI     Objective:   Physical Exam Vitals reviewed.  Constitutional:      General: She is not in acute distress.    Appearance: Normal appearance. She is well-developed. She is not ill-appearing.  HENT:     Head: Normocephalic and atraumatic.  Eyes:     Conjunctiva/sclera: Conjunctivae normal.     Pupils: Pupils are equal, round, and reactive to light.  Neck:     Thyroid: No thyromegaly.  Cardiovascular:     Rate and Rhythm: Normal rate and regular rhythm.     Pulses: Normal pulses.     Heart sounds: Normal heart sounds. No murmur heard. Pulmonary:     Effort: Pulmonary effort is normal. No respiratory distress.     Breath sounds: Normal breath sounds.  Abdominal:     General: There is no distension.     Palpations: Abdomen is soft.     Tenderness: There is no abdominal tenderness.  Musculoskeletal:     Cervical back: Normal range of motion and neck supple.     Right lower leg: No edema.     Left lower leg: No edema.  Lymphadenopathy:     Cervical: No cervical adenopathy.  Skin:    General: Skin is warm  and dry.  Neurological:     General: No focal deficit present.     Mental Status: She is alert and oriented to person, place, and time.     Cranial Nerves: No cranial nerve deficit.     Coordination: Coordination normal.     Gait: Gait normal.  Psychiatric:        Mood and Affect: Mood normal.        Behavior: Behavior normal.           Assessment & Plan:

## 2022-04-28 NOTE — Patient Instructions (Signed)
Follow up as needed or as scheduled We'll notify you of your lab results and make any changes if needed CONTINUE the Aspirin and Plavix as directed Call with any questions or concerns Stay Safe!  Stay Healthy!

## 2022-04-29 ENCOUNTER — Encounter: Payer: Self-pay | Admitting: Family Medicine

## 2022-04-29 ENCOUNTER — Telehealth: Payer: Self-pay

## 2022-04-29 NOTE — Telephone Encounter (Signed)
-----  Message from Midge Minium, MD sent at 04/29/2022  7:27 AM EST ----- Labs look great!  No changes at this time

## 2022-04-29 NOTE — Telephone Encounter (Signed)
Informed pt of lab results  

## 2022-05-05 ENCOUNTER — Encounter: Payer: Self-pay | Admitting: Family Medicine

## 2022-06-01 ENCOUNTER — Ambulatory Visit: Payer: Medicare PPO | Admitting: Neurology

## 2022-06-01 ENCOUNTER — Encounter: Payer: Self-pay | Admitting: Neurology

## 2022-06-01 VITALS — BP 128/77 | HR 68 | Ht 64.0 in | Wt 134.0 lb

## 2022-06-01 DIAGNOSIS — I639 Cerebral infarction, unspecified: Secondary | ICD-10-CM

## 2022-06-01 DIAGNOSIS — G4752 REM sleep behavior disorder: Secondary | ICD-10-CM | POA: Diagnosis not present

## 2022-06-01 NOTE — Progress Notes (Signed)
Chief Complaint  Patient presents with   Hospitalization Follow-up    Rm 13. Alone. Hosp f/u CVA - needs 1 month f/u.      ASSESSMENT AND PLAN  Angel Waller is a 77 y.o. female  Stroke, involving right MCA on April 21, 2022  CT angiogram of neck showed less than 50% stenosis of bilateral internal carotid artery  Sitting angiogram of the head showed filling defect within the right M1, extending to proximal M2 branch, compatible with thrombus, nearly totally motion,  Recent diagnosis of atrial fibrillation, on Eliquis now  Still has vascular risk factor of hypertension, hyperlipidemia, tissue aortic valve replacement, complete heart block, status post pacemaker  She was on estradiol 2 mg daily for many years, looked up the literature, which does increase the risk of stroke in elderly, decided to taper it off over 1 month   Continue follow-up with primary care and cardiologist only return to clinic for new issues   DIAGNOSTIC DATA (LABS, IMAGING, TESTING) - I reviewed patient records, labs, notes, testing and imaging myself where available.   MEDICAL HISTORY:  Angel Waller, is a 77 year old left-handed female, seen in request by her primary care physician Dr. Annye Asa for evaluation of stroke, initial evaluation was on May 31, 2021 for   I reviewed and summarized the referring note.  Past medical history Hypertension Status post pacemaker, complete heart block Tissue aortic valve replacement in July 2010, was on ASA '81mg'$  only Hyperlipidemia New diagnosis of A fib. On Eliquis '5mg'$  bid  She lives with her husband and independent, was taking aspirin 81 mg daily prior to her stroke  On April 22, 2023, about 5:30 PM, while she was out having dinner with her friend, was noted to have mild slurred speech, left facial droop, later husband also noted that she dropped water on the floor without taking care of it, which is all of her character  She was urged by her  daughter to go to emergency room the next day on April 22, 2022,  Personally reviewed CT head, right MCA stroke, involving right insular, repeat CT head on January 27 showed additional right posterior temporal cytogenic edema  CT angiogram of head and neck showed less than 50% stenosis of bilateral internal carotid artery, filling defect within the distal right M1 extending to proximal M2 branches, compatible with thrombosis with near total occlusion  She was discharged with aspirin 81 mg plus Plavix 75 mg  Shortly afterwards, she was found to have atrial fibrillation by cardiologist, pacemaker monitoring, now switched to Eliquis 5 mg twice a day, antiplatelet agent therapy has stopped.  Laboratory evaluation A1c 5.5, lipid panel LDL 70, normal BMP creatinine 0.87, calcium was mildly low at 8.3, normal CBC hemoglobin of 13  She has been on estradiol supplement 2 mg daily for many years, questioning whether she should continue on that  She is going to have cataract surgery, left lower molar tooth extraction soon  PHYSICAL EXAM:   Vitals:   06/01/22 1043  BP: 128/77  Pulse: 68  Weight: 134 lb (60.8 kg)  Height: '5\' 4"'$  (1.626 m)   Not recorded     Body mass index is 23 kg/m.  PHYSICAL EXAMNIATION:  Gen: NAD, conversant, well nourised, well groomed                     Cardiovascular: Regular rate rhythm, no peripheral edema, warm, nontender. Eyes: Conjunctivae clear without exudates or hemorrhage Neck: Supple,  no carotid bruits. Pulmonary: Clear to auscultation bilaterally   NEUROLOGICAL EXAM:  MENTAL STATUS: Speech/cognition: Awake, alert, oriented to history taking and casual conversation CRANIAL NERVES: CN II: Visual fields are full to confrontation. Pupils are round equal and briskly reactive to light. CN III, IV, VI: extraocular movement are normal. No ptosis. CN V: Facial sensation is intact to light touch CN VII: Face is symmetric with normal eye closure  CN  VIII: Hearing is normal to causal conversation. CN IX, X: Phonation is normal. CN XI: Head turning and shoulder shrug are intact  MOTOR: There is no pronator drift of out-stretched arms. Muscle bulk and tone are normal. Muscle strength is normal.  REFLEXES: Reflexes are 2+ and symmetric at the biceps, triceps, knees, and ankles. Plantar responses are flexor.  SENSORY: Intact to light touch, pinprick and vibratory sensation are intact in fingers and toes.  COORDINATION: There is no trunk or limb dysmetria noted.  GAIT/STANCE: Posture is normal. Gait is steady with normal steps, base, arm swing, and turning. Heel and toe walking are normal. Tandem gait is normal.  Romberg is absent.  REVIEW OF SYSTEMS:  Full 14 system review of systems performed and notable only for as above All other review of systems were negative.   ALLERGIES: Allergies  Allergen Reactions   Amoxicillin Swelling    HOME MEDICATIONS: Current Outpatient Medications  Medication Sig Dispense Refill   acetaminophen (TYLENOL) 500 MG tablet Take 1,000 mg by mouth as needed for moderate pain.     apixaban (ELIQUIS) 5 MG TABS tablet Take 5 mg by mouth 2 (two) times daily.     atorvastatin (LIPITOR) 40 MG tablet TAKE 1 TABLET BY MOUTH DAILY (Patient taking differently: Take 40 mg by mouth daily.) 90 tablet 1   Cholecalciferol 25 MCG (1000 UT) capsule Take 1,000 Units by mouth daily.     clindamycin (CLEOCIN) 300 MG capsule For dental appt. Only used for dental appointments     estradiol (ESTRACE) 2 MG tablet Take 2 mg by mouth daily.      losartan (COZAAR) 25 MG tablet Take 1 tablet (25 mg total) by mouth daily.     omega-3 acid ethyl esters (LOVAZA) 1 g capsule Take 1 capsule by mouth 2 (two) times daily.     torsemide (DEMADEX) 10 MG tablet Take 10 mg by mouth daily.      tretinoin microspheres (RETIN-A MICRO) 0.1 % gel Apply 1 Application topically at bedtime.     No current facility-administered medications  for this visit.    PAST MEDICAL HISTORY: Past Medical History:  Diagnosis Date   Allergy    Arthritis    "right knee" (02/29/2012)   Blood transfusion without reported diagnosis    Complete heart block ([place])    Heart murmur    Hyperlipemia    Migraines    "occasionally" (02/29/2012)   Osteopenia    Pacemaker    S/P AVR (aortic valve replacement)    July 2010   Seasonal allergies     PAST SURGICAL HISTORY: Past Surgical History:  Procedure Laterality Date   ABDOMINAL HYSTERECTOMY     CARDIAC VALVE REPLACEMENT  2010   tissue aortic valve; [place]   COLONOSCOPY  2005   negative; [place], [place]   INSERT / REPLACE / REMOVE PACEMAKER     KNEE ARTHROSCOPY Right    PERMANENT PACEMAKER INSERTION  03/02/2012   Medtronic Adapta L implanted by Dr Rayann Heman   PERMANENT PACEMAKER INSERTION N/A 03/02/2012  Procedure: PERMANENT PACEMAKER INSERTION;  Surgeon: Thompson Grayer, MD;  Location: [facility] C[facility] LAB;  Service: Cardiovascular;  Laterality: N/A;   POLYPECTOMY     TEMPORARY PACEMAKER INSERTION N/A 03/01/2012   Procedure: TEMPORARY PACEMAKER INSERTION;  Surgeon: Burnell Blanks, MD;  Location: [facility] CATH LAB;  Service: Cardiovascular;  Laterality: N/A;   TONSILLECTOMY AND ADENOIDECTOMY  1962   TOTAL KNEE ARTHROPLASTY Right 01/07/2014   Procedure: RIGHT TOTAL KNEE ARTHROPLASTY;  Surgeon: Mauri Pole, MD;  Location: [facility] ORS;  Service: Orthopedics;  Laterality: Right;    FAMILY HISTORY: Family History  Problem Relation Age of Onset   Heart attack Mother 21       CABG   Heart attack Father 62       aneurysm post MI   Colon polyps Father    Heart disease Other        in both M & P uncles & aunts; no premature MI   Diabetes Neg Hx    Stroke Neg Hx    Colon cancer Neg Hx    Pancreatic cancer Neg Hx    Rectal cancer Neg Hx    Stomach cancer Neg Hx     SOCIAL HISTORY: Social History   Socioeconomic History   Marital status: Married    Spouse name: Not on file   Number of children:  Not on file   Years of education: Not on file   Highest education level: Not on file  Occupational History   Occupation: retired  Tobacco Use   Smoking status: Former    Packs/day: 0.50    Years: 42.00    Total pack years: 21.00    Types: Cigarettes    Quit date: 03/30/2007    Years since quitting: 15.1   Smokeless tobacco: Never   Tobacco comments:    smoked ages [age], up to 1 pp WEEK  Vaping Use   Vaping Use: Never used  Substance and Sexual Activity   Alcohol use: No   Drug use: No   Sexual activity: Yes  Other Topics Concern   Not on file  Social History Narrative   Not on file   Social Determinants of Health   Financial Resource Strain: Low Risk  (12/30/2021)   Overall Financial Resource Strain (CARDIA)    Difficulty of Paying Living Expenses: Not hard at all  Food Insecurity: No Food Insecurity (04/23/2022)   Hunger Vital Sign    Worried About Running Out of Food in the Last Year: Never true    [place] in the Last Year: Never true  Transportation Needs: No Transportation Needs (04/23/2022)   PRAPARE - Hydrologist (Medical): No    Lack of Transportation (Non-Medical): No  Physical Activity: Insufficiently Active (12/30/2021)   Exercise Vital Sign    Days of Exercise per Week: 6 days    Minutes of Exercise per Session: 10 min  Stress: No Stress Concern Present (12/30/2021)   [place]    Feeling of Stress : Not at all  Social Connections: [place] (12/30/2021)   Social Connection and Isolation Panel [NHANES]    Frequency of Communication with Friends and Family: More than three times a week    Frequency of Social Gatherings with Friends and Family: More than three times a week    Attends Religious Services: 1 to 4 times per year    Active Member of [employer] or Organizations: Yes  Attends Archivist Meetings: 1 to 4 times per year    Marital  Status: Married  Human resources officer Violence: Not At Risk (04/23/2022)   Humiliation, Afraid, Rape, and Kick questionnaire    Fear of Current or Ex-Partner: No    Emotionally Abused: No    Physically Abused: No    Sexually Abused: No      Marcial Pacas, M.D. Ph.D.  [facility] [address], [place], [place] [address] Ph: [phone] Fax: [phone]  CC:  Midge Minium, MD [address] [place],   [id number]  Midge Minium, MD

## 2022-06-03 ENCOUNTER — Telehealth: Payer: Self-pay

## 2022-06-03 NOTE — Telephone Encounter (Signed)
Form received for patient to have surgical extraction of one tooth. Form placed on MD desk for review and signature.

## 2022-06-08 NOTE — Telephone Encounter (Signed)
Presurgical clearance from dentist form completed

## 2022-06-09 DIAGNOSIS — Z1231 Encounter for screening mammogram for malignant neoplasm of breast: Secondary | ICD-10-CM | POA: Diagnosis not present

## 2022-06-09 DIAGNOSIS — R92333 Mammographic heterogeneous density, bilateral breasts: Secondary | ICD-10-CM | POA: Diagnosis not present

## 2022-06-09 DIAGNOSIS — Z01419 Encounter for gynecological examination (general) (routine) without abnormal findings: Secondary | ICD-10-CM | POA: Diagnosis not present

## 2022-06-09 LAB — HM MAMMOGRAPHY

## 2022-06-09 NOTE — Telephone Encounter (Addendum)
Form faxed and received

## 2022-07-28 DIAGNOSIS — Z8673 Personal history of transient ischemic attack (TIA), and cerebral infarction without residual deficits: Secondary | ICD-10-CM | POA: Diagnosis not present

## 2022-07-28 DIAGNOSIS — I63311 Cerebral infarction due to thrombosis of right middle cerebral artery: Secondary | ICD-10-CM | POA: Diagnosis not present

## 2022-07-30 ENCOUNTER — Encounter: Payer: Self-pay | Admitting: Family Medicine

## 2022-07-30 ENCOUNTER — Ambulatory Visit (INDEPENDENT_AMBULATORY_CARE_PROVIDER_SITE_OTHER): Payer: Medicare PPO | Admitting: Family Medicine

## 2022-07-30 VITALS — BP 108/62 | HR 59 | Temp 97.9°F | Resp 17 | Ht 64.0 in | Wt 136.1 lb

## 2022-07-30 DIAGNOSIS — E785 Hyperlipidemia, unspecified: Secondary | ICD-10-CM | POA: Diagnosis not present

## 2022-07-30 DIAGNOSIS — I10 Essential (primary) hypertension: Secondary | ICD-10-CM

## 2022-07-30 LAB — BASIC METABOLIC PANEL
BUN: 19 mg/dL (ref 6–23)
CO2: 28 mEq/L (ref 19–32)
Calcium: 9.5 mg/dL (ref 8.4–10.5)
Chloride: 103 mEq/L (ref 96–112)
Creatinine, Ser: 0.83 mg/dL (ref 0.40–1.20)
GFR: 67.96 mL/min (ref >60.00)
Glucose, Bld: 88 mg/dL (ref 70–99)
Potassium: 4.1 mEq/L (ref 3.5–5.1)
Sodium: 138 mEq/L (ref 135–145)

## 2022-07-30 LAB — CBC WITH DIFFERENTIAL/PLATELET
Basophils Absolute: 0 10*3/uL (ref 0.0–0.1)
Basophils Relative: 0.7 % (ref 0.0–3.0)
Eosinophils Absolute: 0.2 10*3/uL (ref 0.0–0.7)
Eosinophils Relative: 3 % (ref 0.0–5.0)
HCT: 35.4 % — ABNORMAL LOW (ref 36.0–46.0)
Hemoglobin: 11.7 g/dL — ABNORMAL LOW (ref 12.0–15.0)
Lymphocytes Relative: 24.1 % (ref 12.0–46.0)
Lymphs Abs: 1.4 10*3/uL (ref 0.7–4.0)
MCHC: 33.1 g/dL (ref 30.0–36.0)
MCV: 91.9 fl (ref 78.0–100.0)
Monocytes Absolute: 0.5 10*3/uL (ref 0.1–1.0)
Monocytes Relative: 8.4 % (ref 3.0–12.0)
Neutro Abs: 3.8 10*3/uL (ref 1.4–7.7)
Neutrophils Relative %: 63.8 % (ref 43.0–77.0)
Platelets: 191 10*3/uL (ref 150.0–400.0)
RBC: 3.85 Mil/uL — ABNORMAL LOW (ref 3.87–5.11)
RDW: 13.7 % (ref 11.5–15.5)
WBC: 6 10*3/uL (ref 4.0–10.5)

## 2022-07-30 LAB — HEPATIC FUNCTION PANEL
ALT: 9 U/L (ref 0–35)
AST: 16 U/L (ref 0–37)
Albumin: 3.5 g/dL (ref 3.5–5.2)
Alkaline Phosphatase: 59 U/L (ref 39–117)
Bilirubin, Direct: 0.1 mg/dL (ref 0.0–0.3)
Total Bilirubin: 0.4 mg/dL (ref 0.2–1.2)
Total Protein: 7.4 g/dL (ref 6.0–8.3)

## 2022-07-30 LAB — LIPID PANEL
Cholesterol: 135 mg/dL (ref 0–200)
HDL: 40.7 mg/dL (ref >39.00)
LDL Cholesterol: 61 mg/dL (ref 0–99)
NonHDL: 93.93
Total CHOL/HDL Ratio: 3
Triglycerides: 165 mg/dL — ABNORMAL HIGH (ref 0.0–149.0)
VLDL: 33 mg/dL (ref 0.0–40.0)

## 2022-07-30 LAB — TSH: TSH: 2.66 u[IU]/mL (ref 0.35–5.50)

## 2022-07-30 NOTE — Assessment & Plan Note (Signed)
Chronic problem.  Currently on Atorvastatin 40mg  daily and Lovaza w/o difficulty.  Check labs.  Adjust meds prn

## 2022-07-30 NOTE — Assessment & Plan Note (Signed)
Chronic problem, on Losartan 25mg  daily and Torsemide 10mg  daily w/o difficulty.  Currently asymptomatic.  Check labs due to ARB and diuretic use but no anticipated med changes.

## 2022-07-30 NOTE — Patient Instructions (Signed)
Schedule your complete physical in 6 months We'll notify you of your lab results and make any changes if needed Keep up the good work on healthy diet and regular exercise- you look great!! Call with any questions or concerns Stay Safe!  Stay Healthy! Have a great summer!!! 

## 2022-07-30 NOTE — Progress Notes (Signed)
   Subjective:    Patient ID: Angel Waller, female    DOB: 06-02-1945, 77 y.o.   MRN: [id number]  HPI HTN- chronic problem, on Losartan 25mg  daily and Torsemide 10mg  daily w/ good control.  Pt reports feeling good.  No CP, SOB, HA's, visual changes, edema  Hyperlipidemia- chronic problem, on Atorvastatin 40mg  daily and Lovaza.  No abd pain, N/V.   Review of Systems For ROS see HPI     Objective:   Physical Exam Vitals reviewed.  Constitutional:      General: She is not in acute distress.    Appearance: Normal appearance. She is well-developed. She is not ill-appearing.  HENT:     Head: Normocephalic and atraumatic.  Eyes:     Conjunctiva/sclera: Conjunctivae normal.     Pupils: Pupils are equal, round, and reactive to light.  Neck:     Thyroid: No thyromegaly.  Cardiovascular:     Rate and Rhythm: Normal rate and regular rhythm.     Pulses: Normal pulses.     Heart sounds: Normal heart sounds. No murmur heard. Pulmonary:     Effort: Pulmonary effort is normal. No respiratory distress.     Breath sounds: Normal breath sounds.  Abdominal:     General: There is no distension.     Palpations: Abdomen is soft.     Tenderness: There is no abdominal tenderness.  Musculoskeletal:     Cervical back: Normal range of motion and neck supple.  Lymphadenopathy:     Cervical: No cervical adenopathy.  Skin:    General: Skin is warm and dry.  Neurological:     General: No focal deficit present.     Mental Status: She is alert and oriented to person, place, and time.  Psychiatric:        Mood and Affect: Mood normal.        Behavior: Behavior normal.        Thought Content: Thought content normal.           Assessment & Plan:

## 2022-08-02 ENCOUNTER — Telehealth: Payer: Self-pay

## 2022-08-02 NOTE — Telephone Encounter (Signed)
-----   Message from Sheliah Hatch, MD sent at 08/02/2022  7:30 AM EDT ----- Labs look good!  No changes at this time

## 2022-08-02 NOTE — Telephone Encounter (Signed)
Left results on pt VM  

## 2022-08-04 DIAGNOSIS — Z950 Presence of cardiac pacemaker: Secondary | ICD-10-CM | POA: Diagnosis not present

## 2022-08-08 DIAGNOSIS — Z950 Presence of cardiac pacemaker: Secondary | ICD-10-CM | POA: Diagnosis not present

## 2022-08-30 ENCOUNTER — Telehealth: Payer: Self-pay | Admitting: Family Medicine

## 2022-08-30 NOTE — Telephone Encounter (Signed)
I will give to Dr Beverely Low but we can not review the disc

## 2022-08-30 NOTE — Telephone Encounter (Signed)
[facility] sent images on a disc.

## 2022-08-30 NOTE — Telephone Encounter (Signed)
I know I called and it's [facility] policy.

## 2022-09-06 ENCOUNTER — Telehealth: Payer: Self-pay

## 2022-09-06 NOTE — Telephone Encounter (Signed)
Couple of things. The original message was not routed to the clinical pool for others to see what to do with it in case she calls back.   Patient did call back and stated that she didn't know what the disc was, however, we need to do some more research through patient charts because under letters you can find that a release was sent for her mammogram which is what that CD was. Her mammo results are already abstracted.

## 2022-09-06 NOTE — Telephone Encounter (Signed)
I have left the pt two VM on each line asking her to return my call. We received a disk from [facility]  and no name as to what the disk was of. We are unable to download any disk . I called [facility] and I have to fax a release but first I need to know what the disk is of . When pt calls back we need to know what is on it so I can send a release . Meantime disk is in filing cabinet up front for pt to pickup .

## 2022-09-07 NOTE — Telephone Encounter (Signed)
The CD nor the letter from [facility] had any information attached stating what it was just the pt DOB and name and how to download the disc .

## 2022-09-09 DIAGNOSIS — H2513 Age-related nuclear cataract, bilateral: Secondary | ICD-10-CM | POA: Diagnosis not present

## 2022-09-09 DIAGNOSIS — H2511 Age-related nuclear cataract, right eye: Secondary | ICD-10-CM | POA: Diagnosis not present

## 2022-09-09 DIAGNOSIS — H40013 Open angle with borderline findings, low risk, bilateral: Secondary | ICD-10-CM | POA: Diagnosis not present

## 2022-09-09 DIAGNOSIS — H25043 Posterior subcapsular polar age-related cataract, bilateral: Secondary | ICD-10-CM | POA: Diagnosis not present

## 2022-09-09 DIAGNOSIS — H25013 Cortical age-related cataract, bilateral: Secondary | ICD-10-CM | POA: Diagnosis not present

## 2022-10-14 DIAGNOSIS — Z950 Presence of cardiac pacemaker: Secondary | ICD-10-CM | POA: Diagnosis not present

## 2022-10-14 DIAGNOSIS — I480 Paroxysmal atrial fibrillation: Secondary | ICD-10-CM | POA: Diagnosis not present

## 2022-10-14 DIAGNOSIS — Z953 Presence of xenogenic heart valve: Secondary | ICD-10-CM | POA: Diagnosis not present

## 2022-10-14 DIAGNOSIS — E782 Mixed hyperlipidemia: Secondary | ICD-10-CM | POA: Diagnosis not present

## 2022-10-14 DIAGNOSIS — I442 Atrioventricular block, complete: Secondary | ICD-10-CM | POA: Diagnosis not present

## 2022-10-14 DIAGNOSIS — I2510 Atherosclerotic heart disease of native coronary artery without angina pectoris: Secondary | ICD-10-CM | POA: Diagnosis not present

## 2022-10-27 ENCOUNTER — Ambulatory Visit: Payer: Medicare PPO | Admitting: *Deleted

## 2022-10-27 DIAGNOSIS — Z0000 Encounter for general adult medical examination without abnormal findings: Secondary | ICD-10-CM | POA: Diagnosis not present

## 2022-10-27 NOTE — Patient Instructions (Signed)
Angel Waller , Thank you for taking time to come for your Medicare Wellness Visit. I appreciate your ongoing commitment to your health goals. Please review the following plan we discussed and let me know if I can assist you in the future.   Screening recommendations/referrals: Colonoscopy: no longer required Mammogram: up to date Bone Density: - Recommended yearly ophthalmology/optometry visit for glaucoma screening and checkup Recommended yearly dental visit for hygiene and checkup  Vaccinations: Influenza vaccine: up to date Pneumococcal vaccine: up to date Tdap vaccine: Education provided Shingles vaccine: up to date    Advanced directives: yes     Preventive Care 65 Years and Older, Female Preventive care refers to lifestyle choices and visits with your health care provider that can promote health and wellness. What does preventive care include? A yearly physical exam. This is also called an annual well check. Dental exams once or twice a year. Routine eye exams. Ask your health care provider how often you should have your eyes checked. Personal lifestyle choices, including: Daily care of your teeth and gums. Regular physical activity. Eating a healthy diet. Avoiding tobacco and drug use. Limiting alcohol use. Practicing safe sex. Taking low-dose aspirin every day. Taking vitamin and mineral supplements as recommended by your health care provider. What happens during an annual well check? The services and screenings done by your health care provider during your annual well check will depend on your age, overall health, lifestyle risk factors, and family history of disease. Counseling  Your health care provider may ask you questions about your: Alcohol use. Tobacco use. Drug use. Emotional well-being. Home and relationship well-being. Sexual activity. Eating habits. History of falls. Memory and ability to understand (cognition). Work and work  Astronomer. Reproductive health. Screening  You may have the following tests or measurements: Height, weight, and BMI. Blood pressure. Lipid and cholesterol levels. These may be checked every 5 years, or more frequently if you are over 105 years old. Skin check. Lung cancer screening. You may have this screening every year starting at age 50 if you have a 30-pack-year history of smoking and currently smoke or have quit within the past 15 years. Fecal occult blood test (FOBT) of the stool. You may have this test every year starting at age 70. Flexible sigmoidoscopy or colonoscopy. You may have a sigmoidoscopy every 5 years or a colonoscopy every 10 years starting at age 7. Hepatitis C blood test. Hepatitis B blood test. Sexually transmitted disease (STD) testing. Diabetes screening. This is done by checking your blood sugar (glucose) after you have not eaten for a while (fasting). You may have this done every 1-3 years. Bone density scan. This is done to screen for osteoporosis. You may have this done starting at age 63. Mammogram. This may be done every 1-2 years. Talk to your health care provider about how often you should have regular mammograms. Talk with your health care provider about your test results, treatment options, and if necessary, the need for more tests. Vaccines  Your health care provider may recommend certain vaccines, such as: Influenza vaccine. This is recommended every year. Tetanus, diphtheria, and acellular pertussis (Tdap, Td) vaccine. You may need a Td booster every 10 years. Zoster vaccine. You may need this after age 90. Pneumococcal 13-valent conjugate (PCV13) vaccine. One dose is recommended after age 62. Pneumococcal polysaccharide (PPSV23) vaccine. One dose is recommended after age 75. Talk to your health care provider about which screenings and vaccines you need and how often you need  them. This information is not intended to replace advice given to you by  your health care provider. Make sure you discuss any questions you have with your health care provider. Document Released: 04/11/2015 Document Revised: 12/03/2015 Document Reviewed: 01/14/2015 Elsevier Interactive Patient Education  2017 [employer].  Fall Prevention in the Home Falls can cause injuries. They can happen to people of all ages. There are many things you can do to make your home safe and to help prevent falls. What can I do on the outside of my home? Regularly fix the edges of walkways and driveways and fix any cracks. Remove anything that might make you trip as you walk through a door, such as a raised step or threshold. Trim any bushes or trees on the path to your home. Use bright outdoor lighting. Clear any walking paths of anything that might make someone trip, such as rocks or tools. Regularly check to see if handrails are loose or broken. Make sure that both sides of any steps have handrails. Any raised decks and porches should have guardrails on the edges. Have any leaves, snow, or ice cleared regularly. Use sand or salt on walking paths during winter. Clean up any spills in your garage right away. This includes oil or grease spills. What can I do in the bathroom? Use night lights. Install grab bars by the toilet and in the tub and shower. Do not use towel bars as grab bars. Use non-skid mats or decals in the tub or shower. If you need to sit down in the shower, use a plastic, non-slip stool. Keep the floor dry. Clean up any water that spills on the floor as soon as it happens. Remove soap buildup in the tub or shower regularly. Attach bath mats securely with double-sided non-slip rug tape. Do not have throw rugs and other things on the floor that can make you trip. What can I do in the bedroom? Use night lights. Make sure that you have a light by your bed that is easy to reach. Do not use any sheets or blankets that are too big for your bed. They should not hang  down onto the floor. Have a firm chair that has side arms. You can use this for support while you get dressed. Do not have throw rugs and other things on the floor that can make you trip. What can I do in the kitchen? Clean up any spills right away. Avoid walking on wet floors. Keep items that you use a lot in easy-to-reach places. If you need to reach something above you, use a strong step stool that has a grab bar. Keep electrical cords out of the way. Do not use floor polish or wax that makes floors slippery. If you must use wax, use non-skid floor wax. Do not have throw rugs and other things on the floor that can make you trip. What can I do with my stairs? Do not leave any items on the stairs. Make sure that there are handrails on both sides of the stairs and use them. Fix handrails that are broken or loose. Make sure that handrails are as long as the stairways. Check any carpeting to make sure that it is firmly attached to the stairs. Fix any carpet that is loose or worn. Avoid having throw rugs at the top or bottom of the stairs. If you do have throw rugs, attach them to the floor with carpet tape. Make sure that you have a light switch at  the top of the stairs and the bottom of the stairs. If you do not have them, ask someone to add them for you. What else can I do to help prevent falls? Wear shoes that: Do not have high heels. Have rubber bottoms. Are comfortable and fit you well. Are closed at the toe. Do not wear sandals. If you use a stepladder: Make sure that it is fully opened. Do not climb a closed stepladder. Make sure that both sides of the stepladder are locked into place. Ask someone to hold it for you, if possible. Clearly mark and make sure that you can see: Any grab bars or handrails. First and last steps. Where the edge of each step is. Use tools that help you move around (mobility aids) if they are needed. These  include: Canes. Walkers. Scooters. Crutches. Turn on the lights when you go into a dark area. Replace any light bulbs as soon as they burn out. Set up your furniture so you have a clear path. Avoid moving your furniture around. If any of your floors are uneven, fix them. If there are any pets around you, be aware of where they are. Review your medicines with your doctor. Some medicines can make you feel dizzy. This can increase your chance of falling. Ask your doctor what other things that you can do to help prevent falls. This information is not intended to replace advice given to you by your health care provider. Make sure you discuss any questions you have with your health care provider. Document Released: 01/09/2009 Document Revised: 08/21/2015 Document Reviewed: 04/19/2014 Elsevier Interactive Patient Education  2017 [employer].

## 2022-10-27 NOTE — Progress Notes (Signed)
Subjective:   Angel Waller is a 77 y.o. female who presents for Medicare Annual (Subsequent) preventive examination.  Visit Complete: Virtual  I connected with  Angel Waller on 10/27/22 by a audio enabled telemedicine application and verified that I am speaking with the correct person using two identifiers.  Patient Location: Home  Provider Location: Home Office  I discussed the limitations of evaluation and management by telemedicine. The patient expressed understanding and agreed to proceed.    Unable to obtain Vitals Telehealth visit  Review of Systems     Cardiac Risk Factors include: advanced age (>53men, >43 women);hypertension;family history of premature cardiovascular disease     Objective:    There were no vitals filed for this visit. There is no height or weight on file to calculate BMI.     10/27/2022    8:34 AM 04/22/2022    6:44 PM 12/30/2021   10:54 AM 12/29/2020    1:09 PM 12/24/2019   12:50 PM 11/10/2018    3:40 AM 11/09/2018    9:52 PM  Advanced Directives  Does Patient Have a Medical Advance Directive? Yes No Yes Yes Yes Yes No  Type of Estate agent of [employer] Power of [place];Living will Healthcare Power of [employer] of [place];Living will Healthcare Power of [place];Living will   Does patient want to make changes to medical advance directive?    No - Patient declined  No - Patient declined   Copy of Healthcare Power of Attorney in Chart? No - copy requested  No - copy requested Yes - validated most recent copy scanned in chart (See row information) No - copy requested No - copy requested   Would patient like information on creating a medical advance directive?      No - Patient declined     Current Medications (verified) Outpatient Encounter Medications as of 10/27/2022  Medication Sig   acetaminophen (TYLENOL) 500 MG tablet Take 1,000 mg by mouth as needed for moderate pain.   apixaban (ELIQUIS)  5 MG TABS tablet Take 5 mg by mouth 2 (two) times daily.   atorvastatin (LIPITOR) 40 MG tablet TAKE 1 TABLET BY MOUTH DAILY (Patient taking differently: Take 40 mg by mouth daily.)   Cholecalciferol 25 MCG (1000 UT) capsule Take 1,000 Units by mouth daily.   clindamycin (CLEOCIN) 300 MG capsule For dental appt. Only used for dental appointments   losartan (COZAAR) 25 MG tablet Take 1 tablet (25 mg total) by mouth daily.   omega-3 acid ethyl esters (LOVAZA) 1 g capsule Take 1 capsule by mouth 2 (two) times daily.   torsemide (DEMADEX) 10 MG tablet Take 10 mg by mouth daily.    tretinoin microspheres (RETIN-A MICRO) 0.1 % gel Apply 1 Application topically at bedtime.   No facility-administered encounter medications on file as of 10/27/2022.    Allergies (verified) Amoxicillin   History: Past Medical History:  Diagnosis Date   Allergy    Arthritis    "right knee" (02/29/2012)   Blood transfusion without reported diagnosis    Complete heart block (HCC)    Heart murmur    Hyperlipemia    Migraines    "occasionally" (02/29/2012)   Osteopenia    Pacemaker    S/P AVR (aortic valve replacement)    July 2010   Seasonal allergies    Past Surgical History:  Procedure Laterality Date   ABDOMINAL HYSTERECTOMY     CARDIAC VALVE REPLACEMENT  2010  tissue aortic valve; [facility]   COLONOSCOPY  2005   negative; [place], [place]   INSERT / REPLACE / REMOVE PACEMAKER     KNEE ARTHROSCOPY Right    PERMANENT PACEMAKER INSERTION  03/02/2012   Medtronic Adapta L implanted by Dr Johney Frame   PERMANENT PACEMAKER INSERTION N/A 03/02/2012   Procedure: PERMANENT PACEMAKER INSERTION;  Surgeon: Hillis Range, MD;  Location: [facility] CATH LAB;  Service: Cardiovascular;  Laterality: N/A;   POLYPECTOMY     TEMPORARY PACEMAKER INSERTION N/A 03/01/2012   Procedure: TEMPORARY PACEMAKER INSERTION;  Surgeon: Kathleene Hazel, MD;  Location: [facility] CATH LAB;  Service: Cardiovascular;  Laterality: N/A;   TONSILLECTOMY AND  ADENOIDECTOMY  1962   TOTAL KNEE ARTHROPLASTY Right 01/07/2014   Procedure: RIGHT TOTAL KNEE ARTHROPLASTY;  Surgeon: Shelda Pal, MD;  Location: [facility] ORS;  Service: Orthopedics;  Laterality: Right;   Family History  Problem Relation Age of Onset   Heart disease Mother    Heart attack Mother 23       CABG   Heart disease Father    Heart attack Father 79       aneurysm post MI   Colon polyps Father    Heart disease Other        in both M & P uncles & aunts; no premature MI   Diabetes Neg Hx    Stroke Neg Hx    Colon cancer Neg Hx    Pancreatic cancer Neg Hx    Rectal cancer Neg Hx    Stomach cancer Neg Hx    Social History   Socioeconomic History   Marital status: Married    Spouse name: Not on file   Number of children: Not on file   Years of education: Not on file   Highest education level: Not on file  Occupational History   Occupation: retired  Tobacco Use   Smoking status: Former    Current packs/day: 0.00    Average packs/day: 0.5 packs/day for 42.0 years (21.0 ttl pk-yrs)    Types: Cigarettes    Start date: 03/29/1965    Quit date: 03/30/2007    Years since quitting: 15.5   Smokeless tobacco: Never   Tobacco comments:    smoked ages [age], up to 1 pp WEEK  Vaping Use   Vaping status: Never Used  Substance and Sexual Activity   Alcohol use: No   Drug use: No   Sexual activity: Yes  Other Topics Concern   Not on file  Social History Narrative   Not on file   Social Determinants of Health   Financial Resource Strain: Low Risk  (10/27/2022)   Overall Financial Resource Strain (CARDIA)    Difficulty of Paying Living Expenses: Not hard at all  Food Insecurity: No Food Insecurity (10/27/2022)   Hunger Vital Sign    Worried About Running Out of Food in the Last Year: Never true    Ran Out of Food in the Last Year: Never true  Transportation Needs: No Transportation Needs (10/27/2022)   PRAPARE - Administrator, Civil Service (Medical): No    Lack of  Transportation (Non-Medical): No  Physical Activity: Insufficiently Active (10/27/2022)   Exercise Vital Sign    Days of Exercise per Week: 4 days    Minutes of Exercise per Session: 20 min  Stress: No Stress Concern Present (10/27/2022)   [employer] of Occupational Health - Occupational Stress Questionnaire    Feeling of Stress : Not at all  Social Connections: Moderately Integrated (10/27/2022)   Social Connection and Isolation Panel [NHANES]    Frequency of Communication with Friends and Family: More than three times a week    Frequency of Social Gatherings with Friends and Family: More than three times a week    Attends Religious Services: More than 4 times per year    Active Member of [employer] or Organizations: No    Attends Banker Meetings: Never    Marital Status: Married    Tobacco Counseling Counseling given: Not Answered Tobacco comments: smoked ages [age], up to 1 pp WEEK   Clinical Intake:  Pre-visit preparation completed: Yes  Pain : No/denies pain     Diabetes: No  How often do you need to have someone help you when you read instructions, pamphlets, or other written materials from your doctor or pharmacy?: 1 - Never  Interpreter Needed?: No  Information entered by :: Remi Haggard LPN   Activities of Daily Living    10/27/2022    8:36 AM 04/23/2022   10:52 AM  In your present state of health, do you have any difficulty performing the following activities:  Hearing? 0 0  Vision? 0 0  Difficulty concentrating or making decisions? 0 0  Walking or climbing stairs? 0 0  Dressing or bathing? 0 0  Doing errands, shopping? 0 0  Preparing Food and eating ? N   Using the Toilet? N   In the past six months, have you accidently leaked urine? Y   Do you have problems with loss of bowel control? N   Managing your Medications? N   Managing your Finances? N     Patient Care Team: Sheliah Hatch, MD as PCP - General (Family Medicine) Hillis Range, MD (Inactive) as Consulting Physician (Cardiology) Louis Meckel, MD (Inactive) as Consulting Physician (Gastroenterology) Pleasant, Conception Chancy., MD as Referring Physician (Obstetrics and Gynecology) Adella Hare, MD as Referring Physician (Cardiology) [place], Amy, MD as Consulting Physician (Dermatology) Durene Romans, MD as Consulting Physician (Orthopedic Surgery)  Indicate any recent Medical Services you may have received from other than [facility] providers in the past year (date may be approximate).     Assessment:   This is a routine wellness examination for Larisha.  Hearing/Vision screen Hearing Screening - Comments:: No trouble hearing Vision Screening - Comments:: Carlynn Purl Up to date [facility] for cataract surgery  Dietary issues and exercise activities discussed:     Goals Addressed             This Visit's Progress    Increase physical activity         Depression Screen    10/27/2022    8:45 AM 07/30/2022    9:13 AM 04/28/2022   10:07 AM 01/29/2022    8:53 AM 12/30/2021   10:53 AM 07/23/2021    9:14 AM 01/09/2021    8:57 AM  PHQ 2/9 Scores  PHQ - 2 Score 0 0 0 0 0 0 0  PHQ- 9 Score 0 0 0 1  1 0    Fall Risk    10/27/2022    8:38 AM 07/30/2022    9:13 AM 04/28/2022   10:07 AM 01/29/2022    8:53 AM 12/30/2021   10:55 AM  Fall Risk   Falls in the past year? 0 0 0 0 0  Number falls in past yr: 0 0 0  0  Injury with Fall? 0 0 0  0  Risk for  fall due to :  No Fall Risks No Fall Risks No Fall Risks Impaired vision  Follow up Education provided;Falls evaluation completed;Falls prevention discussed Falls evaluation completed Falls evaluation completed Falls evaluation completed Falls prevention discussed    MEDICARE RISK AT HOME:  Medicare Risk at Home - 10/27/22 0838     Any stairs in or around the home? Yes    If so, are there any without handrails? No    Home free of loose throw rugs in walkways, pet beds, electrical cords, etc? Yes     Adequate lighting in your home to reduce risk of falls? Yes    Life alert? No    Use of a cane, walker or w/c? No    Grab bars in the bathroom? Yes    Shower chair or bench in shower? Yes    Elevated toilet seat or a handicapped toilet? Yes             TIMED UP AND GO:  Was the test performed?  No    Cognitive Function:    12/14/2017    8:25 AM  MMSE - Mini Mental State Exam  Orientation to time 5  Orientation to Place 5  Registration 3  Attention/ Calculation 5  Recall 3  Language- name 2 objects 2  Language- repeat 1  Language- follow 3 step command 3  Language- read & follow direction 1  Write a sentence 1  Copy design 1  Total score 30        10/27/2022    8:37 AM 12/30/2021   10:57 AM  6CIT Screen  What Year? 0 points 0 points  What month? 0 points 0 points  What time? 0 points 0 points  Count back from 20 0 points 0 points  Months in reverse 0 points 0 points  Repeat phrase 0 points 0 points  Total Score 0 points 0 points    Immunizations Immunization History  Administered Date(s) Administered   Fluad Quad(high Dose 65+) 12/20/2018, 12/25/2019, 01/09/2021   Influenza Split 03/02/2011   Influenza Whole 02/24/2010   Influenza, High Dose Seasonal PF 12/14/2017, 12/25/2019, 01/29/2022   Influenza,inj,Quad PF,6+ Mos 02/26/2014, 12/31/2014, 01/06/2016, 11/17/2016   Influenza-Unspecified 01/12/2012, 12/27/2012   PFIZER(Purple Top)SARS-COV-2 Vaccination 05/21/2019, 06/11/2019, 04/01/2020   Pneumococcal Conjugate-13 07/01/2014   Pneumococcal Polysaccharide-23 03/03/2012   Tdap 02/29/2012   Varicella 07/18/2013   Zoster Recombinant(Shingrix) 12/14/2016, 06/22/2017    TDAP status: Due, Education has been provided regarding the importance of this vaccine. Advised may receive this vaccine at local pharmacy or Health Dept. Aware to provide a copy of the vaccination record if obtained from local pharmacy or Health Dept. Verbalized acceptance and  understanding.  Flu Vaccine status: Up to date  Pneumococcal vaccine status: Up to date  Covid-19 vaccine status: Information provided on how to obtain vaccines.   Qualifies for Shingles Vaccine? No   Zostavax completed Yes   Shingrix Completed?: Yes  Screening Tests Health Maintenance  Topic Date Due   Lung Cancer Screening  Never done   INFLUENZA VACCINE  10/28/2022   Colonoscopy  05/04/2023   MAMMOGRAM  06/09/2023   Medicare Annual Wellness (AWV)  10/27/2023   Pneumonia Vaccine 82+ Years old  Completed   DEXA SCAN  Completed   Hepatitis C Screening  Completed   Zoster Vaccines- Shingrix  Completed   HPV VACCINES  Aged Out   DTaP/Tdap/Td  Discontinued   COVID-19 Vaccine  Discontinued    Health Maintenance  Health Maintenance  Due  Topic Date Due   Lung Cancer Screening  Never done    Colorectal cancer screening: No longer required.   Mammogram status: Completed  . Repeat every year  Bone Density  declined  Lung Cancer Screening: (Low Dose CT Chest recommended if Age 44-80 years, 20 pack-year currently smoking OR have quit w/in 15years.)   never done    Lung Cancer Screening Referral:   Additional Screening:  Hepatitis C Screening: does not qualify; Completed 2018  Vision Screening: Recommended annual ophthalmology exams for early detection of glaucoma and other disorders of the eye. Is the patient up to date with their annual eye exam?  Yes  Who is the provider or what is the name of the office in which the patient attends annual eye exams? [facility] If pt is not established with a provider, would they like to be referred to a provider to establish care? No .   Dental Screening: Recommended annual dental exams for proper oral hygiene    Community Resource Referral / Chronic Care Management: CRR required this visit?  No   CCM required this visit?  No     Plan:     I have personally reviewed and noted the following in the patient's chart:   Medical  and social history Use of alcohol, tobacco or illicit drugs  Current medications and supplements including opioid prescriptions. Patient is not currently taking opioid prescriptions. Functional ability and status Nutritional status Physical activity Advanced directives List of other physicians Hospitalizations, surgeries, and ER visits in previous 12 months Vitals Screenings to include cognitive, depression, and falls Referrals and appointments  In addition, I have reviewed and discussed with patient certain preventive protocols, quality metrics, and best practice recommendations. A written personalized care plan for preventive services as well as general preventive health recommendations were provided to patient.     Remi Haggard, LPN   2/53/6644   After Visit Summary: My chart   Nurse Notes:

## 2022-11-22 DIAGNOSIS — H2511 Age-related nuclear cataract, right eye: Secondary | ICD-10-CM | POA: Diagnosis not present

## 2022-11-23 DIAGNOSIS — H25042 Posterior subcapsular polar age-related cataract, left eye: Secondary | ICD-10-CM | POA: Diagnosis not present

## 2022-11-23 DIAGNOSIS — H2512 Age-related nuclear cataract, left eye: Secondary | ICD-10-CM | POA: Diagnosis not present

## 2022-11-23 DIAGNOSIS — H25012 Cortical age-related cataract, left eye: Secondary | ICD-10-CM | POA: Diagnosis not present

## 2022-12-06 DIAGNOSIS — H25042 Posterior subcapsular polar age-related cataract, left eye: Secondary | ICD-10-CM | POA: Diagnosis not present

## 2022-12-06 DIAGNOSIS — H2512 Age-related nuclear cataract, left eye: Secondary | ICD-10-CM | POA: Diagnosis not present

## 2022-12-06 DIAGNOSIS — H25012 Cortical age-related cataract, left eye: Secondary | ICD-10-CM | POA: Diagnosis not present

## 2022-12-07 DIAGNOSIS — L814 Other melanin hyperpigmentation: Secondary | ICD-10-CM | POA: Diagnosis not present

## 2022-12-07 DIAGNOSIS — D1801 Hemangioma of skin and subcutaneous tissue: Secondary | ICD-10-CM | POA: Diagnosis not present

## 2022-12-07 DIAGNOSIS — L821 Other seborrheic keratosis: Secondary | ICD-10-CM | POA: Diagnosis not present

## 2023-02-01 DIAGNOSIS — I4891 Unspecified atrial fibrillation: Secondary | ICD-10-CM | POA: Diagnosis not present

## 2023-02-01 DIAGNOSIS — I4719 Other supraventricular tachycardia: Secondary | ICD-10-CM | POA: Diagnosis not present

## 2023-02-01 DIAGNOSIS — Z950 Presence of cardiac pacemaker: Secondary | ICD-10-CM | POA: Diagnosis not present

## 2023-02-08 DIAGNOSIS — Z45018 Encounter for adjustment and management of other part of cardiac pacemaker: Secondary | ICD-10-CM | POA: Diagnosis not present

## 2023-02-08 DIAGNOSIS — I480 Paroxysmal atrial fibrillation: Secondary | ICD-10-CM | POA: Diagnosis not present

## 2023-02-08 DIAGNOSIS — E785 Hyperlipidemia, unspecified: Secondary | ICD-10-CM | POA: Diagnosis not present

## 2023-02-08 DIAGNOSIS — Z950 Presence of cardiac pacemaker: Secondary | ICD-10-CM | POA: Diagnosis not present

## 2023-02-08 DIAGNOSIS — I442 Atrioventricular block, complete: Secondary | ICD-10-CM | POA: Diagnosis not present

## 2023-02-08 DIAGNOSIS — Z8673 Personal history of transient ischemic attack (TIA), and cerebral infarction without residual deficits: Secondary | ICD-10-CM | POA: Diagnosis not present

## 2023-02-10 DIAGNOSIS — H02834 Dermatochalasis of left upper eyelid: Secondary | ICD-10-CM | POA: Diagnosis not present

## 2023-02-10 DIAGNOSIS — H00015 Hordeolum externum left lower eyelid: Secondary | ICD-10-CM | POA: Diagnosis not present

## 2023-02-10 DIAGNOSIS — Z961 Presence of intraocular lens: Secondary | ICD-10-CM | POA: Diagnosis not present

## 2023-02-11 ENCOUNTER — Telehealth: Payer: Self-pay

## 2023-02-11 ENCOUNTER — Encounter: Payer: Self-pay | Admitting: Family Medicine

## 2023-02-11 ENCOUNTER — Ambulatory Visit: Payer: Medicare PPO | Admitting: Family Medicine

## 2023-02-11 VITALS — BP 112/64 | HR 61 | Temp 97.8°F | Ht 62.5 in | Wt 135.2 lb

## 2023-02-11 DIAGNOSIS — Z0000 Encounter for general adult medical examination without abnormal findings: Secondary | ICD-10-CM

## 2023-02-11 DIAGNOSIS — H6122 Impacted cerumen, left ear: Secondary | ICD-10-CM

## 2023-02-11 DIAGNOSIS — I10 Essential (primary) hypertension: Secondary | ICD-10-CM

## 2023-02-11 DIAGNOSIS — Z23 Encounter for immunization: Secondary | ICD-10-CM | POA: Diagnosis not present

## 2023-02-11 LAB — HEPATIC FUNCTION PANEL
ALT: 17 U/L (ref 0–35)
AST: 21 U/L (ref 0–37)
Albumin: 3.9 g/dL (ref 3.5–5.2)
Alkaline Phosphatase: 83 U/L (ref 39–117)
Bilirubin, Direct: 0.1 mg/dL (ref 0.0–0.3)
Total Bilirubin: 0.5 mg/dL (ref 0.2–1.2)
Total Protein: 7.6 g/dL (ref 6.0–8.3)

## 2023-02-11 LAB — LIPID PANEL
Cholesterol: 136 mg/dL (ref 0–200)
HDL: 40.5 mg/dL (ref >39.00)
LDL Cholesterol: 79 mg/dL (ref 0–99)
NonHDL: 95.82
Total CHOL/HDL Ratio: 3
Triglycerides: 86 mg/dL (ref 0.0–149.0)
VLDL: 17.2 mg/dL (ref 0.0–40.0)

## 2023-02-11 LAB — BASIC METABOLIC PANEL
BUN: 24 mg/dL — ABNORMAL HIGH (ref 6–23)
CO2: 28 meq/L (ref 19–32)
Calcium: 9.7 mg/dL (ref 8.4–10.5)
Chloride: 104 meq/L (ref 96–112)
Creatinine, Ser: 0.95 mg/dL (ref 0.40–1.20)
GFR: 57.58 mL/min — ABNORMAL LOW (ref >60.00)
Glucose, Bld: 92 mg/dL (ref 70–99)
Potassium: 4.2 meq/L (ref 3.5–5.1)
Sodium: 139 meq/L (ref 135–145)

## 2023-02-11 LAB — CBC WITH DIFFERENTIAL/PLATELET
Basophils Absolute: 0 10*3/uL (ref 0.0–0.1)
Basophils Relative: 0.5 % (ref 0.0–3.0)
Eosinophils Absolute: 0.2 10*3/uL (ref 0.0–0.7)
Eosinophils Relative: 2.9 % (ref 0.0–5.0)
HCT: 34.5 % — ABNORMAL LOW (ref 36.0–46.0)
Hemoglobin: 11.4 g/dL — ABNORMAL LOW (ref 12.0–15.0)
Lymphocytes Relative: 29.3 % (ref 12.0–46.0)
Lymphs Abs: 1.8 10*3/uL (ref 0.7–4.0)
MCHC: 33 g/dL (ref 30.0–36.0)
MCV: 90.8 fL (ref 78.0–100.0)
Monocytes Absolute: 0.5 10*3/uL (ref 0.1–1.0)
Monocytes Relative: 8.3 % (ref 3.0–12.0)
Neutro Abs: 3.7 10*3/uL (ref 1.4–7.7)
Neutrophils Relative %: 59 % (ref 43.0–77.0)
Platelets: 190 10*3/uL (ref 150.0–400.0)
RBC: 3.8 Mil/uL — ABNORMAL LOW (ref 3.87–5.11)
RDW: 14.3 % (ref 11.5–15.5)
WBC: 6.2 10*3/uL (ref 4.0–10.5)

## 2023-02-11 LAB — TSH: TSH: 1.77 u[IU]/mL (ref 0.35–5.50)

## 2023-02-11 NOTE — Progress Notes (Signed)
Subjective:    Patient ID: Angel Waller, female    DOB: 02/13/1946, 77 y.o.   MRN: [id number]  HPI CPE- will get flu shot today.  UTD on colonoscopy, mammo, DEXA, PNA  Patient Care Team    Relationship Specialty Notifications Start End  Sheliah Hatch, MD PCP - General Family Medicine  08/03/13   Hillis Range, MD (Inactive) Consulting Physician Cardiology  07/01/14   Louis Meckel, MD (Inactive) Consulting Physician Gastroenterology  07/01/14   Pleasant, Conception Chancy., MD Referring Physician Obstetrics and Gynecology  07/03/15   Adella Hare, MD Referring Physician Cardiology  11/17/16   [place], Amy, MD Consulting Physician Dermatology  11/17/16   Durene Romans, MD Consulting Physician Orthopedic Surgery  12/14/17      Health Maintenance  Topic Date Due   Lung Cancer Screening  Never done   DTaP/Tdap/Td (2 - Td or Tdap) 02/28/2022   INFLUENZA VACCINE  10/28/2022   COVID-19 Vaccine (4 - 2023-24 season) 11/28/2022   Colonoscopy  05/04/2023   MAMMOGRAM  06/09/2023   Medicare Annual Wellness (AWV)  10/27/2023   Pneumonia Vaccine 36+ Years old  Completed   DEXA SCAN  Completed   Hepatitis C Screening  Completed   Zoster Vaccines- Shingrix  Completed   HPV VACCINES  Aged Out      Review of Systems Patient reports no vision/ hearing changes, adenopathy,fever, weight change,  persistant/recurrent hoarseness , swallowing issues, chest pain, palpitations, edema, persistant/recurrent cough, hemoptysis, dyspnea (rest/exertional/paroxysmal nocturnal), gastrointestinal bleeding (melena, rectal bleeding), abdominal pain, significant heartburn, bowel changes, GU symptoms (dysuria, hematuria, incontinence), Gyn symptoms (abnormal  bleeding, pain),  syncope, focal weakness, memory loss, numbness & tingling, skin/hair/nail changes, abnormal bruising or bleeding, anxiety, or depression.     Objective:   Physical Exam General Appearance:    Alert, cooperative, no distress, appears  stated age  Head:    Normocephalic, without obvious abnormality, atraumatic  Eyes:    PERRL, conjunctiva/corneas clear, EOM's intact both eyes  Ears:    L ear w/ cerumen impaction, R ear WNL  Nose:   Nares normal, septum midline, mucosa normal, no drainage    or sinus tenderness  Throat:   Lips, mucosa, and tongue normal; teeth and gums normal  Neck:   Supple, symmetrical, trachea midline, no adenopathy;    Thyroid: no enlargement/tenderness/nodules  Back:     Symmetric, no curvature, ROM normal, no CVA tenderness  Lungs:     Clear to auscultation bilaterally, respirations unlabored  Chest Wall:    No tenderness or deformity   Heart:    Regular rate and rhythm, S1 and S2 normal, no murmur, rub   or gallop  Breast Exam:    Deferred to mammo  Abdomen:     Soft, non-tender, bowel sounds active all four quadrants,    no masses, no organomegaly  Genitalia:    Deferred  Rectal:    Extremities:   Extremities normal, atraumatic, no cyanosis or edema  Pulses:   2+ and symmetric all extremities  Skin:   Skin color, texture, turgor normal, no rashes or lesions  Lymph nodes:   Cervical, supraclavicular, and axillary nodes normal  Neurologic:   CNII-XII intact, normal strength, sensation and reflexes    throughout          Assessment & Plan:   Cerumen impaction- new.  L ear.  Pt consented to removal.  Attempted unsuccessfully to use curette.  Ear was successfully irrigated.  Hearing immediately improved.  Pt tolerated w/o difficulty.

## 2023-02-11 NOTE — Assessment & Plan Note (Signed)
Pt's PE WNL w/ exception of L cerumen impaction.  UTD on mammo, DEXA, PNA.  Flu shot given today.  Check labs.  Anticipatory guidance provided.

## 2023-02-11 NOTE — Telephone Encounter (Signed)
-----   Message from Neena Rhymes sent at 02/11/2023  3:16 PM EST ----- Labs are stable and look good!  No changes at this time

## 2023-02-11 NOTE — Patient Instructions (Signed)
Follow up in 6 months to recheck BP and cholesterol We'll notify you of your lab results and make any changes if needed Keep up the good work on healthy diet and regular exercise- you look great!! Call with any questions or concerns Stay Safe!  Stay Healthy! Happy [place]ated Birthday!! Happy Holidays!!!

## 2023-02-11 NOTE — Assessment & Plan Note (Signed)
Chronic problem.  Excellent control.  Currently asymptomatic.  Check labs.  No anticipated med changes. 

## 2023-02-14 NOTE — Telephone Encounter (Signed)
Pt has reviewed via MyChart

## 2023-05-03 DIAGNOSIS — Z45018 Encounter for adjustment and management of other part of cardiac pacemaker: Secondary | ICD-10-CM | POA: Diagnosis not present

## 2023-05-10 ENCOUNTER — Encounter: Payer: Self-pay | Admitting: Gastroenterology

## 2023-05-27 DIAGNOSIS — Z950 Presence of cardiac pacemaker: Secondary | ICD-10-CM | POA: Diagnosis not present

## 2023-05-27 DIAGNOSIS — I442 Atrioventricular block, complete: Secondary | ICD-10-CM | POA: Diagnosis not present

## 2023-05-27 DIAGNOSIS — I2510 Atherosclerotic heart disease of native coronary artery without angina pectoris: Secondary | ICD-10-CM | POA: Diagnosis not present

## 2023-05-27 DIAGNOSIS — I480 Paroxysmal atrial fibrillation: Secondary | ICD-10-CM | POA: Diagnosis not present

## 2023-05-27 DIAGNOSIS — Z953 Presence of xenogenic heart valve: Secondary | ICD-10-CM | POA: Diagnosis not present

## 2023-05-27 DIAGNOSIS — E782 Mixed hyperlipidemia: Secondary | ICD-10-CM | POA: Diagnosis not present

## 2023-07-14 DIAGNOSIS — H5202 Hypermetropia, left eye: Secondary | ICD-10-CM | POA: Diagnosis not present

## 2023-07-14 DIAGNOSIS — H524 Presbyopia: Secondary | ICD-10-CM | POA: Diagnosis not present

## 2023-07-14 DIAGNOSIS — H52221 Regular astigmatism, right eye: Secondary | ICD-10-CM | POA: Diagnosis not present

## 2023-08-02 DIAGNOSIS — Z45018 Encounter for adjustment and management of other part of cardiac pacemaker: Secondary | ICD-10-CM | POA: Diagnosis not present

## 2023-08-11 ENCOUNTER — Encounter: Payer: Self-pay | Admitting: Family Medicine

## 2023-08-11 ENCOUNTER — Ambulatory Visit: Payer: Medicare PPO | Admitting: Family Medicine

## 2023-08-11 VITALS — BP 110/60 | HR 80 | Temp 98.0°F | Ht 62.5 in | Wt 132.0 lb

## 2023-08-11 DIAGNOSIS — I10 Essential (primary) hypertension: Secondary | ICD-10-CM | POA: Diagnosis not present

## 2023-08-11 DIAGNOSIS — E785 Hyperlipidemia, unspecified: Secondary | ICD-10-CM | POA: Diagnosis not present

## 2023-08-11 LAB — CBC WITH DIFFERENTIAL/PLATELET
Basophils Absolute: 0 10*3/uL (ref 0.0–0.1)
Basophils Relative: 0.6 % (ref 0.0–3.0)
Eosinophils Absolute: 0.2 10*3/uL (ref 0.0–0.7)
Eosinophils Relative: 3.3 % (ref 0.0–5.0)
HCT: 32.7 % — ABNORMAL LOW (ref 36.0–46.0)
Hemoglobin: 10.9 g/dL — ABNORMAL LOW (ref 12.0–15.0)
Lymphocytes Relative: 28.8 % (ref 12.0–46.0)
Lymphs Abs: 1.8 10*3/uL (ref 0.7–4.0)
MCHC: 33.5 g/dL (ref 30.0–36.0)
MCV: 87.3 fl (ref 78.0–100.0)
Monocytes Absolute: 0.5 10*3/uL (ref 0.1–1.0)
Monocytes Relative: 8.6 % (ref 3.0–12.0)
Neutro Abs: 3.6 10*3/uL (ref 1.4–7.7)
Neutrophils Relative %: 58.7 % (ref 43.0–77.0)
Platelets: 180 10*3/uL (ref 150.0–400.0)
RBC: 3.74 Mil/uL — ABNORMAL LOW (ref 3.87–5.11)
RDW: 14.8 % (ref 11.5–15.5)
WBC: 6.2 10*3/uL (ref 4.0–10.5)

## 2023-08-11 LAB — BASIC METABOLIC PANEL WITH GFR
BUN: 30 mg/dL — ABNORMAL HIGH (ref 6–23)
CO2: 27 meq/L (ref 19–32)
Calcium: 9.7 mg/dL (ref 8.4–10.5)
Chloride: 103 meq/L (ref 96–112)
Creatinine, Ser: 0.86 mg/dL (ref 0.40–1.20)
GFR: 64.66 mL/min (ref >60.00)
Glucose, Bld: 90 mg/dL (ref 70–99)
Potassium: 4 meq/L (ref 3.5–5.1)
Sodium: 138 meq/L (ref 135–145)

## 2023-08-11 LAB — LIPID PANEL
Cholesterol: 136 mg/dL (ref 0–200)
HDL: 39.7 mg/dL (ref >39.00)
LDL Cholesterol: 78 mg/dL (ref 0–99)
NonHDL: 96.78
Total CHOL/HDL Ratio: 3
Triglycerides: 93 mg/dL (ref 0.0–149.0)
VLDL: 18.6 mg/dL (ref 0.0–40.0)

## 2023-08-11 LAB — HEPATIC FUNCTION PANEL
ALT: 10 U/L (ref 0–35)
AST: 18 U/L (ref 0–37)
Albumin: 3.9 g/dL (ref 3.5–5.2)
Alkaline Phosphatase: 73 U/L (ref 39–117)
Bilirubin, Direct: 0.1 mg/dL (ref 0.0–0.3)
Total Bilirubin: 0.5 mg/dL (ref 0.2–1.2)
Total Protein: 7.6 g/dL (ref 6.0–8.3)

## 2023-08-11 LAB — TSH: TSH: 1.94 u[IU]/mL (ref 0.35–5.50)

## 2023-08-11 NOTE — Patient Instructions (Signed)
Schedule your complete physical in 6 months We'll notify you of your lab results and make any changes if needed Keep up the good work on healthy diet and regular exercise- you look great!!! Call with any questions or concerns Stay Safe!  Stay Healthy! Have a great summer!!! 

## 2023-08-11 NOTE — Progress Notes (Signed)
   Subjective:    Patient ID: Angel Waller, female    DOB: Aug 12, 1945, 78 y.o.   MRN: [id number]  HPI Hyperlipidemia- chronic problem, on Lipitor 40mg  daily.  No abd pain, N/V.  HTN- chronic problem, on Losartan  25mg  daily.  Excellent control today.  No CP, SOB, HA's, visual changes, edema.   Review of Systems For ROS see HPI     Objective:   Physical Exam Vitals reviewed.  Constitutional:      General: She is not in acute distress.    Appearance: Normal appearance. She is well-developed. She is not ill-appearing.  HENT:     Head: Normocephalic and atraumatic.  Eyes:     Conjunctiva/sclera: Conjunctivae normal.     Pupils: Pupils are equal, round, and reactive to light.  Neck:     Thyroid : No thyromegaly.  Cardiovascular:     Rate and Rhythm: Normal rate and regular rhythm.     Pulses: Normal pulses.     Heart sounds: Normal heart sounds. No murmur heard. Pulmonary:     Effort: Pulmonary effort is normal. No respiratory distress.     Breath sounds: Normal breath sounds.  Abdominal:     General: There is no distension.     Palpations: Abdomen is soft.     Tenderness: There is no abdominal tenderness.  Musculoskeletal:     Cervical back: Normal range of motion and neck supple.     Right lower leg: No edema.     Left lower leg: No edema.  Lymphadenopathy:     Cervical: No cervical adenopathy.  Skin:    General: Skin is warm and dry.  Neurological:     General: No focal deficit present.     Mental Status: She is alert and oriented to person, place, and time.  Psychiatric:        Mood and Affect: Mood normal.        Behavior: Behavior normal.        Thought Content: Thought content normal.           Assessment & Plan:

## 2023-08-12 ENCOUNTER — Ambulatory Visit: Payer: Self-pay | Admitting: Family Medicine

## 2023-08-12 NOTE — Telephone Encounter (Signed)
-----   Message from Laymon Priest sent at 08/12/2023  7:30 AM EDT ----- Labs look good w/ exception of mildly low hemoglobin (blood count).  Please make sure you are taking a daily iron supplement or multivitamin w/ iron.

## 2023-08-12 NOTE — Telephone Encounter (Signed)
 Pt has reviewed labs via MyChart

## 2023-08-14 NOTE — Assessment & Plan Note (Signed)
 Chronic problem.  On Lipitor 40mg  daily w/o difficulty.  Check labs.  Adjust meds prn  ?

## 2023-08-14 NOTE — Assessment & Plan Note (Signed)
 Chronic problem.  Currently well controlled on Losartan  25mg  daily.  Asymptomatic.  Check labs due to ARB use but no anticipated med changes.

## 2023-09-06 DIAGNOSIS — Z01419 Encounter for gynecological examination (general) (routine) without abnormal findings: Secondary | ICD-10-CM | POA: Diagnosis not present

## 2023-09-06 DIAGNOSIS — Z1231 Encounter for screening mammogram for malignant neoplasm of breast: Secondary | ICD-10-CM | POA: Diagnosis not present

## 2023-11-01 DIAGNOSIS — I472 Ventricular tachycardia, unspecified: Secondary | ICD-10-CM | POA: Diagnosis not present

## 2023-11-01 DIAGNOSIS — Z950 Presence of cardiac pacemaker: Secondary | ICD-10-CM | POA: Diagnosis not present

## 2023-11-29 DIAGNOSIS — E782 Mixed hyperlipidemia: Secondary | ICD-10-CM | POA: Diagnosis not present

## 2023-11-29 DIAGNOSIS — I442 Atrioventricular block, complete: Secondary | ICD-10-CM | POA: Diagnosis not present

## 2023-11-29 DIAGNOSIS — Z950 Presence of cardiac pacemaker: Secondary | ICD-10-CM | POA: Diagnosis not present

## 2023-11-29 DIAGNOSIS — I480 Paroxysmal atrial fibrillation: Secondary | ICD-10-CM | POA: Diagnosis not present

## 2023-11-29 DIAGNOSIS — I2510 Atherosclerotic heart disease of native coronary artery without angina pectoris: Secondary | ICD-10-CM | POA: Diagnosis not present

## 2023-11-29 DIAGNOSIS — Z953 Presence of xenogenic heart valve: Secondary | ICD-10-CM | POA: Diagnosis not present

## 2024-01-31 DIAGNOSIS — Z45018 Encounter for adjustment and management of other part of cardiac pacemaker: Secondary | ICD-10-CM | POA: Diagnosis not present

## 2024-02-07 DIAGNOSIS — I442 Atrioventricular block, complete: Secondary | ICD-10-CM | POA: Diagnosis not present

## 2024-02-07 DIAGNOSIS — Z950 Presence of cardiac pacemaker: Secondary | ICD-10-CM | POA: Diagnosis not present

## 2024-02-07 DIAGNOSIS — I480 Paroxysmal atrial fibrillation: Secondary | ICD-10-CM | POA: Diagnosis not present

## 2024-02-07 DIAGNOSIS — Z45018 Encounter for adjustment and management of other part of cardiac pacemaker: Secondary | ICD-10-CM | POA: Diagnosis not present

## 2024-02-08 DIAGNOSIS — Z8673 Personal history of transient ischemic attack (TIA), and cerebral infarction without residual deficits: Secondary | ICD-10-CM | POA: Diagnosis not present

## 2024-02-08 DIAGNOSIS — Z1331 Encounter for screening for depression: Secondary | ICD-10-CM | POA: Diagnosis not present

## 2024-02-08 DIAGNOSIS — Z09 Encounter for follow-up examination after completed treatment for conditions other than malignant neoplasm: Secondary | ICD-10-CM | POA: Diagnosis not present

## 2024-02-21 ENCOUNTER — Ambulatory Visit (INDEPENDENT_AMBULATORY_CARE_PROVIDER_SITE_OTHER): Admitting: Family Medicine

## 2024-02-21 ENCOUNTER — Encounter: Payer: Self-pay | Admitting: Family Medicine

## 2024-02-21 VITALS — BP 102/68 | HR 65 | Temp 97.9°F | Ht 62.0 in | Wt 131.1 lb

## 2024-02-21 DIAGNOSIS — Z0000 Encounter for general adult medical examination without abnormal findings: Secondary | ICD-10-CM | POA: Diagnosis not present

## 2024-02-21 DIAGNOSIS — Z23 Encounter for immunization: Secondary | ICD-10-CM | POA: Diagnosis not present

## 2024-02-21 DIAGNOSIS — I110 Hypertensive heart disease with heart failure: Secondary | ICD-10-CM | POA: Diagnosis not present

## 2024-02-21 LAB — HEPATIC FUNCTION PANEL
ALT: 11 U/L (ref 0–35)
AST: 19 U/L (ref 0–37)
Albumin: 3.9 g/dL (ref 3.5–5.2)
Alkaline Phosphatase: 68 U/L (ref 39–117)
Bilirubin, Direct: 0.1 mg/dL (ref 0.0–0.3)
Total Bilirubin: 0.5 mg/dL (ref 0.2–1.2)
Total Protein: 7.6 g/dL (ref 6.0–8.3)

## 2024-02-21 LAB — BASIC METABOLIC PANEL WITH GFR
BUN: 21 mg/dL (ref 6–23)
CO2: 30 meq/L (ref 19–32)
Calcium: 9.8 mg/dL (ref 8.4–10.5)
Chloride: 103 meq/L (ref 96–112)
Creatinine, Ser: 1.01 mg/dL (ref 0.40–1.20)
GFR: 53.11 mL/min — ABNORMAL LOW (ref >60.00)
Glucose, Bld: 86 mg/dL (ref 70–99)
Potassium: 4.1 meq/L (ref 3.5–5.1)
Sodium: 139 meq/L (ref 135–145)

## 2024-02-21 LAB — CBC WITH DIFFERENTIAL/PLATELET
Basophils Absolute: 0 K/uL (ref 0.0–0.1)
Basophils Relative: 0.3 % (ref 0.0–3.0)
Eosinophils Absolute: 0.2 K/uL (ref 0.0–0.7)
Eosinophils Relative: 3 % (ref 0.0–5.0)
HCT: 31.8 % — ABNORMAL LOW (ref 36.0–46.0)
Hemoglobin: 10.7 g/dL — ABNORMAL LOW (ref 12.0–15.0)
Lymphocytes Relative: 26.2 % (ref 12.0–46.0)
Lymphs Abs: 1.5 K/uL (ref 0.7–4.0)
MCHC: 33.6 g/dL (ref 30.0–36.0)
MCV: 87.7 fl (ref 78.0–100.0)
Monocytes Absolute: 0.5 K/uL (ref 0.1–1.0)
Monocytes Relative: 8.8 % (ref 3.0–12.0)
Neutro Abs: 3.5 K/uL (ref 1.4–7.7)
Neutrophils Relative %: 61.7 % (ref 43.0–77.0)
Platelets: 205 K/uL (ref 150.0–400.0)
RBC: 3.63 Mil/uL — ABNORMAL LOW (ref 3.87–5.11)
RDW: 14.8 % (ref 11.5–15.5)
WBC: 5.6 K/uL (ref 4.0–10.5)

## 2024-02-21 LAB — LIPID PANEL
Cholesterol: 126 mg/dL (ref 0–200)
HDL: 41.5 mg/dL (ref >39.00)
LDL Cholesterol: 63 mg/dL (ref 0–99)
NonHDL: 84.13
Total CHOL/HDL Ratio: 3
Triglycerides: 104 mg/dL (ref 0.0–149.0)
VLDL: 20.8 mg/dL (ref 0.0–40.0)

## 2024-02-21 LAB — TSH: TSH: 1.83 u[IU]/mL (ref 0.35–5.50)

## 2024-02-21 NOTE — Patient Instructions (Signed)
 Follow up in 6 months to recheck blood pressure and cholesterol We'll notify you of your lab results and make any changes if needed Keep up the good work on healthy diet and regular exercise- you look great! Call with any questions or concerns Stay Safe!  Stay Healthy! Happy Holidays!!

## 2024-02-21 NOTE — Assessment & Plan Note (Signed)
 Pt's PE WNL.  UTD on PNA, flu.  Requested mammo records. Check labs. Anticipatory guidance provided.

## 2024-02-21 NOTE — Progress Notes (Signed)
   Subjective:    Patient ID: Angel Waller, female    DOB: 06-20-1945, 78 y.o.   MRN: [id number]  HPI CPE- UTD on PNA, flu.  Requested mammo  Health Maintenance  Topic Date Due   DTaP/Tdap/Td (2 - Td or Tdap) 02/28/2022   Mammogram  06/09/2023   Medicare Annual Wellness (AWV)  10/27/2023   COVID-19 Vaccine (4 - 2025-26 season) 11/28/2023   Pneumococcal Vaccine: 50+ Years  Completed   Influenza Vaccine  Completed   Bone Density Scan  Completed   Hepatitis C Screening  Completed   Zoster Vaccines- Shingrix  Completed   Meningococcal B Vaccine  Aged Out   Colonoscopy  Discontinued    Patient Care Team    Relationship Specialty Notifications Start End  Mahlon Comer BRAVO, MD PCP - General Family Medicine  08/03/13   Kelsie Agent, MD (Inactive) Consulting Physician Cardiology  07/01/14   Debrah Lamar BIRCH, MD (Inactive) Consulting Physician Gastroenterology  07/01/14   Pleasant, Victory Eldonna Raddle., MD Referring Physician Obstetrics and Gynecology  07/03/15   Brenna Debby Sharper, MD Referring Physician Cardiology  11/17/16   Jordan, Amy, MD Consulting Physician Dermatology  11/17/16   Ernie Cough, MD Consulting Physician Orthopedic Surgery  12/14/17      Review of Systems Patient reports no vision/ hearing changes, adenopathy,fever, weight change,  persistant/recurrent hoarseness , swallowing issues, chest pain, palpitations, edema, persistant/recurrent cough, hemoptysis, dyspnea (rest/exertional/paroxysmal nocturnal), gastrointestinal bleeding (melena, rectal bleeding), abdominal pain, significant heartburn, bowel changes, GU symptoms (dysuria, hematuria, incontinence), Gyn symptoms (abnormal  bleeding, pain),  syncope, focal weakness, memory loss, numbness & tingling, skin/hair/nail changes, abnormal bruising or bleeding, anxiety, or depression.     Objective:   Physical Exam General Appearance:    Alert, cooperative, no distress, appears stated age  Head:    Normocephalic, without  obvious abnormality, atraumatic  Eyes:    PERRL, conjunctiva/corneas clear, EOM's intact both eyes  Ears:    Normal TM's and external ear canals, both ears  Nose:   Nares normal, septum midline, mucosa normal, no drainage    or sinus tenderness  Throat:   Lips, mucosa, and tongue normal; teeth and gums normal  Neck:   Supple, symmetrical, trachea midline, no adenopathy;    Thyroid : no enlargement/tenderness/nodules  Back:     Symmetric, no curvature, ROM normal, no CVA tenderness  Lungs:     Clear to auscultation bilaterally, respirations unlabored  Chest Wall:    No tenderness or deformity   Heart:    Regular rate and rhythm, S1 and S2 normal, no murmur, rub   or gallop  Breast Exam:    Deferred to GYN  Abdomen:     Soft, non-tender, bowel sounds active all four quadrants,    no masses, no organomegaly  Genitalia:    Deferred to GYN  Rectal:    Extremities:   Extremities normal, atraumatic, no cyanosis or edema  Pulses:   2+ and symmetric all extremities  Skin:   Skin color, texture, turgor normal, no rashes or lesions  Lymph nodes:   Cervical, supraclavicular, and axillary nodes normal  Neurologic:   CNII-XII intact, normal strength, sensation and reflexes    throughout          Assessment & Plan:

## 2024-02-22 ENCOUNTER — Ambulatory Visit: Payer: Self-pay | Admitting: Family Medicine

## 2024-02-22 NOTE — Progress Notes (Signed)
 Pt has been notified.

## 2024-08-21 ENCOUNTER — Ambulatory Visit: Admitting: Family Medicine
